# Patient Record
Sex: Female | Born: 1995 | Race: White | Hispanic: No | Marital: Married | Smoking: Never smoker
Health system: Southern US, Community
[De-identification: ages and names within clinical notes are randomized; demographics above are authoritative.]

## PROBLEM LIST (undated history)

## (undated) ENCOUNTER — Inpatient Hospital Stay: Payer: Self-pay

## (undated) DIAGNOSIS — R001 Bradycardia, unspecified: Secondary | ICD-10-CM

## (undated) DIAGNOSIS — F32A Depression, unspecified: Secondary | ICD-10-CM

## (undated) DIAGNOSIS — G43909 Migraine, unspecified, not intractable, without status migrainosus: Secondary | ICD-10-CM

## (undated) DIAGNOSIS — O2243 Hemorrhoids in pregnancy, third trimester: Secondary | ICD-10-CM

## (undated) DIAGNOSIS — F329 Major depressive disorder, single episode, unspecified: Secondary | ICD-10-CM

## (undated) DIAGNOSIS — L409 Psoriasis, unspecified: Secondary | ICD-10-CM

## (undated) DIAGNOSIS — F99 Mental disorder, not otherwise specified: Secondary | ICD-10-CM

## (undated) DIAGNOSIS — K219 Gastro-esophageal reflux disease without esophagitis: Secondary | ICD-10-CM

## (undated) DIAGNOSIS — O24419 Gestational diabetes mellitus in pregnancy, unspecified control: Secondary | ICD-10-CM

## (undated) HISTORY — DX: Psoriasis, unspecified: L40.9

## (undated) HISTORY — DX: Bradycardia, unspecified: R00.1

## (undated) HISTORY — PX: SHOULDER SURGERY: SHX246

## (undated) HISTORY — PX: INNER EAR SURGERY: SHX679

## (undated) HISTORY — DX: Migraine, unspecified, not intractable, without status migrainosus: G43.909

## (undated) HISTORY — DX: Gestational diabetes mellitus in pregnancy, unspecified control: O24.419

---
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
## 2013-03-21 ENCOUNTER — Emergency Department: Payer: Self-pay | Admitting: Emergency Medicine

## 2013-04-13 ENCOUNTER — Emergency Department: Payer: Self-pay | Admitting: Emergency Medicine

## 2013-04-13 LAB — URINALYSIS, COMPLETE
Specific Gravity: 1.024 (ref 1.003–1.030)
Squamous Epithelial: 2
WBC UR: 93 /HPF (ref 0–5)

## 2013-07-08 ENCOUNTER — Ambulatory Visit: Payer: Self-pay | Admitting: Nurse Practitioner

## 2013-08-09 ENCOUNTER — Emergency Department: Payer: Self-pay | Admitting: Emergency Medicine

## 2013-08-09 LAB — URINALYSIS, COMPLETE
BILIRUBIN, UR: NEGATIVE
BLOOD: NEGATIVE
Glucose,UR: NEGATIVE mg/dL (ref 0–75)
Ketone: NEGATIVE
Leukocyte Esterase: NEGATIVE
Nitrite: POSITIVE
PH: 5 (ref 4.5–8.0)
PROTEIN: NEGATIVE
RBC,UR: 1 /HPF (ref 0–5)
Specific Gravity: 1.019 (ref 1.003–1.030)
Squamous Epithelial: <1
WBC UR: 6 /HPF (ref 0–5)

## 2013-08-09 LAB — BASIC METABOLIC PANEL
Anion Gap: 6 — ABNORMAL LOW (ref 7–16)
BUN: 11 mg/dL (ref 7–18)
CO2: 29 mmol/L (ref 21–32)
Calcium, Total: 9.3 mg/dL (ref 9.0–10.7)
Chloride: 104 mmol/L (ref 98–107)
Creatinine: 0.83 mg/dL (ref 0.60–1.30)
EGFR (African American): >60
Glucose: 101 mg/dL — ABNORMAL HIGH (ref 65–99)
OSMOLALITY: 277 (ref 275–301)
POTASSIUM: 3.4 mmol/L — AB (ref 3.5–5.1)
SODIUM: 139 mmol/L (ref 136–145)

## 2013-08-10 LAB — CBC
HCT: 43.2 % (ref 35.0–47.0)
HGB: 14.5 g/dL (ref 12.0–16.0)
MCH: 30.2 pg (ref 26.0–34.0)
MCHC: 33.6 g/dL (ref 32.0–36.0)
MCV: 90 fL (ref 80–100)
PLATELETS: 205 10*3/uL (ref 150–440)
RBC: 4.81 10*6/uL (ref 3.80–5.20)
RDW: 13 % (ref 11.5–14.5)
WBC: 10 10*3/uL (ref 3.6–11.0)

## 2013-08-10 LAB — PREGNANCY, URINE: PREGNANCY TEST, URINE: NEGATIVE m[IU]/mL

## 2013-08-11 LAB — URINE CULTURE

## 2014-04-07 ENCOUNTER — Ambulatory Visit (INDEPENDENT_AMBULATORY_CARE_PROVIDER_SITE_OTHER): Payer: Self-pay | Admitting: Urgent Care

## 2014-04-07 VITALS — BP 112/80 | HR 80 | Temp 98.0°F | Resp 16 | Ht 66.5 in | Wt 179.5 lb

## 2014-04-07 DIAGNOSIS — Z23 Encounter for immunization: Secondary | ICD-10-CM

## 2014-04-07 NOTE — Progress Notes (Signed)
    MRN: [id number] DOB: 12/13/1995  Subjective:   Isabel Higgins is a 19 y.o. female presenting for immunizations for college. Patient states that her school is requiring we fax her immunization record to them, needs her TDap update, last recorded TDap in 2000. Patient has not had meningococcal or Gardasil. Denies any other aggravating or relieving factors, no other questions or concerns.  Isabel Higgins has a current medication list which includes the following prescription(s): ibuprofen and levonorgestrel-ethinyl estradiol.  She is allergic to amoxicillin.  Isabel Higgins  has a past medical history of Bradycardia and Psoriasis. Also  has past surgical history that includes Inner ear surgery and Shoulder surgery.  ROS As in subjective.  Objective:   Vitals: BP 112/80 mmHg  Pulse 80  Temp(Src) 98 F (36.7 C) (Oral)  Resp 16  Ht 5' 6.5" (1.689 m)  Wt 179 lb 8 oz (81.421 kg)  BMI 28.54 kg/m2  SpO2 100%  LMP 03/30/2014  Physical Exam  Constitutional: She is oriented to person, place, and time and well-developed, well-nourished, and in no distress.  Cardiovascular: Normal rate.   Pulmonary/Chest: Effort normal. No respiratory distress.  Neurological: She is alert and oriented to person, place, and time.  Psychiatric: Mood and affect normal.   Assessment and Plan :   1. Need for Tdap vaccination - TDap administered, advised to have meningococcal and Gardasil done through her school or the health department due to financial restraints. - follow up as needed  Wallis BambergMario Jerre Vandrunen, PA-C [facility] [phone] 04/07/2014 6:23 PM

## 2014-04-07 NOTE — Patient Instructions (Addendum)
- Please look into getting the Meningococcal vaccine to help prevent meningitis and also the Gardasil vaccine to help prevent HPV infection through your school or the Health Department.   Tdap Vaccine (Tetanus, Diphtheria, Pertussis): What You Need to Know 1. Why get vaccinated? Tetanus, diphtheria and pertussis can be very serious diseases, even for adolescents and adults. Tdap vaccine can protect us from these diseases. TETANUS (Lockjaw) causes painful muscle tightening and stiffness, usually all over the body.  It can lead to tightening of muscles in the head and neck so you can't open your mouth, swallow, or sometimes even breathe. Tetanus kills about 1 out of 5 people who are infected. DIPHTHERIA can cause a thick coating to form in the back of the throat.  It can lead to breathing problems, paralysis, heart failure, and death. PERTUSSIS (Whooping Cough) causes severe coughing spells, which can cause difficulty breathing, vomiting and disturbed sleep.  It can also lead to weight loss, incontinence, and rib fractures. Up to 2 in 100 adolescents and 5 in 100 adults with pertussis are hospitalized or have complications, which could include pneumonia or death. These diseases are caused by bacteria. Diphtheria and pertussis are spread from person to person through coughing or sneezing. Tetanus enters the body through cuts, scratches, or wounds. Before vaccines, the [place] saw as many as 200,000 cases a year of diphtheria and pertussis, and hundreds of cases of tetanus. Since vaccination began, tetanus and diphtheria have dropped by about 99% and pertussis by about 80%. 2. Tdap vaccine Tdap vaccine can protect adolescents and adults from tetanus, diphtheria, and pertussis. One dose of Tdap is routinely given at age 21 or 9612. People who did not get Tdap at that age should get it as soon as possible. Tdap is especially important for health care professionals and anyone having close contact  with a baby younger than 12 months. Pregnant women should get a dose of Tdap during every pregnancy, to protect the newborn from pertussis. Infants are most at risk for severe, life-threatening complications from pertussis. A similar vaccine, called Td, protects from tetanus and diphtheria, but not pertussis. A Td booster should be given every 10 years. Tdap may be given as one of these boosters if you have not already gotten a dose. Tdap may also be given after a severe cut or burn to prevent tetanus infection. Your doctor can give you more information. Tdap may safely be given at the same time as other vaccines. 3. Some people should not get this vaccine  If you ever had a life-threatening allergic reaction after a dose of any tetanus, diphtheria, or pertussis containing vaccine, OR if you have a severe allergy to any part of this vaccine, you should not get Tdap. Tell your doctor if you have any severe allergies.  If you had a coma, or long or multiple seizures within 7 days after a childhood dose of DTP or DTaP, you should not get Tdap, unless a cause other than the vaccine was found. You can still get Td.  Talk to your doctor if you:  have epilepsy or another nervous system problem,  had severe pain or swelling after any vaccine containing diphtheria, tetanus or pertussis,  ever had Guillain-Barr Syndrome (GBS),  aren't feeling well on the day the shot is scheduled. 4. Risks of a vaccine reaction With any medicine, including vaccines, there is a chance of side effects. These are usually mild and go away on their own, but serious reactions are also  possible. Brief fainting spells can follow a vaccination, leading to injuries from falling. Sitting or lying down for about 15 minutes can help prevent these. Tell your doctor if you feel dizzy or light-headed, or have vision changes or ringing in the ears. Mild problems following Tdap (Did not interfere with activities)  Pain where the shot  was given (about 3 in 4 adolescents or 2 in 3 adults)  Redness or swelling where the shot was given (about 1 person in 5)  Mild fever of at least 100.26F (up to about 1 in 25 adolescents or 1 in 100 adults)  Headache (about 3 or 4 people in 10)  Tiredness (about 1 person in 3 or 4)  Nausea, vomiting, diarrhea, stomach ache (up to 1 in 4 adolescents or 1 in 10 adults)  Chills, body aches, sore joints, rash, swollen glands (uncommon) Moderate problems following Tdap (Interfered with activities, but did not require medical attention)  Pain where the shot was given (about 1 in 5 adolescents or 1 in 100 adults)  Redness or swelling where the shot was given (up to about 1 in 16 adolescents or 1 in 25 adults)  Fever over 102F (about 1 in 100 adolescents or 1 in 250 adults)  Headache (about 3 in 20 adolescents or 1 in 10 adults)  Nausea, vomiting, diarrhea, stomach ache (up to 1 or 3 people in 100)  Swelling of the entire arm where the shot was given (up to about 3 in 100). Severe problems following Tdap (Unable to perform usual activities; required medical attention)  Swelling, severe pain, bleeding and redness in the arm where the shot was given (rare). A severe allergic reaction could occur after any vaccine (estimated less than 1 in a million doses). 5. What if there is a serious reaction? What should I look for?  Look for anything that concerns you, such as signs of a severe allergic reaction, very high fever, or behavior changes. Signs of a severe allergic reaction can include hives, swelling of the face and throat, difficulty breathing, a fast heartbeat, dizziness, and weakness. These would start a few minutes to a few hours after the vaccination. What should I do?  If you think it is a severe allergic reaction or other emergency that can't wait, call 9-1-1 or get the person to the nearest hospital. Otherwise, call your doctor.  Afterward, the reaction should be reported to  the "Vaccine Adverse Event Reporting System" (VAERS). Your doctor might file this report, or you can do it yourself through the VAERS web site at www.vaers.LAgents.no, or by calling 1-630-866-5060. VAERS is only for reporting reactions. They do not give medical advice.  6. The National Vaccine Injury Compensation Program The [employer] Vaccine Injury Compensation Program (VICP) is a federal program that was created to compensate people who may have been injured by certain vaccines. Persons who believe they may have been injured by a vaccine can learn about the program and about filing a claim by calling 1-825-108-4442 or visiting the VICP website at SpiritualWord.at. 7. How can I learn more?  Ask your doctor.  Call your local or state health department.  Contact the Centers for Disease Control and Prevention (CDC):  Call 430-029-7801 or visit CDC's website at PicCapture.uy. CDC Tdap Vaccine VIS (08/01/11) Document Released: 09/10/2011 Document Revised: 07/26/2013 Document Reviewed: 06/23/2013 ExitCare Patient Information 2015 [place], [place]. This information is not intended to replace advice given to you by your health care provider. Make sure you discuss any questions you  have with your health care provider.  

## 2014-04-08 ENCOUNTER — Telehealth: Payer: Self-pay

## 2014-04-08 ENCOUNTER — Telehealth: Payer: Self-pay | Admitting: Urgent Care

## 2014-04-08 NOTE — Telephone Encounter (Signed)
Letter printed to fax to Jacki ConesLaurie at [phone]. Pt advised this has been corrected.

## 2014-04-08 NOTE — Telephone Encounter (Signed)
Error

## 2014-04-08 NOTE — Telephone Encounter (Signed)
Isabel Higgins with the immunization clinic at [facility] states that there may be a typo on the patient's immunization records that were faxed to their office yesterday. She is questioning the date of the patient's MMR vaccine. She states that she is going to fax it back to [place] for our review. Please advise.  [phone]

## 2014-04-08 NOTE — Telephone Encounter (Signed)
Pulled up immunization database [facility] and found that MMR was administered in 1996 not 1995- this was a clerical error on our end.   Pt number: [phone]     

## 2014-11-21 ENCOUNTER — Emergency Department
Admission: EM | Admit: 2014-11-21 | Discharge: 2014-11-21 | Disposition: A | Discharge status: Home / Self Care | Payer: BLUE CROSS/BLUE SHIELD | Attending: Emergency Medicine | Admitting: Emergency Medicine

## 2014-11-21 ENCOUNTER — Encounter: Payer: Self-pay | Admitting: Emergency Medicine

## 2014-11-21 ENCOUNTER — Emergency Department: Payer: BLUE CROSS/BLUE SHIELD

## 2014-11-21 DIAGNOSIS — Z880 Allergy status to penicillin: Secondary | ICD-10-CM | POA: Insufficient documentation

## 2014-11-21 DIAGNOSIS — S4992XA Unspecified injury of left shoulder and upper arm, initial encounter: Secondary | ICD-10-CM | POA: Diagnosis present

## 2014-11-21 DIAGNOSIS — Y9389 Activity, other specified: Secondary | ICD-10-CM | POA: Insufficient documentation

## 2014-11-21 DIAGNOSIS — Y998 Other external cause status: Secondary | ICD-10-CM | POA: Insufficient documentation

## 2014-11-21 DIAGNOSIS — S29002A Unspecified injury of muscle and tendon of back wall of thorax, initial encounter: Secondary | ICD-10-CM | POA: Diagnosis not present

## 2014-11-21 DIAGNOSIS — Y9289 Other specified places as the place of occurrence of the external cause: Secondary | ICD-10-CM | POA: Insufficient documentation

## 2014-11-21 DIAGNOSIS — S96912A Strain of unspecified muscle and tendon at ankle and foot level, left foot, initial encounter: Secondary | ICD-10-CM

## 2014-11-21 DIAGNOSIS — Z79899 Other long term (current) drug therapy: Secondary | ICD-10-CM | POA: Insufficient documentation

## 2014-11-21 DIAGNOSIS — S46912A Strain of unspecified muscle, fascia and tendon at shoulder and upper arm level, left arm, initial encounter: Secondary | ICD-10-CM | POA: Diagnosis not present

## 2014-11-21 MED ORDER — IBUPROFEN 800 MG PO TABS: 800.0000 mg | ORAL_TABLET | Freq: Three times a day (TID) | ORAL | Status: DC | PRN

## 2014-11-21 MED ORDER — IBUPROFEN 800 MG PO TABS
800.0000 mg | ORAL_TABLET | Freq: Once | ORAL | Status: AC
  Administered 2014-11-21: 800 mg via ORAL
  Filled 2014-11-21: qty 1

## 2014-11-21 MED ORDER — CYCLOBENZAPRINE HCL 10 MG PO TABS
5.0000 mg | ORAL_TABLET | Freq: Once | ORAL | Status: AC
  Administered 2014-11-21: 5 mg via ORAL
  Filled 2014-11-21: qty 1

## 2014-11-21 MED ORDER — CYCLOBENZAPRINE HCL 5 MG PO TABS: 5.0000 mg | ORAL_TABLET | Freq: Three times a day (TID) | ORAL | Status: DC | PRN
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
## 2014-11-27 ENCOUNTER — Encounter: Payer: Self-pay | Admitting: Medical Oncology

## 2014-11-27 ENCOUNTER — Emergency Department
Admission: EM | Admit: 2014-11-27 | Discharge: 2014-11-27 | Disposition: A | Discharge status: Home / Self Care | Payer: BLUE CROSS/BLUE SHIELD | Attending: Emergency Medicine | Admitting: Emergency Medicine

## 2014-11-27 ENCOUNTER — Emergency Department: Payer: BLUE CROSS/BLUE SHIELD

## 2014-11-27 DIAGNOSIS — N390 Urinary tract infection, site not specified: Secondary | ICD-10-CM | POA: Insufficient documentation

## 2014-11-27 DIAGNOSIS — Z880 Allergy status to penicillin: Secondary | ICD-10-CM | POA: Diagnosis not present

## 2014-11-27 DIAGNOSIS — Z79899 Other long term (current) drug therapy: Secondary | ICD-10-CM | POA: Insufficient documentation

## 2014-11-27 DIAGNOSIS — I880 Nonspecific mesenteric lymphadenitis: Secondary | ICD-10-CM | POA: Diagnosis not present

## 2014-11-27 DIAGNOSIS — R109 Unspecified abdominal pain: Secondary | ICD-10-CM | POA: Diagnosis present

## 2014-11-27 LAB — CBC
HCT: 43 % (ref 35.0–47.0)
Hemoglobin: 14.9 g/dL (ref 12.0–16.0)
MCH: 30.3 pg (ref 26.0–34.0)
MCHC: 34.7 g/dL (ref 32.0–36.0)
MCV: 87.5 fL (ref 80.0–100.0)
PLATELETS: 180 10*3/uL (ref 150–440)
RBC: 4.91 MIL/uL (ref 3.80–5.20)
RDW: 12.8 % (ref 11.5–14.5)
WBC: 4.9 10*3/uL (ref 3.6–11.0)

## 2014-11-27 LAB — URINALYSIS COMPLETE WITH MICROSCOPIC (ARMC ONLY)
BILIRUBIN URINE: NEGATIVE
GLUCOSE, UA: NEGATIVE mg/dL
HGB URINE DIPSTICK: NEGATIVE
LEUKOCYTES UA: NEGATIVE
NITRITE: NEGATIVE
Protein, ur: 30 mg/dL — AB
SPECIFIC GRAVITY, URINE: 1.026 (ref 1.005–1.030)
pH: 5 (ref 5.0–8.0)

## 2014-11-27 LAB — COMPREHENSIVE METABOLIC PANEL
ALT: 31 U/L (ref 14–54)
AST: 29 U/L (ref 15–41)
Albumin: 4.2 g/dL (ref 3.5–5.0)
Alkaline Phosphatase: 70 U/L (ref 38–126)
Anion gap: 7 (ref 5–15)
BILIRUBIN TOTAL: 1.2 mg/dL (ref 0.3–1.2)
BUN: 11 mg/dL (ref 6–20)
CO2: 23 mmol/L (ref 22–32)
CREATININE: 0.84 mg/dL (ref 0.44–1.00)
Calcium: 9 mg/dL (ref 8.9–10.3)
Chloride: 107 mmol/L (ref 101–111)
GFR calc Af Amer: >60 mL/min (ref >60)
Glucose, Bld: 105 mg/dL — ABNORMAL HIGH (ref 65–99)
POTASSIUM: 3.6 mmol/L (ref 3.5–5.1)
Sodium: 137 mmol/L (ref 135–145)
TOTAL PROTEIN: 7.2 g/dL (ref 6.5–8.1)

## 2014-11-27 LAB — LIPASE, BLOOD: Lipase: 17 U/L — ABNORMAL LOW (ref 22–51)

## 2014-11-27 MED ORDER — ONDANSETRON HCL 4 MG/2ML IJ SOLN
4.0000 mg | Freq: Once | INTRAMUSCULAR | Status: AC
  Administered 2014-11-27: 4 mg via INTRAVENOUS
  Filled 2014-11-27: qty 2

## 2014-11-27 MED ORDER — SULFAMETHOXAZOLE-TRIMETHOPRIM 800-160 MG PO TABS
1.0000 | ORAL_TABLET | Freq: Once | ORAL | Status: AC
  Administered 2014-11-27: 1 via ORAL
  Filled 2014-11-27: qty 1

## 2014-11-27 MED ORDER — IOHEXOL 300 MG/ML  SOLN
100.0000 mL | Freq: Once | INTRAMUSCULAR | Status: AC | PRN
  Administered 2014-11-27: 100 mL via INTRAVENOUS

## 2014-11-27 MED ORDER — MORPHINE SULFATE (PF) 4 MG/ML IV SOLN
4.0000 mg | Freq: Once | INTRAVENOUS | Status: AC
  Administered 2014-11-27: 4 mg via INTRAVENOUS
  Filled 2014-11-27: qty 1

## 2014-11-27 MED ORDER — BACITRACIN ZINC 500 UNIT/GM EX OINT
TOPICAL_OINTMENT | CUTANEOUS | Status: AC
  Filled 2014-11-27: qty 0.9
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
## 2014-11-27 NOTE — Discharge Instructions (Signed)
Antibiotic Medication °Antibiotics are among the most frequently prescribed medicines. Antibiotics cure illness by assisting our body to injure or kill the bacteria that cause infection. While antibiotics are useful to treat a wide variety of infections they are useless against viruses. Antibiotics cannot cure colds, flu, or other viral infections.  °There are many types of antibiotics available. Your caregiver will decide which antibiotic will be useful for an illness. Never take or give someone else's antibiotics or left over medicine. °Your caregiver may also take into account: °· Allergies. °· The cost of the medicine. °· Dosing schedules. °· Taste. °· Common side effects when choosing an antibiotic for an infection. °Ask your caregiver if you have questions about why a certain medicine was chosen. °HOME CARE INSTRUCTIONS °Read all instructions and labels on medicine bottles carefully. Some antibiotics should be taken on an empty stomach while others should be taken with food. Taking antibiotics incorrectly may reduce how well they work. Some antibiotics need to be kept in the refrigerator. Others should be kept at room temperature. Ask your caregiver or pharmacist if you do not understand how to give the medicine. °Be sure to give the amount of medicine your caregiver has prescribed. Even if you feel better and your symptoms improve, bacteria may still remain alive in the body. Taking all of the medicine will prevent: °· The infection from returning and becoming harder to treat. °· Complications from partially treated infections. °If there is any medicine left over after you have taken the medicine as your caregiver has instructed, throw the medicine away. °Be sure to tell your caregiver if you: °· Are allergic to any medicines. °· Are pregnant or intend to become pregnant while using this medicine. °· Are breastfeeding. °· Are taking any other prescription, non-prescription medicine, or herbal  remedies. °· Have any other medical conditions or problems you have not already discussed. °If you are taking birth control pills, they may not work while you are on antibiotics. To avoid unwanted pregnancy: °· Continue taking your birth control pills as usual. °· Use a second form of birth control (such as condoms) while you are taking antibiotic medicine. °· When you finish taking the antibiotic medicine, continue using the second form of birth control until you are finished with your current 1 month cycle of birth control pills. °Try not to miss any doses of medicine. If you miss a dose, take it as soon as possible. However, if it is almost time for the next dose and the dosing schedule is: °· 2 doses a day, take the missed dose and the next dose 5 to 6 hours apart. °· 3 or more doses a day, take the missed dose and the next dose 2 to 4 hours apart, then go back to the normal schedule. °· If you are unable to make up a missed dose, take the next scheduled dose on time and complete the missed dose at the end of the prescribed time for your medicine. °SIDE EFFECTS TO TAKING ANTIBIOTICS °Common side effects to antibiotic use include: °· Soft stools or diarrhea. °· Mild stomach upset. °· Sun sensitivity. °SEEK MEDICAL CARE IF:  °· If you get worse or do not improve within a few days of starting the medicine. °· Vomiting develops. °· Diaper rash or rash on the genitals appears. °· Vaginal itching occurs. °· White patches appear on the tongue or in the mouth. °· Severe watery diarrhea and abdominal cramps occur. °· Signs of an allergy develop (hives, unknown   itchy rash appears). STOP TAKING THE ANTIBIOTIC. °SEEK IMMEDIATE MEDICAL CARE IF:  °· Urine turns dark or blood colored. °· Skin turns yellow. °· Easy bruising or bleeding occurs. °· Joint pain or muscle aches occur. °· Fever returns. °· Severe headache occurs. °· Signs of an allergy develop (trouble breathing, wheezing, swelling of the lips, face or tongue,  fainting, or blisters on the skin or in the mouth). STOP TAKING THE ANTIBIOTIC. °Document Released: 11/22/2003 Document Revised: 06/03/2011 Document Reviewed: 12/01/2008 °[employer]® Patient Information ©2015 [employer]. This information is not intended to replace advice given to you by your health care provider. Make sure you discuss any questions you have with your health care provider. ° °
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
## 2014-11-29 LAB — URINE CULTURE
Culture: >=100000
Special Requests: NORMAL

## 2014-12-01 NOTE — Progress Notes (Signed)
Urine culture from ED growing Enterococcus faecalis for which Dr. Lenard Lance authorized Macrobid 100 mg bid x 7 days on 11/30/14. Attempted to call patient multiple times and left voicemail to determine which pharmacy to call the prescription into. No call back from patient.

## 2015-09-18 LAB — OB RESULTS CONSOLE ABO/RH: RH Type: POSITIVE

## 2015-09-19 LAB — OB RESULTS CONSOLE HEPATITIS B SURFACE ANTIGEN: Hepatitis B Surface Ag: NEGATIVE

## 2015-09-19 LAB — OB RESULTS CONSOLE VARICELLA ZOSTER ANTIBODY, IGG: Varicella: IMMUNE

## 2015-09-19 LAB — OB RESULTS CONSOLE HIV ANTIBODY (ROUTINE TESTING): HIV: NONREACTIVE

## 2015-09-19 LAB — OB RESULTS CONSOLE RUBELLA ANTIBODY, IGM: Rubella: IMMUNE

## 2015-09-19 LAB — OB RESULTS CONSOLE RPR: RPR: NONREACTIVE

## 2015-11-05 ENCOUNTER — Encounter: Payer: Self-pay | Admitting: Emergency Medicine

## 2015-11-05 ENCOUNTER — Emergency Department
Admission: EM | Admit: 2015-11-05 | Discharge: 2015-11-05 | Disposition: A | Discharge status: Home / Self Care | Payer: Medicaid Other | Attending: Emergency Medicine | Admitting: Emergency Medicine

## 2015-11-05 DIAGNOSIS — O26891 Other specified pregnancy related conditions, first trimester: Secondary | ICD-10-CM | POA: Diagnosis not present

## 2015-11-05 DIAGNOSIS — O219 Vomiting of pregnancy, unspecified: Secondary | ICD-10-CM | POA: Diagnosis not present

## 2015-11-05 DIAGNOSIS — R42 Dizziness and giddiness: Secondary | ICD-10-CM | POA: Insufficient documentation

## 2015-11-05 DIAGNOSIS — Z3A12 12 weeks gestation of pregnancy: Secondary | ICD-10-CM | POA: Diagnosis not present

## 2015-11-05 LAB — CBC
HEMATOCRIT: 42.1 % (ref 35.0–47.0)
HEMOGLOBIN: 14.8 g/dL (ref 12.0–16.0)
MCH: 31.5 pg (ref 26.0–34.0)
MCHC: 35.2 g/dL (ref 32.0–36.0)
MCV: 89.7 fL (ref 80.0–100.0)
Platelets: 199 10*3/uL (ref 150–440)
RBC: 4.69 MIL/uL (ref 3.80–5.20)
RDW: 12.8 % (ref 11.5–14.5)
WBC: 10.5 10*3/uL (ref 3.6–11.0)

## 2015-11-05 LAB — COMPREHENSIVE METABOLIC PANEL
ALT: 35 U/L (ref 14–54)
AST: 25 U/L (ref 15–41)
Albumin: 3.7 g/dL (ref 3.5–5.0)
Alkaline Phosphatase: 70 U/L (ref 38–126)
Anion gap: 5 (ref 5–15)
BUN: 7 mg/dL (ref 6–20)
CHLORIDE: 105 mmol/L (ref 101–111)
CO2: 25 mmol/L (ref 22–32)
CREATININE: 0.66 mg/dL (ref 0.44–1.00)
Calcium: 9.3 mg/dL (ref 8.9–10.3)
GFR calc non Af Amer: >60 mL/min (ref >60)
Glucose, Bld: 93 mg/dL (ref 65–99)
Potassium: 3.6 mmol/L (ref 3.5–5.1)
SODIUM: 135 mmol/L (ref 135–145)
Total Bilirubin: 0.3 mg/dL (ref 0.3–1.2)
Total Protein: 6.8 g/dL (ref 6.5–8.1)

## 2015-11-05 LAB — URINALYSIS COMPLETE WITH MICROSCOPIC (ARMC ONLY)
BILIRUBIN URINE: NEGATIVE
Glucose, UA: NEGATIVE mg/dL
HGB URINE DIPSTICK: NEGATIVE
Ketones, ur: NEGATIVE mg/dL
LEUKOCYTES UA: NEGATIVE
NITRITE: NEGATIVE
PH: 7 (ref 5.0–8.0)
Protein, ur: NEGATIVE mg/dL
SPECIFIC GRAVITY, URINE: 1.014 (ref 1.005–1.030)

## 2015-11-05 LAB — LIPASE, BLOOD: LIPASE: 20 U/L (ref 11–51)

## 2015-11-05 LAB — HCG, QUANTITATIVE, PREGNANCY: hCG, Beta Chain, Quant, S: 100210 m[IU]/mL — ABNORMAL HIGH (ref <5)

## 2015-11-05 MED ORDER — METOCLOPRAMIDE HCL 10 MG PO TABS: 10.0000 mg | ORAL_TABLET | Freq: Four times a day (QID) | ORAL | 0 refills | Status: DC | PRN

## 2015-11-05 MED ORDER — ONDANSETRON 8 MG PO TBDP
ORAL_TABLET | ORAL | Status: AC
  Administered 2015-11-05: 8 mg via ORAL
  Filled 2015-11-05: qty 1

## 2015-11-05 MED ORDER — FAMOTIDINE 20 MG PO TABS
ORAL_TABLET | ORAL | Status: AC
  Administered 2015-11-05: 40 mg via ORAL
  Filled 2015-11-05: qty 2

## 2015-11-05 MED ORDER — ONDANSETRON 4 MG PO TBDP: 4.0000 mg | ORAL_TABLET | Freq: Three times a day (TID) | ORAL | 0 refills | Status: DC | PRN

## 2015-11-05 MED ORDER — METOCLOPRAMIDE HCL 10 MG PO TABS
ORAL_TABLET | ORAL | Status: AC
  Administered 2015-11-05: 10 mg via ORAL
  Filled 2015-11-05: qty 1

## 2015-11-05 MED ORDER — FAMOTIDINE 20 MG PO TABS
40.0000 mg | ORAL_TABLET | Freq: Once | ORAL | Status: AC
  Administered 2015-11-05: 40 mg via ORAL

## 2015-11-05 MED ORDER — METOCLOPRAMIDE HCL 10 MG PO TABS
10.0000 mg | ORAL_TABLET | Freq: Once | ORAL | Status: AC
  Administered 2015-11-05: 10 mg via ORAL
  Filled 2015-11-05: qty 1

## 2015-11-05 MED ORDER — ONDANSETRON 4 MG PO TBDP
8.0000 mg | ORAL_TABLET | Freq: Once | ORAL | Status: AC
  Administered 2015-11-05: 8 mg via ORAL

## 2015-11-05 NOTE — ED Provider Notes (Signed)
[facility] Emergency Department Provider Note  ____________________________________________  Time seen: Approximately 6:12 PM  I have reviewed the triage vital signs and the nursing notes.   HISTORY  Chief Complaint Nausea and Emesis    HPI Isabel Higgins is a 20 y.o. female who is about [redacted] weeks pregnant by first trimester dating ultrasound performed by [facility] complains of oral intolerance for the past 3 days. She states that she is nauseated continuously whenever she tries to eat or drink anything she vomits. She's not been able to tolerate even water. She gets dizzy when she stands up and walks around. No syncope or falls or head injuries. She also notes that after vomiting she feels somewhat vertiginous. No vision changes numbness tingling or weakness. Denies medical problems or significant prior surgeries, only surgery 6 years and shoulder.. Taking prenatal vitamins. Follow up with [facility]. Has an appointment with them in 5 days for first trimester genetic screening.     Past Medical History:  Diagnosis Date  . Bradycardia   . Psoriasis      Patient Active Problem List   Diagnosis Date Noted  . Chest pain 03/06/2012  . POTS (postural orthostatic tachycardia syndrome) 03/06/2012     Past Surgical History:  Procedure Laterality Date  . INNER EAR SURGERY     right  . SHOULDER SURGERY     right     Prior to Admission medications   Medication Sig Start Date End Date Taking? Authorizing Provider  cyclobenzaprine (FLEXERIL) 5 MG tablet Take 1 tablet (5 mg total) by mouth every 8 (eight) hours as needed for muscle spasms. 11/21/14   Charmayne Sheer Beers, PA-C  ibuprofen (ADVIL,MOTRIN) 200 MG tablet Take 800 mg by mouth 3 (three) times daily as needed for mild pain or moderate pain.    Historical Provider, MD  ibuprofen (ADVIL,MOTRIN) 800 MG tablet Take 1 tablet (800 mg total) by mouth every 8 (eight) hours as needed. 11/21/14   Evangeline Dakin, PA-C  levonorgestrel-ethinyl estradiol (ORSYTHIA) 0.1-20 MG-MCG tablet Take 1 tablet by mouth daily.    Historical Provider, MD  metoCLOPramide (REGLAN) 10 MG tablet Take 1 tablet (10 mg total) by mouth every 6 (six) hours as needed for nausea. 11/05/15   Sharman Cheek, MD  ondansetron (ZOFRAN ODT) 4 MG disintegrating tablet Take 1 tablet (4 mg total) by mouth every 8 (eight) hours as needed for nausea or vomiting. 11/05/15   Sharman Cheek, MD     Allergies 2,4-d dimethylamine (amisol) and Amoxicillin   Family History  Problem Relation Age of Onset  . Hypertension Father   . Heart attack Maternal Grandmother     x5  . Diabetes Maternal Grandfather   . Diabetes Paternal Grandfather     Social History Social History  Substance Use Topics  . Smoking status: Never Smoker  . Smokeless tobacco: Never Used  . Alcohol use No    Review of Systems  Constitutional:   No fever or chills. Home thermometer remaining afebrile with multiple checks ENT:   No sore throat. No rhinorrhea. Cardiovascular:   No chest pain. Respiratory:   No dyspnea or cough. Gastrointestinal:   Negative for abdominal pain, positive vomiting.  Genitourinary:   No unusual vaginal bleeding or discharge. 2 weeks ago had some dysuria which has resolved.. Musculoskeletal:   Negative for focal pain or swelling Neurological:   Occasional bilateral frontal headaches, none now 10-point ROS otherwise negative.  ____________________________________________   PHYSICAL EXAM:  VITAL SIGNS: ED Triage Vitals [11/05/15 1755]  Enc Vitals Group     BP 129/66     Pulse Rate 89     Resp 18     Temp 98.1 F (36.7 C)     Temp Source Oral     SpO2 99 %     Weight 188 lb (85.3 kg)     Height 5\' 7"  (1.702 m)     Head Circumference      Peak Flow      Pain Score 0     Pain Loc      Pain Edu?      Excl. in GC?     Vital signs reviewed, nursing assessments reviewed.   Constitutional:   Alert and oriented.  Well appearing and in no distress. Eyes:   No scleral icterus. No conjunctival pallor. PERRL. EOMI.  No nystagmus. ENT   Head:   Normocephalic and atraumatic.   Nose:   No congestion/rhinnorhea. No septal hematoma   Mouth/Throat:   MMM, no pharyngeal erythema. No peritonsillar mass.    Neck:   No stridor. No SubQ emphysema. No meningismus. Hematological/Lymphatic/Immunilogical:   No cervical lymphadenopathy. Cardiovascular:   RRR. Symmetric bilateral radial and DP pulses.  No murmurs.  Respiratory:   Normal respiratory effort without tachypnea nor retractions. Breath sounds are clear and equal bilaterally. No wheezes/rales/rhonchi. Gastrointestinal:   Soft and nontender. Non distended. There is no CVA tenderness.  No rebound, rigidity, or guarding.Gravid abdomen, size consistent with dates. Nontender Genitourinary:   deferred Musculoskeletal:   Nontender with normal range of motion in all extremities. No joint effusions.  No lower extremity tenderness.  No edema. Neurologic:   Normal speech and language.  CN 2-10 normal. Motor grossly intact. No gross focal neurologic deficits are appreciated.  Skin:    Skin is warm, dry and intact. No rash noted.  No petechiae, purpura, or bullae.  ____________________________________________    LABS (pertinent positives/negatives) (all labs ordered are listed, but only abnormal results are displayed) Labs Reviewed  URINALYSIS COMPLETEWITH MICROSCOPIC ([facility] ONLY) - Abnormal; Notable for the following:       Result Value   Color, Urine YELLOW (*)    APPearance HAZY (*)    Bacteria, UA RARE (*)    Squamous Epithelial / LPF 0-5 (*)    All other components within normal limits  HCG, QUANTITATIVE, PREGNANCY - Abnormal; Notable for the following:    hCG, Beta Chain, Quant, S 100,210 (*)    All other components within normal limits  LIPASE, BLOOD  COMPREHENSIVE METABOLIC PANEL  CBC    ____________________________________________   EKG    ____________________________________________    RADIOLOGY    ____________________________________________   PROCEDURES Procedures  ____________________________________________   INITIAL IMPRESSION / ASSESSMENT AND PLAN / ED COURSE  Pertinent labs & imaging results that were available during my care of the patient were reviewed by me and considered in my medical decision making (see chart for details).  Patient well appearing no acute distress. Calm and comfortable. Exam is completely benign. We'll try antiemetics and oral hydration while awaiting labs. If she is unable to tolerate by mouth, we can proceed with IV fluid rehydration and relief.    ----------------------------------------- 7:50 PM on 11/05/2015 -----------------------------------------  Since receiving antiemetics, patient has been calm and comfortable watching TV and eating and drinking in the treatment room. He feels much better. We'll prescribe Zofran and Reglan, has appointment in 5 days with [facility], patient will continue to follow  up outpatient.   Clinical Course   ____________________________________________   FINAL CLINICAL IMPRESSION(S) / ED DIAGNOSES  Final diagnoses:  Nausea and vomiting of pregnancy, antepartum       Portions of this note were generated with dragon dictation software. Dictation errors may occur despite best attempts at proofreading.    Sharman CheekPhillip Arnesia Vincelette, MD 11/05/15 [phone]

## 2015-11-05 NOTE — ED Triage Notes (Signed)
Patient arrives from home via POV with complaint of N/V for three days.

## 2015-11-05 NOTE — ED Notes (Signed)
Patient given po liquids and solids and able to keep down

## 2016-01-20 ENCOUNTER — Observation Stay
Admission: EM | Admit: 2016-01-20 | Discharge: 2016-01-20 | Disposition: A | Discharge status: Home / Self Care | Payer: Medicaid Other | Attending: Obstetrics and Gynecology | Admitting: Obstetrics and Gynecology

## 2016-01-20 ENCOUNTER — Encounter: Payer: Self-pay | Admitting: *Deleted

## 2016-01-20 DIAGNOSIS — Z881 Allergy status to other antibiotic agents status: Secondary | ICD-10-CM | POA: Diagnosis not present

## 2016-01-20 DIAGNOSIS — Z3A22 22 weeks gestation of pregnancy: Secondary | ICD-10-CM | POA: Insufficient documentation

## 2016-01-20 DIAGNOSIS — Z8249 Family history of ischemic heart disease and other diseases of the circulatory system: Secondary | ICD-10-CM | POA: Insufficient documentation

## 2016-01-20 DIAGNOSIS — R001 Bradycardia, unspecified: Secondary | ICD-10-CM | POA: Insufficient documentation

## 2016-01-20 DIAGNOSIS — L409 Psoriasis, unspecified: Secondary | ICD-10-CM | POA: Diagnosis not present

## 2016-01-20 DIAGNOSIS — Z888 Allergy status to other drugs, medicaments and biological substances status: Secondary | ICD-10-CM | POA: Diagnosis not present

## 2016-01-20 DIAGNOSIS — R079 Chest pain, unspecified: Secondary | ICD-10-CM | POA: Diagnosis not present

## 2016-01-20 DIAGNOSIS — O26892 Other specified pregnancy related conditions, second trimester: Principal | ICD-10-CM | POA: Insufficient documentation

## 2016-01-20 DIAGNOSIS — Z833 Family history of diabetes mellitus: Secondary | ICD-10-CM | POA: Diagnosis not present

## 2016-01-20 LAB — URINALYSIS COMPLETE WITH MICROSCOPIC (ARMC ONLY)
BILIRUBIN URINE: NEGATIVE
Glucose, UA: 150 mg/dL — AB
HGB URINE DIPSTICK: NEGATIVE
Ketones, ur: NEGATIVE mg/dL
LEUKOCYTES UA: NEGATIVE
Nitrite: NEGATIVE
PH: 7 (ref 5.0–8.0)
Protein, ur: NEGATIVE mg/dL
RBC / HPF: NONE SEEN RBC/hpf (ref 0–5)
Specific Gravity, Urine: 1.008 (ref 1.005–1.030)

## 2016-01-20 NOTE — Discharge Summary (Signed)
DC Summary Discharge Summary   Patient ID: Isabel Higgins [id number] 20 y.o. May 03, 1995  Admit date: 01/20/2016  Discharge date: 01/20/2016  Principal Diagnoses:  1) intrauterine pregnancy at 3640w4d  2) right chest pain  Secondary Diagnoses:  none  Procedures performed during the hospitalization:  NST  HPI: 20 y.o. G1P0 female at 3740w4d who presents with 6 days of right chest pain since having a bout of nausea and emesis over last weekend.  Her nausea has improved. However, her pain has continued. It is sharp, does not radiate. Is made worse by moving. Improved by resting.  No associated symptoms. Pain is described as moderate to severe. Denies fevers, chills, current nausea and vomiting, diarrhea, constipation.  Pain is not associated with eating or not improved with eating.  She notes +FM, no LOF, no VB.  Denies contractions.  Past Medical History:  Diagnosis Date  . Bradycardia   . Psoriasis     Past Surgical History:  Procedure Laterality Date  . INNER EAR SURGERY     right  . SHOULDER SURGERY     right    Allergies  Allergen Reactions  . 2,4-D Dimethylamine (Amisol)     Other reaction(s): Other (See Comments) Other Reaction: BREAK OUT, FACIAL SWELLING, WE  . Amoxicillin Nausea And Vomiting    Social History  Substance Use Topics  . Smoking status: Never Smoker  . Smokeless tobacco: Never Used  . Alcohol use No    Family History  Problem Relation Age of Onset  . Hypertension Father   . Diabetes Maternal Grandfather   . Heart attack Maternal Grandmother     x5  . Diabetes [facility]     Hospital Course:  Admitted for observation.  Urinalysis negative.  Exam shows pain along right lower anterior ribs.  Negative Murphy's sign. No tenderness over RUQ.  Pain along right chest wall.  No shortness of breath.  Patient monitored with no concerns for DVT, cholecystitis, labor, and gastritis.  Diagnosed with unexplained chest wall pain with no  concerning findings.  Discharged with precautions for shortness of breath, worsening chest pain, RUQ pain, nausea, fevers, chills, labor symptoms.    Discharge Exam: LMP 08/15/2015  General  no apparent distress   CV  RRR   Pulmonary  clear to ausculatation bllaterally, tenderness on chest along right anterior ribs in the lower chest. No overlying skin changes.  Abdomen  Soft, non-tender, non-distended, neg Murphy's sign.   Extremities  no edema, symmetric, SCDs in place    Condition at Discharge: Stable  Complications affecting treatment: None  Discharge Medications:    Medication List    STOP taking these medications   cyclobenzaprine 5 MG tablet Commonly known as:  FLEXERIL   ibuprofen 200 MG tablet Commonly known as:  ADVIL,MOTRIN   ibuprofen 800 MG tablet Commonly known as:  ADVIL,MOTRIN   metoCLOPramide 10 MG tablet Commonly known as:  REGLAN   ondansetron 4 MG disintegrating tablet Commonly known as:  ZOFRAN ODT   ORSYTHIA 0.1-20 MG-MCG tablet Generic drug:  levonorgestrel-ethinyl estradiol      Follow-up arrangements:  Follow-up Information    [facility] week(s).   Why:  f/u right rib pain Contact information: [address] [place] [address] [phone]           Discharge Disposition: Home to self care  Signed: Conard NovakJackson, Patrisia Faeth D, MD 01/20/2016 2:43 PM

## 2016-01-20 NOTE — Progress Notes (Signed)
Discharge instructions given and explained.  Questions answered.  Verbalized understanding.  Signed copy on chart.  Work note give.

## 2016-01-20 NOTE — OB Triage Note (Signed)
Recvd per wheelchair to OBS 3 with c/o abdominal pain that started Sunday 10/22.  States that she has had frequent n/v and feels as if she has pulled a muscle.  Changed to gown and to bed.  EFM applied.  Plan of care discussed.

## 2016-01-20 NOTE — Progress Notes (Signed)
Off monitor - up to dress for discharge.

## 2016-01-23 ENCOUNTER — Emergency Department
Admission: EM | Admit: 2016-01-23 | Discharge: 2016-01-23 | Disposition: A | Discharge status: Home / Self Care | Payer: Medicaid Other | Attending: Emergency Medicine | Admitting: Emergency Medicine

## 2016-01-23 ENCOUNTER — Encounter: Payer: Self-pay | Admitting: Emergency Medicine

## 2016-01-23 ENCOUNTER — Emergency Department: Payer: Medicaid Other

## 2016-01-23 DIAGNOSIS — Z3A23 23 weeks gestation of pregnancy: Secondary | ICD-10-CM | POA: Insufficient documentation

## 2016-01-23 DIAGNOSIS — R0789 Other chest pain: Secondary | ICD-10-CM | POA: Diagnosis not present

## 2016-01-23 DIAGNOSIS — O26892 Other specified pregnancy related conditions, second trimester: Secondary | ICD-10-CM | POA: Insufficient documentation

## 2016-01-23 LAB — URINALYSIS COMPLETE WITH MICROSCOPIC (ARMC ONLY)
Bilirubin Urine: NEGATIVE
Glucose, UA: 50 mg/dL — AB
HGB URINE DIPSTICK: NEGATIVE
KETONES UR: NEGATIVE mg/dL
NITRITE: NEGATIVE
PH: 6 (ref 5.0–8.0)
PROTEIN: NEGATIVE mg/dL
SPECIFIC GRAVITY, URINE: 1.015 (ref 1.005–1.030)

## 2016-01-23 LAB — BASIC METABOLIC PANEL
ANION GAP: 8 (ref 5–15)
BUN: 6 mg/dL (ref 6–20)
CHLORIDE: 105 mmol/L (ref 101–111)
CO2: 23 mmol/L (ref 22–32)
CREATININE: 0.49 mg/dL (ref 0.44–1.00)
Calcium: 9 mg/dL (ref 8.9–10.3)
GFR calc non Af Amer: >60 mL/min (ref >60)
Glucose, Bld: 104 mg/dL — ABNORMAL HIGH (ref 65–99)
POTASSIUM: 3.3 mmol/L — AB (ref 3.5–5.1)
Sodium: 136 mmol/L (ref 135–145)

## 2016-01-23 LAB — CBC
HEMATOCRIT: 38.5 % (ref 35.0–47.0)
HEMOGLOBIN: 13 g/dL (ref 12.0–16.0)
MCH: 31.2 pg (ref 26.0–34.0)
MCHC: 33.7 g/dL (ref 32.0–36.0)
MCV: 92.8 fL (ref 80.0–100.0)
Platelets: 193 10*3/uL (ref 150–440)
RBC: 4.15 MIL/uL (ref 3.80–5.20)
RDW: 14 % (ref 11.5–14.5)
WBC: 13.6 10*3/uL — ABNORMAL HIGH (ref 3.6–11.0)

## 2016-01-23 MED ORDER — GI COCKTAIL ~~LOC~~
ORAL | Status: AC
  Filled 2016-01-23: qty 30

## 2016-01-23 MED ORDER — GI COCKTAIL ~~LOC~~
30.0000 mL | Freq: Once | ORAL | Status: AC
  Administered 2016-01-23: 30 mL via ORAL

## 2016-01-23 NOTE — ED Triage Notes (Addendum)
Pt presents to ED with reports of right sided rib pain. Pt is [redacted] weeks pregnant. Pt states over a week ago she had a lot of vomiting and her ribs have been hurting since then. Pt denies vomiting. [facility] and sent here for evaluation. Pt in no apparent distress in triage.

## 2016-01-23 NOTE — ED Provider Notes (Signed)
[facility] Emergency Department Provider Note   ____________________________________________   First MD Initiated Contact with Patient 01/23/16 1648     (approximate)  I have reviewed the triage vital signs and the nursing notes.   HISTORY  Chief Complaint rib pain    HPI Isabel Higgins is a 20 y.o. female who reports she is [redacted] weeks pregnant. She says last week she had an episode of a lot of vomiting and dry heaves lasted 15-20 minutes her little bit longer in the morning. Since then she's been having pain in her ribs under the right breast. Pain is worse with deep breathing. The pain is worse with palpation or movement. Patient denies any coughing fever shortness of breath or any other problems. Patient has had no leg swelling. Patient reports she went to see the doctor and was put on rest in a recliner after 3 or 4 days of resting and recliner almost all day long developed numbness in the anterior right thigh. She has no pain or swelling in either leg however. She has no rash or bruising. Pain is moderate.   Past Medical History:  Diagnosis Date  . Bradycardia   . Psoriasis     Patient Active Problem List   Diagnosis Date Noted  . Labor and delivery, indication for care 01/20/2016  . Chest pain 03/06/2012  . POTS (postural orthostatic tachycardia syndrome) 03/06/2012    Past Surgical History:  Procedure Laterality Date  . INNER EAR SURGERY     right  . SHOULDER SURGERY     right    Prior to Admission medications   Not on File    Allergies 2,4-d dimethylamine (amisol) and Amoxicillin  Family History  Problem Relation Age of Onset  . Hypertension Father   . Diabetes Maternal Grandfather   . Heart attack Maternal Grandmother     x5  . Diabetes Paternal Grandfather     Social History Social History  Substance Use Topics  . Smoking status: Never Smoker  . Smokeless tobacco: Never Used  . Alcohol use No    Review of  Systems  Constitutional: No fever/chills Eyes: No visual changes. ENT: No sore throat. Cardiovascular: See history of present illness Respiratory: Denies shortness of breath. Gastrointestinal: No abdominal pain.  No nausea, no vomiting at present.  No diarrhea.  No constipation. Genitourinary: Negative for dysuria. Musculoskeletal: Negative for back pain. Skin: Negative for rash. Neurological: Negative for headaches, focal weakness or numbness.  10-point ROS otherwise negative.  ____________________________________________   PHYSICAL EXAM:  VITAL SIGNS: ED Triage Vitals  Enc Vitals Group     BP 01/23/16 1541 124/64     Pulse Rate 01/23/16 1541 86     Resp 01/23/16 1541 18     Temp 01/23/16 1541 97.9 F (36.6 C)     Temp Source 01/23/16 1541 Oral     SpO2 01/23/16 1541 98 %     Weight 01/23/16 1542 208 lb 8 oz (94.6 kg)     Height 01/23/16 1542 5\' 7"  (1.702 m)     Head Circumference --      Peak Flow --      Pain Score 01/23/16 1543 7     Pain Loc --      Pain Edu? --      Excl. in GC? --     Constitutional: Alert and oriented. Well appearing and in no acute distress. Eyes: Conjunctivae are normal. PERRL. EOMI. Head: Atraumatic. Nose: No congestion/rhinnorhea. Mouth/Throat:  Mucous membranes are moist.  Oropharynx non-erythematous. Neck: No stridor. Cardiovascular: Normal rate, regular rhythm. Grossly normal heart sounds.  Good peripheral circulation. Respiratory: Normal respiratory effort.  No retractions. Lungs CTAB.Chest wall is tender to palpation under the right breast. Palpation exactly reproduces her pain. Gastrointestinal: Soft and nontender. No distention. No abdominal bruits. No CVA tenderness. Musculoskeletal: No lower extremity tenderness nor edema.  No joint effusions. Neurologic:  Normal speech and language. No gross focal neurologic deficits are appreciated. No gait instability. Skin:  Skin is warm, dry and intact. No rash noted. Psychiatric: Mood and  affect are normal. Speech and behavior are normal.  ____________________________________________   LABS (all labs ordered are listed, but only abnormal results are displayed)  Labs Reviewed  CBC - Abnormal; Notable for the following:       Result Value   WBC 13.6 (*)    All other components within normal limits  BASIC METABOLIC PANEL - Abnormal; Notable for the following:    Potassium 3.3 (*)    Glucose, Bld 104 (*)    All other components within normal limits  URINALYSIS COMPLETEWITH MICROSCOPIC ([facility] ONLY) - Abnormal; Notable for the following:    Color, Urine YELLOW (*)    APPearance CLEAR (*)    Glucose, UA 50 (*)    Leukocytes, UA TRACE (*)    Bacteria, UA MANY (*)    Squamous Epithelial / LPF 0-5 (*)    All other components within normal limits   ____________________________________________  EKG  EKG read and interpreted by me shows normal sinus rhythm rate of 73 normal axis normal EKG ____________________________________________  RADIOLOGY  Study Result   CLINICAL DATA:  Pleuritic chest pain, patient is pregnant  EXAM: BILATERAL LOWER EXTREMITY VENOUS DOPPLER ULTRASOUND  TECHNIQUE: Gray-scale sonography with graded compression, as well as color Doppler and duplex ultrasound were performed to evaluate the lower extremity deep venous systems from the level of the common femoral vein and including the common femoral, femoral, profunda femoral, popliteal and calf veins including the posterior tibial, peroneal and gastrocnemius veins when visible. The superficial great saphenous vein was also interrogated. Spectral Doppler was utilized to evaluate flow at rest and with distal augmentation maneuvers in the common femoral, femoral and popliteal veins.  COMPARISON:  None.  FINDINGS: RIGHT LOWER EXTREMITY  Common Femoral Vein: No evidence of thrombus. Normal compressibility, respiratory phasicity and response to augmentation.  Saphenofemoral Junction:  No evidence of thrombus. Normal compressibility and flow on color Doppler imaging.  Profunda Femoral Vein: No evidence of thrombus. Normal compressibility and flow on color Doppler imaging.  Femoral Vein: No evidence of thrombus. Normal compressibility, respiratory phasicity and response to augmentation.  Popliteal Vein: No evidence of thrombus. Normal compressibility, respiratory phasicity and response to augmentation.  Calf Veins: No evidence of thrombus. Normal compressibility and flow on color Doppler imaging.  Superficial Great Saphenous Vein: No evidence of thrombus. Normal compressibility and flow on color Doppler imaging.  LEFT LOWER EXTREMITY  Common Femoral Vein: No evidence of thrombus. Normal compressibility, respiratory phasicity and response to augmentation.  Saphenofemoral Junction: No evidence of thrombus. Normal compressibility and flow on color Doppler imaging.  Profunda Femoral Vein: No evidence of thrombus. Normal compressibility and flow on color Doppler imaging.  Femoral Vein: No evidence of thrombus. Normal compressibility, respiratory phasicity and response to augmentation.  Popliteal Vein: No evidence of thrombus. Normal compressibility, respiratory phasicity and response to augmentation.  Calf Veins: No evidence of thrombus. Normal compressibility and flow on color Doppler  imaging.  Superficial Great Saphenous Vein: No evidence of thrombus. Normal compressibility and flow on color Doppler imaging.  IMPRESSION: No evidence of deep venous thrombosis.   Electronically Signed   By: Jasmine PangKim  Fujinaga M.D.   On: 01/23/2016 18:33     ____________________________________________   PROCEDURES  Procedure(s) performed:  Procedures  Critical Care performed:   ____________________________________________   INITIAL IMPRESSION / ASSESSMENT AND PLAN / ED COURSE  Pertinent labs & imaging results that were available during my  care of the patient were reviewed by me and considered in my medical decision making (see chart for details).    Clinical Course      ____________________________________________   FINAL CLINICAL IMPRESSION(S) / ED DIAGNOSES  Final diagnoses:  Chest wall pain      NEW MEDICATIONS STARTED DURING THIS VISIT:  There are no discharge medications for this patient.    Note:  This document was prepared using Dragon voice recognition software and may include unintentional dictation errors.    Arnaldo NatalPaul F Kiamesha Samet, MD 02/01/16 [phone]

## 2016-01-23 NOTE — Discharge Instructions (Signed)
Use Tylenol for the pain. For severe pain you can take Vicodin one pill 4 times a day. Be careful can make you sleepy and constipated. It's felt to be safe if needed for bad pain in pregnancy. Please understand but no medicine is completely safe in pregnancy.

## 2016-01-23 NOTE — ED Notes (Signed)
Assisted pt. To toilet in tx room. No complications.

## 2016-02-06 ENCOUNTER — Observation Stay
Admission: EM | Admit: 2016-02-06 | Discharge: 2016-02-06 | Disposition: A | Discharge status: Home / Self Care | Payer: Medicaid Other | Attending: Obstetrics and Gynecology | Admitting: Obstetrics and Gynecology

## 2016-02-06 DIAGNOSIS — Z79899 Other long term (current) drug therapy: Secondary | ICD-10-CM | POA: Diagnosis not present

## 2016-02-06 DIAGNOSIS — O26892 Other specified pregnancy related conditions, second trimester: Secondary | ICD-10-CM | POA: Insufficient documentation

## 2016-02-06 DIAGNOSIS — O99412 Diseases of the circulatory system complicating pregnancy, second trimester: Secondary | ICD-10-CM | POA: Diagnosis not present

## 2016-02-06 DIAGNOSIS — O4692 Antepartum hemorrhage, unspecified, second trimester: Principal | ICD-10-CM | POA: Insufficient documentation

## 2016-02-06 DIAGNOSIS — L409 Psoriasis, unspecified: Secondary | ICD-10-CM | POA: Insufficient documentation

## 2016-02-06 DIAGNOSIS — Z3A25 25 weeks gestation of pregnancy: Secondary | ICD-10-CM | POA: Insufficient documentation

## 2016-02-06 DIAGNOSIS — I498 Other specified cardiac arrhythmias: Secondary | ICD-10-CM | POA: Diagnosis not present

## 2016-02-06 DIAGNOSIS — O4690 Antepartum hemorrhage, unspecified, unspecified trimester: Secondary | ICD-10-CM | POA: Diagnosis present

## 2016-02-06 DIAGNOSIS — Z8249 Family history of ischemic heart disease and other diseases of the circulatory system: Secondary | ICD-10-CM | POA: Insufficient documentation

## 2016-02-06 DIAGNOSIS — R000 Tachycardia, unspecified: Secondary | ICD-10-CM | POA: Insufficient documentation

## 2016-02-06 DIAGNOSIS — Z833 Family history of diabetes mellitus: Secondary | ICD-10-CM | POA: Insufficient documentation

## 2016-02-06 DIAGNOSIS — Z888 Allergy status to other drugs, medicaments and biological substances status: Secondary | ICD-10-CM | POA: Insufficient documentation

## 2016-02-06 DIAGNOSIS — Z881 Allergy status to other antibiotic agents status: Secondary | ICD-10-CM | POA: Insufficient documentation

## 2016-02-06 NOTE — Discharge Summary (Signed)
Obstetric History and Physical  Isabel Higgins is a 20 y.o. G1P0 with Estimated Date of Delivery: 05/21/16 per LMP and 6 wk US who presents at 22148w0d for evaluation of bleeding episode. Pt had bowel movement recently and noted a large amount of blood on tissue and in toilet. Patient states she has been having no contractions, intact membranes, with active fetal movement.    Prenatal Course Source of Care: [facility]  with onset of care at 5 weeks Pregnancy complications or risks: Placenta anterior per 20 wk US BMI 30 with normal early 1 hr Normal cardiac workup for dizzyness  Patient Active Problem List   Diagnosis Date Noted  . Vaginal bleeding in pregnancy 02/06/2016  . Labor and delivery, indication for care 01/20/2016  . Chest pain 03/06/2012  . POTS (postural orthostatic tachycardia syndrome) 03/06/2012     Prenatal Transfer Tool   Past Medical History:  Diagnosis Date  . Bradycardia   . Psoriasis     Past Surgical History:  Procedure Laterality Date  . INNER EAR SURGERY     right  . SHOULDER SURGERY     right    OB History  Gravida Para Term Preterm AB Living  1            SAB TAB Ectopic Multiple Live Births               # Outcome Date GA Lbr Len/2nd Weight Sex Delivery Anes PTL Lv  1 Current               Social History   Social History  . Marital status: Single    Spouse name: N/A  . Number of children: N/A  . Years of education: N/A   Social History Main Topics  . Smoking status: Never Smoker  . Smokeless tobacco: Never Used  . Alcohol use No  . Drug use: No  . Sexual activity: Not Asked   Other Topics Concern  . None   Social History Narrative  . None    Family History  Problem Relation Age of Onset  . Hypertension Father   . Diabetes Maternal Grandfather   . Heart attack Maternal Grandmother     x5  . Diabetes Paternal Grandfather     Prescriptions Prior to Admission  Medication Sig Dispense Refill Last Dose  . calcium carbonate  (TUMS - DOSED IN MG ELEMENTAL CALCIUM) 500 MG chewable tablet Chew 1 tablet by mouth daily.   02/06/2016 at Unknown time  . famotidine (PEPCID) 20 MG tablet Take 20 mg by mouth 2 (two) times daily.   02/06/2016 at Unknown time  . Prenatal Vit-Fe Fumarate-FA (PRENATAL MULTIVITAMIN) TABS tablet Take 1 tablet by mouth daily at 12 noon.   02/06/2016 at Unknown time    Allergies  Allergen Reactions  . 2,4-D Dimethylamine (Amisol)     Other reaction(s): Other (See Comments) Other Reaction: BREAK OUT, FACIAL SWELLING, WE  . Amoxicillin Nausea And Vomiting    Review of Systems: Negative except for what is mentioned in HPI.  Physical Exam: BP 117/66   Pulse 91   Temp 98.4 F (36.9 C) (Oral)   Resp 16   LMP 08/15/2015  GENERAL: Well-developed, well-nourished female in no acute distress.  LUNGS: unlabored breathing ABDOMEN: Soft, nontender, nondistended, gravid. EXTREMITIES: Nontender, no edema Pelvic exam: normal leukorrhea, no pink or brown discharge noted. Anus: no external hemorroid or bleeding noted FHT: reactive for gestational age Contractions: none Urine without evidence of bleeding  Pertinent Labs/Studies:   No results found for this or any previous visit (from the past 24 hour(s)).  Assessment : IUP at 2216w0d, no vaginal bleeding  Plan: Discharge Home  Bleeding likely rectal - pt to continue to monitor and report if episode recurs. GI referral prn.

## 2016-03-25 NOTE — L&D Delivery Note (Signed)
Estimated Date of Delivery: 05/21/16 Patient's last menstrual period was 08/15/2015. EGA: 5273w5d  Delivery Note At 1:11 AM a viable female was delivered via Vaginal, Spontaneous Delivery (Presentation: OA; ROA).  APGAR: 8, 9; weight: 4460g.   Placenta status: spontaneous, intact.  Cord:  with the following complications: none.  Cord pH: NA  Called to see patient.  Mom pushed to deliver a viable female infant.  The head followed by shoulders, which delivered without difficulty, and the rest of the body.  No nuchal cord noted.  Baby to mom's chest.  Cord clamped and cut after > 3 min delay.  No cord blood obtained.  Placenta delivered spontaneously, intact, with a 3-vessel cord.  Perineum intact. Right labial scrape hemostatic without repair.  All counts correct.  Hemostasis obtained with IV pitocin and fundal massage.  Anesthesia:  epidural Episiotomy: None Lacerations:  Labial hemostatic Suture Repair: NA Est. Blood Loss (mL):  350  Mom to postpartum.  Baby to Couplet care / Skin to Skin.  Isabel Higgins, CNM 05/05/2016, 1:29 AM

## 2016-03-31 ENCOUNTER — Observation Stay
Admission: EM | Admit: 2016-03-31 | Discharge: 2016-03-31 | Disposition: A | Discharge status: Home / Self Care | Payer: Medicaid Other | Attending: Obstetrics and Gynecology | Admitting: Obstetrics and Gynecology

## 2016-03-31 ENCOUNTER — Encounter: Payer: Self-pay | Admitting: *Deleted

## 2016-03-31 DIAGNOSIS — Z3A32 32 weeks gestation of pregnancy: Secondary | ICD-10-CM | POA: Insufficient documentation

## 2016-03-31 DIAGNOSIS — Z881 Allergy status to other antibiotic agents status: Secondary | ICD-10-CM | POA: Insufficient documentation

## 2016-03-31 DIAGNOSIS — O479 False labor, unspecified: Secondary | ICD-10-CM | POA: Diagnosis present

## 2016-03-31 HISTORY — DX: Mental disorder, not otherwise specified: F99

## 2016-03-31 HISTORY — DX: Depression, unspecified: F32.A

## 2016-03-31 HISTORY — DX: Major depressive disorder, single episode, unspecified: F32.9

## 2016-03-31 NOTE — Discharge Summary (Signed)
See final progress note. 

## 2016-03-31 NOTE — Final Progress Note (Addendum)
Physician Final Progress Note  Patient ID: Isabel LullKatelyn L George MRN: [id number] DOB/AGE: 23/08/1995 23 y.o.  Admit date: 03/31/2016 Admitting provider: Vena AustriaAndreas Glade Strausser, MD Discharge date: 03/31/2016   Admission Diagnoses: Irregular contractions  Discharge Diagnoses:  Active Problems:   Irregular contractions  21 yo G1P0000 at 4055w5d with irregular contractions, no contractions on tocometer, and closed cervix  Consults: None  Significant Findings/ Diagnostic Studies: none  Procedures: NST 140, moderate, +accels, no decels.  No contractions on tocometer  Discharge Condition: good  Disposition: 01-Home or Self Care  Diet: Regular diet  Discharge Activity: Activity as tolerated  Discharge Instructions    Discharge activity:  No Restrictions    Complete by:  As directed    Fetal Kick Count:  Lie on our left side for one hour after a meal, and count the number of times your baby kicks.  If it is less than 5 times, get up, move around and drink some juice.  Repeat the test 30 minutes later.  If it is still less than 5 kicks in an hour, notify your doctor.    Complete by:  As directed    No sexual activity restrictions    Complete by:  As directed    Notify physician for a general feeling that "something is not right"    Complete by:  As directed    Notify physician for increase or change in vaginal discharge    Complete by:  As directed    Notify physician for intestinal cramps, with or without diarrhea, sometimes described as "gas pain"    Complete by:  As directed    Notify physician for leaking of fluid    Complete by:  As directed    Notify physician for low, dull backache, unrelieved by heat or Tylenol    Complete by:  As directed    Notify physician for menstrual like cramps    Complete by:  As directed    Notify physician for pelvic pressure    Complete by:  As directed    Notify physician for uterine contractions.  These may be painless and feel like the uterus is  tightening or the baby is  "balling up"    Complete by:  As directed    Notify physician for vaginal bleeding    Complete by:  As directed    PRETERM LABOR:  Includes any of the follwing symptoms that occur between 20 - [redacted] weeks gestation.  If these symptoms are not stopped, preterm labor can result in preterm delivery, placing your baby at risk    Complete by:  As directed      Allergies as of 03/31/2016      Reactions   Amoxicillin Nausea And Vomiting      Medication List    TAKE these medications   calcium carbonate 500 MG chewable tablet Commonly known as:  TUMS - dosed in mg elemental calcium Chew 1 tablet by mouth daily.   famotidine 20 MG tablet Commonly known as:  PEPCID Take 20 mg by mouth 2 (two) times daily.   prenatal multivitamin Tabs tablet Take 1 tablet by mouth daily at 12 noon.        Total time spent taking care of this patient: 30 minutes  Signed: Lorrene ReidSTAEBLER, Zaley Talley M 03/31/2016, 3:07 PM

## 2016-03-31 NOTE — OB Triage Note (Signed)
C/o lower abdominal pain around 1100 today. Seems worse when in sitting position vs laying down. Denies any LOF. Pain seems to have slowed down. Isabel Higgins, [name]

## 2016-04-05 ENCOUNTER — Observation Stay
Admission: EM | Admit: 2016-04-05 | Discharge: 2016-04-06 | Disposition: A | Discharge status: Home / Self Care | Payer: Medicaid Other | Attending: Obstetrics and Gynecology | Admitting: Obstetrics and Gynecology

## 2016-04-05 DIAGNOSIS — Z880 Allergy status to penicillin: Secondary | ICD-10-CM | POA: Insufficient documentation

## 2016-04-05 DIAGNOSIS — O26893 Other specified pregnancy related conditions, third trimester: Principal | ICD-10-CM | POA: Insufficient documentation

## 2016-04-05 DIAGNOSIS — Z3A33 33 weeks gestation of pregnancy: Secondary | ICD-10-CM | POA: Insufficient documentation

## 2016-04-05 DIAGNOSIS — R609 Edema, unspecified: Secondary | ICD-10-CM | POA: Diagnosis present

## 2016-04-05 NOTE — OB Triage Note (Addendum)
Patient complaining of bilateral hand and feet swelling x1 day. Patient denies visual changes, RUQ pain, states she had a headache earlier today but not anymore. Pulses normal, negative clonus and reflexes WNL. Monitors applied

## 2016-04-06 DIAGNOSIS — O26893 Other specified pregnancy related conditions, third trimester: Secondary | ICD-10-CM | POA: Diagnosis not present

## 2016-04-06 DIAGNOSIS — R609 Edema, unspecified: Secondary | ICD-10-CM | POA: Diagnosis present

## 2016-04-06 DIAGNOSIS — Z880 Allergy status to penicillin: Secondary | ICD-10-CM | POA: Diagnosis not present

## 2016-04-06 DIAGNOSIS — Z3A33 33 weeks gestation of pregnancy: Secondary | ICD-10-CM | POA: Diagnosis not present

## 2016-04-06 NOTE — Discharge Instructions (Signed)
Drink plenty of fluids, get plenty of rest

## 2016-04-06 NOTE — OB Triage Note (Signed)
Discharge instructions reviewed with patient and significant other. Patient denies questions at this time. Ambulatory off unit at this time without difficulty.

## 2016-04-06 NOTE — Discharge Summary (Signed)
See final progress note. 

## 2016-04-06 NOTE — Final Progress Note (Signed)
Physician Final Progress Note  Patient ID: Isabel Higgins MRN: [id number] DOB/AGE: Mar 23, 1996 21 y.o.  Admit date: 04/05/2016 Admitting provider: Vena Austria, MD Discharge date: 04/06/2016   Admission Diagnoses: Swelling  Discharge Diagnoses:  Active Problems:   Swelling  21 yo G1 at [redacted]w[redacted]d presenting to L&D with complaint of increasing edema.  Normotensive on presentationm and has been in clinic thus far this pregnancy.    Vitals:   04/05/16 0700 04/05/16 2347 04/05/16 2357 04/06/16 0012  BP:  135/78 134/84 124/79  Pulse:  (!) 120 (!) 114 (!) 111  Resp:  18 16   Temp: 98.1 F (36.7 C)     TempSrc: Oral        Consults: None  Significant Findings/ Diagnostic Studies: none  Procedures: NST 140, moderate, +accels, no decels  Reactive category I tracing.  Toco: q5-65minutes patient not feeling these on a regular absis  Discharge Condition: good  Disposition: 01-Home or Self Care  Diet: Regular diet  Discharge Activity: Activity as tolerated  Discharge Instructions    Discharge activity:  No Restrictions    Complete by:  As directed    Discharge diet:  No restrictions    Complete by:  As directed    Fetal Kick Count:  Lie on our left side for one hour after a meal, and count the number of times your baby kicks.  If it is less than 5 times, get up, move around and drink some juice.  Repeat the test 30 minutes later.  If it is still less than 5 kicks in an hour, notify your doctor.    Complete by:  As directed    LABOR:  When conractions begin, you should start to time them from the beginning of one contraction to the beginning  of the next.  When contractions are 5 - 10 minutes apart or less and have been regular for at least an hour, you should call your health care provider.    Complete by:  As directed    No sexual activity restrictions    Complete by:  As directed    Notify physician for bleeding from the vagina    Complete by:  As directed    Notify  physician for blurring of vision or spots before the eyes    Complete by:  As directed    Notify physician for chills or fever    Complete by:  As directed    Notify physician for fainting spells, "black outs" or loss of consciousness    Complete by:  As directed    Notify physician for increase in vaginal discharge    Complete by:  As directed    Notify physician for leaking of fluid    Complete by:  As directed    Notify physician for pain or burning when urinating    Complete by:  As directed    Notify physician for pelvic pressure (sudden increase)    Complete by:  As directed    Notify physician for severe or continued nausea or vomiting    Complete by:  As directed    Notify physician for sudden gushing of fluid from the vagina (with or without continued leaking)    Complete by:  As directed    Notify physician for sudden, constant, or occasional abdominal pain    Complete by:  As directed    Notify physician if baby moving less than usual    Complete by:  As directed  Allergies as of 04/06/2016      Reactions   Amoxicillin Nausea And Vomiting      Medication List    TAKE these medications   calcium carbonate 500 MG chewable tablet Commonly known as:  TUMS - dosed in mg elemental calcium Chew 1 tablet by mouth daily.   famotidine 20 MG tablet Commonly known as:  PEPCID Take 20 mg by mouth 2 (two) times daily.   prenatal multivitamin Tabs tablet Take 1 tablet by mouth daily at 12 noon.        Total time spent taking care of this patient: 20 minutes  Signed: Lorrene ReidSTAEBLER, Clyda Smyth M 04/06/2016, 12:13 AM

## 2016-04-09 ENCOUNTER — Observation Stay
Admission: EM | Admit: 2016-04-09 | Discharge: 2016-04-10 | Disposition: A | Discharge status: Home / Self Care | Payer: Medicaid Other | Attending: Advanced Practice Midwife | Admitting: Advanced Practice Midwife

## 2016-04-09 DIAGNOSIS — Z6830 Body mass index (BMI) 30.0-30.9, adult: Secondary | ICD-10-CM | POA: Diagnosis not present

## 2016-04-09 DIAGNOSIS — Z833 Family history of diabetes mellitus: Secondary | ICD-10-CM | POA: Diagnosis not present

## 2016-04-09 DIAGNOSIS — K219 Gastro-esophageal reflux disease without esophagitis: Secondary | ICD-10-CM | POA: Insufficient documentation

## 2016-04-09 DIAGNOSIS — O9989 Other specified diseases and conditions complicating pregnancy, childbirth and the puerperium: Secondary | ICD-10-CM | POA: Diagnosis not present

## 2016-04-09 DIAGNOSIS — L409 Psoriasis, unspecified: Secondary | ICD-10-CM | POA: Diagnosis not present

## 2016-04-09 DIAGNOSIS — F329 Major depressive disorder, single episode, unspecified: Secondary | ICD-10-CM | POA: Insufficient documentation

## 2016-04-09 DIAGNOSIS — O99613 Diseases of the digestive system complicating pregnancy, third trimester: Secondary | ICD-10-CM | POA: Insufficient documentation

## 2016-04-09 DIAGNOSIS — Z8249 Family history of ischemic heart disease and other diseases of the circulatory system: Secondary | ICD-10-CM | POA: Insufficient documentation

## 2016-04-09 DIAGNOSIS — Z880 Allergy status to penicillin: Secondary | ICD-10-CM | POA: Diagnosis not present

## 2016-04-09 DIAGNOSIS — O26893 Other specified pregnancy related conditions, third trimester: Secondary | ICD-10-CM | POA: Diagnosis not present

## 2016-04-09 DIAGNOSIS — Z3A34 34 weeks gestation of pregnancy: Secondary | ICD-10-CM | POA: Diagnosis not present

## 2016-04-09 DIAGNOSIS — O99213 Obesity complicating pregnancy, third trimester: Secondary | ICD-10-CM | POA: Diagnosis not present

## 2016-04-09 DIAGNOSIS — N898 Other specified noninflammatory disorders of vagina: Secondary | ICD-10-CM | POA: Insufficient documentation

## 2016-04-09 DIAGNOSIS — O99343 Other mental disorders complicating pregnancy, third trimester: Secondary | ICD-10-CM | POA: Insufficient documentation

## 2016-04-09 DIAGNOSIS — R197 Diarrhea, unspecified: Secondary | ICD-10-CM | POA: Insufficient documentation

## 2016-04-09 DIAGNOSIS — R001 Bradycardia, unspecified: Secondary | ICD-10-CM | POA: Diagnosis not present

## 2016-04-09 LAB — URINALYSIS, COMPLETE (UACMP) WITH MICROSCOPIC
Bilirubin Urine: NEGATIVE
Glucose, UA: NEGATIVE mg/dL
Hgb urine dipstick: NEGATIVE
Ketones, ur: NEGATIVE mg/dL
Nitrite: NEGATIVE
Protein, ur: NEGATIVE mg/dL
SPECIFIC GRAVITY, URINE: 1.008 (ref 1.005–1.030)
pH: 7 (ref 5.0–8.0)

## 2016-04-09 LAB — KOH PREP: KOH PREP: NONE SEEN

## 2016-04-09 MED ORDER — BETAMETHASONE SOD PHOS & ACET 6 (3-3) MG/ML IJ SUSP
12.0000 mg | Freq: Once | INTRAMUSCULAR | Status: AC
  Administered 2016-04-09: 12 mg via INTRAMUSCULAR
  Filled 2016-04-09: qty 2

## 2016-04-09 MED ORDER — PROMETHAZINE HCL 25 MG/ML IJ SOLN
25.0000 mg | Freq: Once | INTRAMUSCULAR | Status: AC
  Administered 2016-04-09: 25 mg via INTRAMUSCULAR
  Filled 2016-04-09: qty 1

## 2016-04-09 MED ORDER — MORPHINE SULFATE (PF) 10 MG/ML IV SOLN
10.0000 mg | Freq: Once | INTRAVENOUS | Status: AC
  Administered 2016-04-09: 10 mg via INTRAMUSCULAR
  Filled 2016-04-09: qty 1

## 2016-04-09 MED ORDER — ONDANSETRON HCL 4 MG PO TABS: 4.0000 mg | ORAL_TABLET | Freq: Once | ORAL | Status: DC

## 2016-04-09 MED ORDER — ONDANSETRON 4 MG PO TBDP
ORAL_TABLET | ORAL | Status: AC
Start: 2016-04-09 — End: 2016-04-09
  Administered 2016-04-09: 4 mg
  Filled 2016-04-09: qty 1

## 2016-04-09 MED ORDER — TERBUTALINE SULFATE 1 MG/ML IJ SOLN
0.2500 mg | Freq: Once | INTRAMUSCULAR | Status: AC
  Administered 2016-04-09: 0.25 mg via SUBCUTANEOUS
  Filled 2016-04-09: qty 1

## 2016-04-09 MED ORDER — FAMOTIDINE 20 MG PO TABS
20.0000 mg | ORAL_TABLET | Freq: Once | ORAL | Status: AC
  Administered 2016-04-09: 20 mg via ORAL
  Filled 2016-04-09: qty 1

## 2016-04-09 MED ORDER — ACETAMINOPHEN 325 MG PO TABS
650.0000 mg | ORAL_TABLET | ORAL | Status: DC | PRN
  Administered 2016-04-09 – 2016-04-10 (×2): 650 mg via ORAL
  Filled 2016-04-09 (×2): qty 2

## 2016-04-09 MED ORDER — BETAMETHASONE SOD PHOS & ACET 6 (3-3) MG/ML IJ SUSP: 12.0000 mg | Freq: Once | INTRAMUSCULAR | Status: DC

## 2016-04-09 NOTE — OB Triage Note (Signed)
Patient came in complaining of constant back pain since 2200 on 04/08/2016, complaining of sharp, shooting pain coming from vagina and radiating to legs. Patient also complaining of intermittent lower abdominal pain rating 8/10. Patient also noted clear discharge today with white and yellow discharge x2 days. Monitors applied

## 2016-04-09 NOTE — Progress Notes (Signed)
Pt responded to terbutaline earlier with decrease in frequency of contractions to every 5-7 min. She had one episode of vomiting earlier due to heartburn. Her contractions have increased in frequency again in the past hour to every 2-4 min. She continues to rate them a 6/10 on the pain scale. Her cervix is unchanged at 1/50/posterior from the earlier check. I have offered her a choice of going home and taking some tylenol PM to see if she can get some sleep vs IM morphine and overnight stay at the hospital. Given the frequency and pain of her contractions and the potential for snow overnight, she is choosing to stay and have the morphine.   Isabel Higgins,Lindzey Zent, CNM

## 2016-04-10 DIAGNOSIS — O26893 Other specified pregnancy related conditions, third trimester: Secondary | ICD-10-CM | POA: Diagnosis not present

## 2016-04-10 MED ORDER — PANTOPRAZOLE SODIUM 40 MG PO TBEC: 40.0000 mg | DELAYED_RELEASE_TABLET | Freq: Two times a day (BID) | ORAL | 1 refills | Status: DC

## 2016-04-10 MED ORDER — FAMOTIDINE 20 MG PO TABS
20.0000 mg | ORAL_TABLET | Freq: Once | ORAL | Status: AC
  Administered 2016-04-10: 20 mg via ORAL
  Filled 2016-04-10: qty 1

## 2016-04-10 MED ORDER — PANTOPRAZOLE SODIUM 40 MG PO TBEC
40.0000 mg | DELAYED_RELEASE_TABLET | Freq: Two times a day (BID) | ORAL | Status: DC
  Administered 2016-04-10: 40 mg via ORAL
  Filled 2016-04-10 (×2): qty 1

## 2016-04-10 MED ORDER — CALCIUM CARBONATE ANTACID 500 MG PO CHEW: 1.0000 | CHEWABLE_TABLET | ORAL | Status: DC | PRN

## 2016-04-10 MED ORDER — CALCIUM CARBONATE ANTACID 500 MG PO CHEW
CHEWABLE_TABLET | ORAL | Status: AC
  Administered 2016-04-10: 03:00:00
  Filled 2016-04-10: qty 2

## 2016-04-10 NOTE — H&P (Signed)
OB History & Physical   History of Present Illness:  Chief Complaint: abdominal pain, nausea  HPI:  Isabel Higgins is a 21 y.o. G1P0000 female at 568w1d dated by LMP consistent with a 6 week ultrasound.  Her pregnancy has been complicated by obesity (BMI 30), multiple visits to L&D, early pregnancy feeling of lightheaded with Holter monitor negative.    She reports contractions.   She denies leakage of fluid.   She denies vaginal bleeding.   She reports fetal movement.   She went to work yesterday and began having lower abdominal and low back pain.  The pain was sharp and intense.  It would come and go. Nothing was making it better or worse.  She also has had some nausea with emesis.    Maternal Medical History:   Past Medical History:  Diagnosis Date  . Bradycardia   . Depression   . Mental disorder   . Psoriasis     Past Surgical History:  Procedure Laterality Date  . INNER EAR SURGERY     right  . SHOULDER SURGERY     right    Allergies  Allergen Reactions  . Amoxicillin Nausea And Vomiting    Prior to Admission medications   Medication Sig Start Date End Date Taking? Authorizing Provider  calcium carbonate (TUMS - DOSED IN MG ELEMENTAL CALCIUM) 500 MG chewable tablet Chew 1 tablet by mouth daily.   Yes Historical Provider, MD  Prenatal Vit-Fe Fumarate-FA (PRENATAL MULTIVITAMIN) TABS tablet Take 1 tablet by mouth daily at 12 noon.   Yes Historical Provider, MD  famotidine (PEPCID) 20 MG tablet Take 20 mg by mouth 2 (two) times daily.    Historical Provider, MD    OB History  Gravida Para Term Preterm AB Living  1 0 0 0 0 0  SAB TAB Ectopic Multiple Live Births               # Outcome Date GA Lbr Len/2nd Weight Sex Delivery Anes PTL Lv  1 Current               Prenatal care site: [facility]  Social History: She  reports that she has never smoked. She has never used smokeless tobacco. She reports that she does not drink alcohol or use drugs.  Family  History: family history includes Diabetes in her maternal grandfather and paternal grandfather; Heart attack in her maternal grandmother; Hypertension in her father.   Review of Systems: Negative x 10 systems reviewed except as noted in the HPI.    Physical Exam:  Vital Signs: BP (!) 116/53   Pulse (!) 105   Temp 98.1 F (36.7 C) (Oral)   Resp 18   Ht 5\' 7"  (1.702 m)   Wt 220 lb (99.8 kg)   LMP 08/15/2015   SpO2 96%   BMI 34.46 kg/m  General: no acute distress.  HEENT: normocephalic, atraumatic Heart: regular rate & rhythm.  No murmurs/rubs/gallops Lungs: clear to auscultation bilaterally Abdomen: soft, gravid, non-tender Pelvic: (female chaperone present during pelvic exam)  External: Normal external female genitalia  Cervix: Dilation: 1 / Effacement (%): 50 /     Wet [place]: negative yeast Extremities: non-tender, symmetric, no edema bilaterally.  DTRs: 2+  Neurologic: Alert & oriented x 3.    Pertinent Results:  Prenatal Labs: Blood type/Rh A positive  Antibody screen negative  Rubella Immune  Varicella Immune    RPR NR  HBsAg negative  HIV negative  GC negative  Chlamydia negative  Genetic screening 1st trim scrn negative  1 hour GTT 122 at 28 wks  3 hour GTT n/a  GBS N/a given GA   Baseline FHR: 120 beats/min   Variability: moderate   Accelerations: present   Decelerations: absent Contractions: present at early admission coming q2-4 minutes Overall assessment: category 1   Assessment:  Isabel Higgins is a 21 y.o. G1P0000 female at [redacted]w[redacted]d with lower abdominal pain, early labor, nausea/vomiting.   Plan:  1. Admit to Labor & Delivery for observation 2. Patient has been given BMTZ, terbutaline (with improvement of abdominal and back pain). However, when terbutaline wore off her pain returned.  She has received pepcid x 2 doses with nausea improvement for a couple hours.  She has had zofran.  She was given morphine and phenergan overnight.  She had a couple  hours worth of pain relief, but her contractions returned. As of 7am her contractions had improved.  Of note, when she had her cervix check there was a fair amount of bloody show.   3. GBS unknown.   4. Fetwal well-being: reassuring overall 5. Continue to monitor.  Will give protonix for reflux, as zantac only helps briefly. 6. Discharge once stable.    Conard Novak, MD 04/10/2016 7:08 AM

## 2016-04-10 NOTE — Progress Notes (Signed)
Mild upper epigastric pain from reflux/acid; no lower abd pain or ctxs.  No VB or ROM.    Protonix helps.  Rested well last night.  No diarrhea.  Nausea only when eats certain foods. Exam- BP stable, no fever    Chest clear    Heart reg    Abd NT, ND, gravid    Extr no edema, normal pulses    SVE FT to 1, 30-50%, -3  A NST procedure was performed with FHR monitoring and a normal baseline established, appropriate time of 20-40 minutes of evaluation, and accels >2 seen w 15x15 characteristics.  Results show a REACTIVE NST.   A/P: GERD; no s/sx PTL Pt has received one dose of BMZ.  Counseled on pros and cons of second dose, of BMZ at 34-37 weeks, and of not getting second dose. I feel low liklihood for PTD at this point. Cont to treat GERD w Protonix and anti-emetics

## 2016-04-10 NOTE — H&P (Signed)
OB History & Physical   History of Present Illness:  Chief Complaint: back pain, abdominal pain since the evening of 1/15, reflux with vomiting  HPI:  Isabel Higgins is a 21 y.o. G1P0000 female at 5860w1d dated by LMP.  Her pregnancy has been complicated by obesity bmi 30.    She reports contractions.   She denies leakage of fluid.   She denies vaginal bleeding.   She reports fetal movement. She has had some clear/yellow vaginal discharge.    Maternal Medical History:   Past Medical History:  Diagnosis Date  . Bradycardia   . Depression   . Mental disorder   . Psoriasis     Past Surgical History:  Procedure Laterality Date  . INNER EAR SURGERY     right  . SHOULDER SURGERY     right    Allergies  Allergen Reactions  . Amoxicillin Nausea And Vomiting    Prior to Admission medications   Medication Sig Start Date End Date Taking? Authorizing Provider  calcium carbonate (TUMS - DOSED IN MG ELEMENTAL CALCIUM) 500 MG chewable tablet Chew 1 tablet by mouth daily.   Yes Historical Provider, MD  Prenatal Vit-Fe Fumarate-FA (PRENATAL MULTIVITAMIN) TABS tablet Take 1 tablet by mouth daily at 12 noon.   Yes Historical Provider, MD  famotidine (PEPCID) 20 MG tablet Take 20 mg by mouth 2 (two) times daily.    Historical Provider, MD    OB History  Gravida Para Term Preterm AB Living  1 0 0 0 0 0  SAB TAB Ectopic Multiple Live Births               # Outcome Date GA Lbr Len/2nd Weight Sex Delivery Anes PTL Lv  1 Current               Prenatal care site: [facility]  Social History: She  reports that she has never smoked. She has never used smokeless tobacco. She reports that she does not drink alcohol or use drugs.  Family History: family history includes Diabetes in her maternal grandfather and paternal grandfather; Heart attack in her maternal grandmother; Hypertension in her father.   Review of Systems: Negative x 10 systems reviewed except as noted in the HPI.     Physical Exam:  Vital Signs: BP (!) 116/53   Pulse (!) 105   Temp 98.1 F (36.7 C) (Oral)   Resp 18   Ht 5\' 7"  (1.702 m)   Wt 220 lb (99.8 kg)   LMP 08/15/2015   SpO2 96%   BMI 34.46 kg/m  General: no acute distress.  HEENT: normocephalic, atraumatic Heart: regular rate & rhythm.  No murmurs/rubs/gallops Lungs: clear to auscultation bilaterally Abdomen: soft, gravid, non-tender;  EFW: 6 pounds Pelvic:   External: Normal external female genitalia  Cervix: Dilation: 1 / Effacement (%): 50 / posterior  Bleeding on exam most likely from friable cervix    Extremities: non-tender, symmetric, trace edema bilaterally.  DTRs: 2+ Neurologic: Alert & oriented x 3.  Labs:    Results for Isabel LullGEORGE, Dorotea L (MRN [id number]) as of 04/10/2016 07:09  Ref. Range 04/09/2016 16:30 04/09/2016 16:36  KOH Prep Unknown  NO YEAST OR FUNGA...  KOH PREP Unknown  Rpt  Appearance Latest Ref Range: CLEAR  CLEAR (A)   Bacteria, UA Latest Ref Range: NONE SEEN  RARE (A)   Bilirubin Urine Latest Ref Range: NEGATIVE  NEGATIVE   Color, Urine Latest Ref Range: YELLOW  YELLOW (A)  Glucose Latest Ref Range: NEGATIVE mg/dL NEGATIVE   Hgb urine dipstick Latest Ref Range: NEGATIVE  NEGATIVE   Ketones, ur Latest Ref Range: NEGATIVE mg/dL NEGATIVE   Leukocytes, UA Latest Ref Range: NEGATIVE  TRACE (A)   Nitrite Latest Ref Range: NEGATIVE  NEGATIVE   pH Latest Ref Range: 5.0 - 8.0  7.0   Protein Latest Ref Range: NEGATIVE mg/dL NEGATIVE   RBC / HPF Latest Ref Range: 0 - 5 RBC/hpf 0-5   Specific Gravity, Urine Latest Ref Range: 1.005 - 1.030  1.008   Squamous Epithelial / LPF Latest Ref Range: NONE SEEN  0-5 (A)   WBC, UA Latest Ref Range: 0 - 5 WBC/hpf 0-5     Pertinent Results:  Prenatal Labs: Blood type/Rh A positive  Antibody screen negative  Rubella Immune  Varicella Immune    RPR negative  HBsAg negative  HIV negative  GC negative  Chlamydia negative  Genetic screening 1st trimester screen  negative  1 hour GTT 122  3 hour GTT NA  GBS Not done yet    Baseline FHR: 130 beats/min   Variability: moderate   Accelerations: present   Decelerations: absent Contractions: present frequency: q 2-4 Overall assessment: Category I   Assessment:  Isabel Higgins is a 21 y.o. G1P0000 female at [redacted]w[redacted]d with contractions not in labor.   Plan:  1. Admit to Labor & Delivery for observation 2. Terbutaline to stop contractions 3. Betamethasone now and again in 24 hours for fetal lung maturity   4. Fetal well-being: Category I 5. Wet prep, UA to rule out vaginitis/UTI   Tresea Mall, CNM 04/10/2016 6:55 AM

## 2016-04-10 NOTE — Discharge Summary (Signed)
Physician Discharge Summary  Patient ID: Isabel Higgins MRN: [id number] DOB/AGE: [birth year] 23 y.o.  Admit date: 04/09/2016 Discharge date: 04/10/2016  Admission Diagnoses:  Gastroesophageal Reflux Disease, Upper Abdominal Pain, Nausea  Discharge Diagnoses: same  Discharged Condition: good  Hospital Course: See H&P and notes; pateint admitted for nausea, vomiting (especially after eating), diarrhea, pressure feeling and irregular contractions.  She was evaluated for labor with no S/SX preterm labor.  She did receive one course of betamethasone.  Other treatments included one dose terbutaline, morphine, phenergan, and Protonix.  On day of discharge, her only remaining sx is acid reflux and heartburn; no nausea, no recent diarrhea, no fever, no ctxs, no VB or LOF; good FM.  No cervical change on exams.   Consults: None  Significant Diagnostic Studies: A NST procedure was performed with FHR monitoring and a normal baseline established, appropriate time of 20-40 minutes of evaluation, and accels >2 seen w 15x15 characteristics.  Results show a REACTIVE NST.   Treatments: steroids: BMZ x1 and terbutaline x1  Discharge Exam: Blood pressure (!) 116/53, pulse (!) 105, temperature 98.1 F (36.7 C), temperature source Oral, resp. rate 18, height 5\' 7"  (1.702 m), weight 99.8 kg (220 lb), last menstrual period 08/15/2015, SpO2 96 %. General appearance: alert, cooperative and no distress GI: soft, non-tender; bowel sounds normal; no masses,  no organomegaly Pelvic: external genitalia normal and Cx 1/50/-3 Extremities: extremities normal, atraumatic, no cyanosis or edema FHT 140s  Disposition: 01-Home or Self Care   Allergies as of 04/10/2016      Reactions   Amoxicillin Nausea And Vomiting      Medication List    TAKE these medications   calcium carbonate 500 MG chewable tablet Commonly known as:  TUMS - dosed in mg elemental calcium Chew 1 tablet by mouth daily.   famotidine 20 MG  tablet Commonly known as:  PEPCID Take 20 mg by mouth 2 (two) times daily.   pantoprazole 40 MG tablet Commonly known as:  PROTONIX Take 1 tablet (40 mg total) by mouth 2 (two) times daily before a meal.   prenatal multivitamin Tabs tablet Take 1 tablet by mouth daily at 12 noon.      Has f/u appt ion 2 days  Signed: Letitia LibraRobert Paul Hong Moring 04/10/2016, 9:47 AM

## 2016-04-22 ENCOUNTER — Observation Stay
Admission: EM | Admit: 2016-04-22 | Discharge: 2016-04-22 | Disposition: A | Discharge status: Home / Self Care | Payer: Medicaid Other | Attending: Obstetrics and Gynecology | Admitting: Obstetrics and Gynecology

## 2016-04-22 DIAGNOSIS — Z3403 Encounter for supervision of normal first pregnancy, third trimester: Secondary | ICD-10-CM | POA: Diagnosis not present

## 2016-04-22 DIAGNOSIS — O479 False labor, unspecified: Secondary | ICD-10-CM | POA: Diagnosis present

## 2016-04-22 DIAGNOSIS — Z881 Allergy status to other antibiotic agents status: Secondary | ICD-10-CM | POA: Diagnosis not present

## 2016-04-22 DIAGNOSIS — Z3493 Encounter for supervision of normal pregnancy, unspecified, third trimester: Secondary | ICD-10-CM | POA: Diagnosis present

## 2016-04-22 NOTE — Discharge Instructions (Signed)
Drink plenty of fluids, rest frequently.  °

## 2016-04-22 NOTE — OB Triage Note (Signed)
Discharge instructions reviewed with patient and significant other. Labor precautions and braxton hicks reviewed. Patient verbalizes understanding, denies questions at this time. Patient ambulatory off unit at this time in stable condition in company of husband.

## 2016-04-22 NOTE — OB Triage Note (Signed)
Patient complaining on painful contractions 7/10 every 3 minutes since 0000. Patient having mucous discharge, and denies vaginal bleeding. Patient states she has felt baby moving, and has thrown up x1 this evening.

## 2016-04-22 NOTE — Final Progress Note (Signed)
Physician Final Progress Note  Patient ID: Isabel Higgins MRN: [id number] DOB/AGE: 09/26/1995 21 y.o.  Admit date: 04/22/2016 Admitting provider: Vena Austria, MD Discharge date: 04/22/2016   Admission Diagnoses: Braxton-Hicks contractions  Discharge Diagnoses:  Active Problems:   Irregular contractions  21 yo G1P0000 at [redacted]w[redacted]d presenting with irregular uterine contractions starting at midnight.  +FM, no LOF, no VB   Consults: None  Significant Findings/ Diagnostic Studies: none  Procedures: NST 140, moderate, +accels, no decels, toco rare contractions q2min  Discharge Condition: good  Disposition: 01-Home or Self Care  Diet: Regular diet  Discharge Activity: Activity as tolerated  Discharge Instructions    Discharge activity:  No Restrictions    Complete by:  As directed    Fetal Kick Count:  Lie on our left side for one hour after a meal, and count the number of times your baby kicks.  If it is less than 5 times, get up, move around and drink some juice.  Repeat the test 30 minutes later.  If it is still less than 5 kicks in an hour, notify your doctor.    Complete by:  As directed    LABOR:  When conractions begin, you should start to time them from the beginning of one contraction to the beginning  of the next.  When contractions are 5 - 10 minutes apart or less and have been regular for at least an hour, you should call your health care provider.    Complete by:  As directed    No sexual activity restrictions    Complete by:  As directed    Notify physician for bleeding from the vagina    Complete by:  As directed    Notify physician for blurring of vision or spots before the eyes    Complete by:  As directed    Notify physician for chills or fever    Complete by:  As directed    Notify physician for fainting spells, "black outs" or loss of consciousness    Complete by:  As directed    Notify physician for increase in vaginal discharge    Complete by:  As  directed    Notify physician for leaking of fluid    Complete by:  As directed    Notify physician for pain or burning when urinating    Complete by:  As directed    Notify physician for pelvic pressure (sudden increase)    Complete by:  As directed    Notify physician for severe or continued nausea or vomiting    Complete by:  As directed    Notify physician for sudden gushing of fluid from the vagina (with or without continued leaking)    Complete by:  As directed    Notify physician for sudden, constant, or occasional abdominal pain    Complete by:  As directed    Notify physician if baby moving less than usual    Complete by:  As directed      Allergies as of 04/22/2016      Reactions   Amoxicillin Nausea And Vomiting      Medication List    TAKE these medications   calcium carbonate 500 MG chewable tablet Commonly known as:  TUMS - dosed in mg elemental calcium Chew 1 tablet by mouth daily.   famotidine 20 MG tablet Commonly known as:  PEPCID Take 20 mg by mouth 2 (two) times daily.   pantoprazole 40 MG tablet Commonly known as:  PROTONIX Take 1 tablet (40 mg total) by mouth 2 (two) times daily before a meal.   prenatal multivitamin Tabs tablet Take 1 tablet by mouth daily at 12 noon.        Total time spent taking care of this patient: NST reviewed, triaged remotely Signed: Lorrene ReidSTAEBLER, Valerio Pinard M 04/22/2016, 2:46 AM

## 2016-04-22 NOTE — Discharge Summary (Signed)
See final progress note. 

## 2016-04-26 ENCOUNTER — Observation Stay
Admission: EM | Admit: 2016-04-26 | Discharge: 2016-04-26 | Disposition: A | Discharge status: Home / Self Care | Payer: Medicaid Other | Attending: Obstetrics and Gynecology | Admitting: Obstetrics and Gynecology

## 2016-04-26 ENCOUNTER — Encounter: Payer: Self-pay | Admitting: *Deleted

## 2016-04-26 DIAGNOSIS — Z3A36 36 weeks gestation of pregnancy: Secondary | ICD-10-CM | POA: Insufficient documentation

## 2016-04-26 DIAGNOSIS — Z881 Allergy status to other antibiotic agents status: Secondary | ICD-10-CM | POA: Insufficient documentation

## 2016-04-26 DIAGNOSIS — O479 False labor, unspecified: Secondary | ICD-10-CM | POA: Diagnosis present

## 2016-04-26 NOTE — Discharge Summary (Signed)
See final progress note. 

## 2016-04-26 NOTE — OB Triage Note (Signed)
Pt. Here for right lower abd pain radiating to right lower back. Pain 7/10, "period cramp pains". Sexual encounter was a "couple of months". Pt. Denies sudden gush of fluid, no vaginal bleeding. Positive for fetal movement. Urine clean catch collected. Seen in office yesterday, ([facility]).

## 2016-04-26 NOTE — Final Progress Note (Signed)
Physician Final Progress Note  Patient ID: Isabel LullKatelyn L Higgins MRN: [id number] DOB/AGE: 1995-06-21 21 y.o.  Admit date: 04/26/2016 Admitting provider: Vena AustriaAndreas Evola Hollis, MD Discharge date: 04/26/2016   Admission Diagnoses: irregular contractions  Discharge Diagnoses:  Active Problems:   Irregular contractions  21 year old G1P0000 at 3056w3d presenting for rule out labor.  Cervix unchanged from clinic exam yesterday, and unchanged over 1-hr.  +FM, no LOF, no VB  Consults: None  Significant Findings/ Diagnostic Studies: negative  Procedures: NST 130, moderate variability, +accels, no decels  Discharge Condition: good  Disposition: 01-Home or Self Care  Diet: Regular diet  Discharge Activity: Activity as tolerated  Discharge Instructions    Discharge activity:  No Restrictions    Complete by:  As directed    Fetal Kick Count:  Lie on our left side for one hour after a meal, and count the number of times your baby kicks.  If it is less than 5 times, get up, move around and drink some juice.  Repeat the test 30 minutes later.  If it is still less than 5 kicks in an hour, notify your doctor.    Complete by:  As directed    LABOR:  When conractions begin, you should start to time them from the beginning of one contraction to the beginning  of the next.  When contractions are 5 - 10 minutes apart or less and have been regular for at least an hour, you should call your health care provider.    Complete by:  As directed    No sexual activity restrictions    Complete by:  As directed    Notify physician for bleeding from the vagina    Complete by:  As directed    Notify physician for blurring of vision or spots before the eyes    Complete by:  As directed    Notify physician for chills or fever    Complete by:  As directed    Notify physician for fainting spells, "black outs" or loss of consciousness    Complete by:  As directed    Notify physician for increase in vaginal discharge     Complete by:  As directed    Notify physician for leaking of fluid    Complete by:  As directed    Notify physician for pain or burning when urinating    Complete by:  As directed    Notify physician for pelvic pressure (sudden increase)    Complete by:  As directed    Notify physician for severe or continued nausea or vomiting    Complete by:  As directed    Notify physician for sudden gushing of fluid from the vagina (with or without continued leaking)    Complete by:  As directed    Notify physician for sudden, constant, or occasional abdominal pain    Complete by:  As directed    Notify physician if baby moving less than usual    Complete by:  As directed      Allergies as of 04/26/2016      Reactions   Amoxicillin Nausea And Vomiting      Medication List    TAKE these medications   calcium carbonate 500 MG chewable tablet Commonly known as:  TUMS - dosed in mg elemental calcium Chew 1 tablet by mouth daily.   famotidine 20 MG tablet Commonly known as:  PEPCID Take 20 mg by mouth 2 (two) times daily.   pantoprazole 40  MG tablet Commonly known as:  PROTONIX Take 1 tablet (40 mg total) by mouth 2 (two) times daily before a meal.   prenatal multivitamin Tabs tablet Take 1 tablet by mouth daily at 12 noon.        Total time spent taking care of this patient: 30 minutes  Signed: Lorrene Reid 04/26/2016, 3:56 PM

## 2016-04-26 NOTE — Progress Notes (Signed)
Pt. Ready for discharge to home with spouse and family members at the bedside.  Discharge instructions reviewed with patient along with labor precautions.  Pt. Verbalized understanding of all instructions, signed copies and a copy given.  Pt. Encouraged to follow up with scheduled OB appointments.

## 2016-05-01 ENCOUNTER — Encounter: Payer: Self-pay | Admitting: *Deleted

## 2016-05-01 ENCOUNTER — Observation Stay
Admission: EM | Admit: 2016-05-01 | Discharge: 2016-05-01 | Disposition: A | Discharge status: Home / Self Care | Payer: Medicaid Other | Attending: Obstetrics & Gynecology | Admitting: Obstetrics & Gynecology

## 2016-05-01 DIAGNOSIS — M545 Low back pain: Secondary | ICD-10-CM | POA: Diagnosis not present

## 2016-05-01 DIAGNOSIS — Z6830 Body mass index (BMI) 30.0-30.9, adult: Secondary | ICD-10-CM | POA: Diagnosis not present

## 2016-05-01 DIAGNOSIS — Z880 Allergy status to penicillin: Secondary | ICD-10-CM | POA: Insufficient documentation

## 2016-05-01 DIAGNOSIS — F329 Major depressive disorder, single episode, unspecified: Secondary | ICD-10-CM | POA: Diagnosis not present

## 2016-05-01 DIAGNOSIS — E669 Obesity, unspecified: Secondary | ICD-10-CM | POA: Diagnosis not present

## 2016-05-01 DIAGNOSIS — Z833 Family history of diabetes mellitus: Secondary | ICD-10-CM | POA: Diagnosis not present

## 2016-05-01 DIAGNOSIS — Z79899 Other long term (current) drug therapy: Secondary | ICD-10-CM | POA: Diagnosis not present

## 2016-05-01 DIAGNOSIS — Z8249 Family history of ischemic heart disease and other diseases of the circulatory system: Secondary | ICD-10-CM | POA: Insufficient documentation

## 2016-05-01 DIAGNOSIS — O26899 Other specified pregnancy related conditions, unspecified trimester: Secondary | ICD-10-CM | POA: Diagnosis present

## 2016-05-01 DIAGNOSIS — Z3403 Encounter for supervision of normal first pregnancy, third trimester: Secondary | ICD-10-CM | POA: Diagnosis not present

## 2016-05-01 DIAGNOSIS — R109 Unspecified abdominal pain: Secondary | ICD-10-CM | POA: Diagnosis not present

## 2016-05-01 MED ORDER — ACETAMINOPHEN 325 MG PO TABS: 650.0000 mg | ORAL_TABLET | ORAL | Status: DC | PRN

## 2016-05-01 NOTE — Discharge Summary (Signed)
Physician Discharge Summary  Patient ID: Isabel Higgins MRN: [id number] DOB/AGE: November 10, 1995 21 y.o.  Admit date: 05/01/2016 Discharge date: 05/01/2016  Admission Diagnoses:  Discharge Diagnoses:  Active Problems:   Abdominal pain affecting pregnancy   Discharged Condition: good  Hospital Course: Seen and monitored for labor due to CC of Abd Pain and ctxs-like pain in lower abd, some radiaiton to back.  Over course of her stay here she has had no cervical change and irreg ctxs or uterine irritability on toco.  Options of inpaitnet and outpatient mgt discussed.  Significant Diagnostic Studies: A NST procedure was performed with FHR monitoring and a normal baseline established, appropriate time of 20-40 minutes of evaluation, and accels >2 seen w 15x15 characteristics.  Results show a REACTIVE NST.   Discharge Exam: Blood pressure 122/85, pulse (!) 104, temperature 98.3 F (36.8 C), temperature source Oral, resp. rate 16, height 5\' 7"  (1.702 m), weight 228 lb (103.4 kg), last menstrual period 08/15/2015. Cervix unchanged, 3/60/high station  Disposition: 01-Home or Self Care   Allergies as of 05/01/2016      Reactions   Amoxicillin Nausea And Vomiting   Honeysuckle Flower [lonicera] Swelling, Rash      Medication List    TAKE these medications   calcium carbonate 500 MG chewable tablet Commonly known as:  TUMS - dosed in mg elemental calcium Chew 1 tablet by mouth daily.   famotidine 20 MG tablet Commonly known as:  PEPCID Take 20 mg by mouth 2 (two) times daily.   pantoprazole 40 MG tablet Commonly known as:  PROTONIX Take 1 tablet (40 mg total) by mouth 2 (two) times daily before a meal.   prenatal multivitamin Tabs tablet Take 1 tablet by mouth daily at 12 noon.      Follow-up Information    Letitia Libraobert Paul Leontae Bostock, MD. Go to.   Specialty:  Obstetrics and Gynecology Why:  Follow-up at your appointment tomorrow. Contact information: [address] [place]  [id number] [phone]           Signed: Letitia LibraRobert Paul Zyla Dascenzo 05/01/2016, 6:08 PM

## 2016-05-01 NOTE — OB Triage Note (Signed)
Ctx since 1200 today. Denies LOF. Isabel Higgins, Isabel Higgins

## 2016-05-01 NOTE — H&P (Signed)
OB History & Physical   History of Present Illness:  Chief Complaint: abdominal pain of pregnancy HPI:  Sharyn LullKatelyn L Higgins is a 21 y.o. G1P0000 female at 37w dated by LMP consistent with a 6 week ultrasound.  Her pregnancy has been complicated by obesity (BMI 30), multiple visits to L&D, early pregnancy feeling of lightheaded with Holter monitor negative.   She reports contractions.   She denies leakage of fluid.   She denies vaginal bleeding.   She reports fetal movement.   She noted today having lower abdominal and low back pain.  The pain was sharp and intense.  It would come and go. Nothing was making it better or worse.  She also has had some nausea with emesis. No other context.   Maternal Medical History:   Past Medical History:  Diagnosis Date  . Bradycardia   . Depression   . Mental disorder   . Psoriasis     Past Surgical History:  Procedure Laterality Date  . INNER EAR SURGERY     right  . SHOULDER SURGERY     right    Allergies  Allergen Reactions  . Amoxicillin Nausea And Vomiting  . Honeysuckle Flower [Lonicera] Swelling and Rash    Prior to Admission medications   Medication Sig Start Date End Date Taking? Authorizing Provider  calcium carbonate (TUMS - DOSED IN MG ELEMENTAL CALCIUM) 500 MG chewable tablet Chew 1 tablet by mouth daily.   Yes Historical Provider, MD  Prenatal Vit-Fe Fumarate-FA (PRENATAL MULTIVITAMIN) TABS tablet Take 1 tablet by mouth daily at 12 noon.   Yes Historical Provider, MD  famotidine (PEPCID) 20 MG tablet Take 20 mg by mouth 2 (two) times daily.    Historical Provider, MD    OB History  Gravida Para Term Preterm AB Living  1 0 0 0 0 0  SAB TAB Ectopic Multiple Live Births               # Outcome Date GA Lbr Len/2nd Weight Sex Delivery Anes PTL Lv  1 Current               Prenatal care site: [facility]  Social History: She  reports that she has never smoked. She has never used smokeless tobacco. She reports that she does  not drink alcohol or use drugs.  Family History: family history includes Diabetes in her maternal grandfather and paternal grandfather; Heart attack in her maternal grandmother; Hypertension in her father.   Review of Systems: Negative  except as noted in the HPI.    Physical Exam:  Vital Signs: BP 122/85 (BP Location: Left Arm)   Pulse (!) 104   Temp 98.3 F (36.8 C) (Oral)   Resp 16   LMP 08/15/2015  General: no acute distress.  HEENT: normocephalic, atraumatic Heart: regular rate & rhythm.  No murmurs/rubs/gallops Lungs: clear to auscultation bilaterally Abdomen: soft, gravid, non-tender Pelvic: (female chaperone present during pelvic exam)  External: Normal external female genitalia  Cervix: Dilation: 3 / Effacement (%): 60 / Station: [employer]   Wet [place]: negative yeast Extremities: non-tender, symmetric, no edema bilaterally.  DTRs: 2+  Neurologic: Alert & oriented x 3.    Pertinent Results:  Prenatal Labs: Blood type/Rh A positive  Antibody screen negative  Rubella Immune  Varicella Immune    RPR NR  HBsAg negative  HIV negative  GC negative  Chlamydia negative  Genetic screening 1st trim scrn negative  1 hour GTT 122 at 28 wks  3 hour GTT n/a  GBS N/a given GA   Baseline FHR: 140 beats/min   Variability: moderate   Accelerations: present   Decelerations: absent Contractions: irreg irritability on toco Overall assessment: category 1   Assessment:  Isabel Higgins is a 21 y.o. G60P0000 female at 37w with lower abdominal pain.   Plan:  1. Admit to Labor & Delivery for observation 2. Fetwal well-being: reassuring overall 3. Continue to monitor.   4. Discharge once stable.    Letitia Libra, MD 05/01/2016 4:36 PM

## 2016-05-03 ENCOUNTER — Inpatient Hospital Stay
Admission: EM | Admit: 2016-05-03 | Discharge: 2016-05-06 | DRG: 775 | Disposition: A | Discharge status: Home / Self Care | Payer: Medicaid Other | Attending: Obstetrics and Gynecology | Admitting: Obstetrics and Gynecology

## 2016-05-03 DIAGNOSIS — Z8249 Family history of ischemic heart disease and other diseases of the circulatory system: Secondary | ICD-10-CM

## 2016-05-03 DIAGNOSIS — Z833 Family history of diabetes mellitus: Secondary | ICD-10-CM

## 2016-05-03 DIAGNOSIS — O134 Gestational [pregnancy-induced] hypertension without significant proteinuria, complicating childbirth: Principal | ICD-10-CM | POA: Diagnosis present

## 2016-05-03 DIAGNOSIS — O99214 Obesity complicating childbirth: Secondary | ICD-10-CM | POA: Diagnosis present

## 2016-05-03 DIAGNOSIS — Z3A37 37 weeks gestation of pregnancy: Secondary | ICD-10-CM

## 2016-05-03 DIAGNOSIS — E669 Obesity, unspecified: Secondary | ICD-10-CM | POA: Diagnosis present

## 2016-05-03 DIAGNOSIS — Z6830 Body mass index (BMI) 30.0-30.9, adult: Secondary | ICD-10-CM

## 2016-05-03 DIAGNOSIS — O139 Gestational [pregnancy-induced] hypertension without significant proteinuria, unspecified trimester: Secondary | ICD-10-CM | POA: Diagnosis present

## 2016-05-03 DIAGNOSIS — O99824 Streptococcus B carrier state complicating childbirth: Secondary | ICD-10-CM | POA: Diagnosis present

## 2016-05-03 LAB — POCT NITRAZINE TEST: POCT Nitrazine (amniosure): NEGATIVE

## 2016-05-03 NOTE — OB Triage Note (Signed)
Patient presented to L&D complaining of leaking fluid since this morning.  Denies vaginal bleeding, or decreased fetal movement.

## 2016-05-04 ENCOUNTER — Encounter: Payer: Self-pay | Admitting: Obstetrics and Gynecology

## 2016-05-04 ENCOUNTER — Inpatient Hospital Stay: Payer: Medicaid Other | Admitting: Anesthesiology

## 2016-05-04 DIAGNOSIS — O99214 Obesity complicating childbirth: Secondary | ICD-10-CM | POA: Diagnosis present

## 2016-05-04 DIAGNOSIS — Z6830 Body mass index (BMI) 30.0-30.9, adult: Secondary | ICD-10-CM | POA: Diagnosis not present

## 2016-05-04 DIAGNOSIS — Z833 Family history of diabetes mellitus: Secondary | ICD-10-CM | POA: Diagnosis not present

## 2016-05-04 DIAGNOSIS — Z3493 Encounter for supervision of normal pregnancy, unspecified, third trimester: Secondary | ICD-10-CM | POA: Diagnosis present

## 2016-05-04 DIAGNOSIS — O134 Gestational [pregnancy-induced] hypertension without significant proteinuria, complicating childbirth: Secondary | ICD-10-CM | POA: Diagnosis present

## 2016-05-04 DIAGNOSIS — E669 Obesity, unspecified: Secondary | ICD-10-CM | POA: Diagnosis present

## 2016-05-04 DIAGNOSIS — O99824 Streptococcus B carrier state complicating childbirth: Secondary | ICD-10-CM | POA: Diagnosis present

## 2016-05-04 DIAGNOSIS — O139 Gestational [pregnancy-induced] hypertension without significant proteinuria, unspecified trimester: Secondary | ICD-10-CM | POA: Diagnosis present

## 2016-05-04 DIAGNOSIS — Z3A37 37 weeks gestation of pregnancy: Secondary | ICD-10-CM | POA: Diagnosis not present

## 2016-05-04 DIAGNOSIS — Z8249 Family history of ischemic heart disease and other diseases of the circulatory system: Secondary | ICD-10-CM | POA: Diagnosis not present

## 2016-05-04 LAB — CBC
HEMATOCRIT: 38.3 % (ref 35.0–47.0)
HEMOGLOBIN: 13.3 g/dL (ref 12.0–16.0)
MCH: 30.3 pg (ref 26.0–34.0)
MCHC: 34.8 g/dL (ref 32.0–36.0)
MCV: 87.3 fL (ref 80.0–100.0)
Platelets: 169 10*3/uL (ref 150–440)
RBC: 4.39 MIL/uL (ref 3.80–5.20)
RDW: 14.4 % (ref 11.5–14.5)
WBC: 14.9 10*3/uL — ABNORMAL HIGH (ref 3.6–11.0)

## 2016-05-04 LAB — COMPREHENSIVE METABOLIC PANEL
ALK PHOS: 209 U/L — AB (ref 38–126)
ALT: 25 U/L (ref 14–54)
ANION GAP: 12 (ref 5–15)
AST: 38 U/L (ref 15–41)
Albumin: 3 g/dL — ABNORMAL LOW (ref 3.5–5.0)
BILIRUBIN TOTAL: 0.9 mg/dL (ref 0.3–1.2)
BUN: 5 mg/dL — ABNORMAL LOW (ref 6–20)
CALCIUM: 9.5 mg/dL (ref 8.9–10.3)
CO2: 20 mmol/L — ABNORMAL LOW (ref 22–32)
Chloride: 102 mmol/L (ref 101–111)
Creatinine, Ser: 0.9 mg/dL (ref 0.44–1.00)
GFR calc non Af Amer: >60 mL/min (ref >60)
GLUCOSE: 96 mg/dL (ref 65–99)
Potassium: 3 mmol/L — ABNORMAL LOW (ref 3.5–5.1)
Sodium: 134 mmol/L — ABNORMAL LOW (ref 135–145)
TOTAL PROTEIN: 6.7 g/dL (ref 6.5–8.1)

## 2016-05-04 LAB — TYPE AND SCREEN
ABO/RH(D): A POS
Antibody Screen: NEGATIVE

## 2016-05-04 LAB — PROTEIN / CREATININE RATIO, URINE
Creatinine, Urine: 131 mg/dL
PROTEIN CREATININE RATIO: 0.17 mg/mg{creat} — AB (ref 0.00–0.15)
TOTAL PROTEIN, URINE: 22 mg/dL

## 2016-05-04 MED ORDER — CEFAZOLIN IN D5W 1 GM/50ML IV SOLN
1.0000 g | Freq: Three times a day (TID) | INTRAVENOUS | Status: DC
  Administered 2016-05-04 (×2): 1 g via INTRAVENOUS
  Filled 2016-05-04 (×2): qty 50

## 2016-05-04 MED ORDER — OXYTOCIN 10 UNIT/ML IJ SOLN: 10.0000 [IU] | Freq: Once | INTRAMUSCULAR | Status: DC

## 2016-05-04 MED ORDER — TERBUTALINE SULFATE 1 MG/ML IJ SOLN: 0.2500 mg | Freq: Once | INTRAMUSCULAR | Status: DC | PRN

## 2016-05-04 MED ORDER — DIPHENHYDRAMINE HCL 50 MG/ML IJ SOLN: 12.5000 mg | INTRAMUSCULAR | Status: DC | PRN

## 2016-05-04 MED ORDER — PHENYLEPHRINE 40 MCG/ML (10ML) SYRINGE FOR IV PUSH (FOR BLOOD PRESSURE SUPPORT): 80.0000 ug | PREFILLED_SYRINGE | INTRAVENOUS | Status: DC | PRN

## 2016-05-04 MED ORDER — EPHEDRINE 5 MG/ML INJ
10.0000 mg | INTRAVENOUS | Status: DC | PRN
Start: 2016-05-04 — End: 2016-05-05

## 2016-05-04 MED ORDER — LACTATED RINGERS IV SOLN
500.0000 mL | Freq: Once | INTRAVENOUS | Status: AC
  Administered 2016-05-04: 500 mL via INTRAVENOUS

## 2016-05-04 MED ORDER — FENTANYL 2.5 MCG/ML W/ROPIVACAINE 0.2% IN NS 100 ML EPIDURAL INFUSION (ARMC-ANES)
EPIDURAL | Status: AC
  Filled 2016-05-04: qty 100

## 2016-05-04 MED ORDER — LIDOCAINE HCL (PF) 1 % IJ SOLN
30.0000 mL | INTRAMUSCULAR | Status: AC | PRN
  Administered 2016-05-04: 3 mL via SUBCUTANEOUS

## 2016-05-04 MED ORDER — EPHEDRINE SULFATE 50 MG/ML IJ SOLN
INTRAMUSCULAR | Status: AC
  Administered 2016-05-04: 10 mg
  Filled 2016-05-04: qty 1

## 2016-05-04 MED ORDER — OXYTOCIN 40 UNITS IN LACTATED RINGERS INFUSION - SIMPLE MED: 2.5000 [IU]/h | INTRAVENOUS | Status: DC

## 2016-05-04 MED ORDER — OXYTOCIN 10 UNIT/ML IJ SOLN
INTRAMUSCULAR | Status: AC
  Filled 2016-05-04: qty 2

## 2016-05-04 MED ORDER — LIDOCAINE-EPINEPHRINE (PF) 1.5 %-1:200000 IJ SOLN
INTRAMUSCULAR | Status: DC | PRN
  Administered 2016-05-04: 4 mL via EPIDURAL

## 2016-05-04 MED ORDER — AMMONIA AROMATIC IN INHA
RESPIRATORY_TRACT | Status: AC
  Filled 2016-05-04: qty 10

## 2016-05-04 MED ORDER — FENTANYL 2.5 MCG/ML W/ROPIVACAINE 0.2% IN NS 100 ML EPIDURAL INFUSION (ARMC-ANES): 10.0000 mL/h | EPIDURAL | Status: DC

## 2016-05-04 MED ORDER — CEFAZOLIN SODIUM-DEXTROSE 2-4 GM/100ML-% IV SOLN
2.0000 g | Freq: Once | INTRAVENOUS | Status: AC
  Administered 2016-05-04: 2 g via INTRAVENOUS
  Filled 2016-05-04: qty 100

## 2016-05-04 MED ORDER — MISOPROSTOL 200 MCG PO TABS
ORAL_TABLET | ORAL | Status: AC
  Filled 2016-05-04: qty 4

## 2016-05-04 MED ORDER — OXYTOCIN 40 UNITS IN LACTATED RINGERS INFUSION - SIMPLE MED
1.0000 m[IU]/min | INTRAVENOUS | Status: DC
  Administered 2016-05-04: 1 m[IU]/min via INTRAVENOUS
  Filled 2016-05-04: qty 1000

## 2016-05-04 MED ORDER — LIDOCAINE HCL (PF) 1 % IJ SOLN
INTRAMUSCULAR | Status: AC
  Filled 2016-05-04: qty 30

## 2016-05-04 MED ORDER — BUPIVACAINE HCL (PF) 0.25 % IJ SOLN
INTRAMUSCULAR | Status: DC | PRN
  Administered 2016-05-04: 5 mL via EPIDURAL

## 2016-05-04 MED ORDER — OXYTOCIN BOLUS FROM INFUSION: 500.0000 mL | Freq: Once | INTRAVENOUS | Status: DC

## 2016-05-04 MED ORDER — SOD CITRATE-CITRIC ACID 500-334 MG/5ML PO SOLN
30.0000 mL | ORAL | Status: DC | PRN
  Administered 2016-05-04: 30 mL via ORAL
  Filled 2016-05-04: qty 30
  Filled 2016-05-04: qty 15

## 2016-05-04 MED ORDER — FAMOTIDINE 20 MG PO TABS
20.0000 mg | ORAL_TABLET | Freq: Two times a day (BID) | ORAL | Status: DC
  Administered 2016-05-04 (×2): 20 mg via ORAL
  Filled 2016-05-04 (×2): qty 1

## 2016-05-04 MED ORDER — FENTANYL 2.5 MCG/ML W/ROPIVACAINE 0.2% IN NS 100 ML EPIDURAL INFUSION (ARMC-ANES)
EPIDURAL | Status: DC | PRN
  Administered 2016-05-04: 10 mL/h via EPIDURAL

## 2016-05-04 MED ORDER — LACTATED RINGERS IV SOLN: 500.0000 mL | INTRAVENOUS | Status: DC | PRN

## 2016-05-04 MED ORDER — EPHEDRINE 5 MG/ML INJ: 10.0000 mg | INTRAVENOUS | Status: DC | PRN

## 2016-05-04 MED ORDER — ONDANSETRON HCL 4 MG/2ML IJ SOLN
4.0000 mg | Freq: Four times a day (QID) | INTRAMUSCULAR | Status: DC | PRN
  Administered 2016-05-04 (×3): 4 mg via INTRAVENOUS
  Filled 2016-05-04 (×3): qty 2

## 2016-05-04 MED ORDER — LACTATED RINGERS IV SOLN
INTRAVENOUS | Status: DC
  Administered 2016-05-04 (×2): via INTRAVENOUS
  Administered 2016-05-04: 500 mL via INTRAVENOUS
  Administered 2016-05-04: 10:00:00 via INTRAVENOUS
  Administered 2016-05-04: 500 mL via INTRAVENOUS

## 2016-05-04 NOTE — Progress Notes (Signed)
Dr Pernell DupreAdams notified of pt condition

## 2016-05-04 NOTE — H&P (Signed)
OB History & Physical   History of Present Illness:  Chief Complaint: leaking fluid  HPI:  Isabel Higgins is a 21 y.o. G1P0000 female at 3673w4d dated by LMP consistent with 6 week ultrasound.  Her pregnancy has been complicated by obesity (BMI 30).    She reports contractions.   She reports leakage of fluid.   She denies vaginal bleeding.   She reports fetal movement.   She presents with persistent leakage of mucus-like fluid, which has been ongoing for several days.  She denies HA, visual changes, and RUQ pain.  She does have nausea.   Maternal Medical History:   Past Medical History:  Diagnosis Date  . Bradycardia   . Depression   . Mental disorder   . Psoriasis     Past Surgical History:  Procedure Laterality Date  . INNER EAR SURGERY     right  . SHOULDER SURGERY     right    Allergies  Allergen Reactions  . Amoxicillin Nausea And Vomiting  . Honeysuckle Flower [Lonicera] Swelling and Rash    Prior to Admission medications   Medication Sig Start Date End Date Taking? Authorizing Provider  calcium carbonate (TUMS - DOSED IN MG ELEMENTAL CALCIUM) 500 MG chewable tablet Chew 1 tablet by mouth daily.   Yes Historical Provider, MD  famotidine (PEPCID) 20 MG tablet Take 20 mg by mouth 2 (two) times daily.   Yes Historical Provider, MD  pantoprazole (PROTONIX) 40 MG tablet Take 1 tablet (40 mg total) by mouth 2 (two) times daily before a meal. 04/10/16  Yes Nadara Mustardobert P Harris, MD  Prenatal Vit-Fe Fumarate-FA (PRENATAL MULTIVITAMIN) TABS tablet Take 1 tablet by mouth daily at 12 noon.   Yes Historical Provider, MD    OB History  Gravida Para Term Preterm AB Living  1 0 0 0 0 0  SAB TAB Ectopic Multiple Live Births               # Outcome Date GA Lbr Len/2nd Weight Sex Delivery Anes PTL Lv  1 Current               Prenatal care site: [facility]  Social History: She  reports that she has never smoked. She has never used smokeless tobacco. She reports that she does  not drink alcohol or use drugs.  Family History: family history includes Diabetes in her maternal grandfather and paternal grandfather; Heart attack in her maternal grandmother; Hypertension in her father.   Review of Systems: Negative x 10 systems reviewed except as noted in the HPI.    Physical Exam:  Vital Signs: BP 110/64   Pulse (!) 105   Temp 98 F (36.7 C) (Oral)   Resp 17   Ht 5\' 7"  (1.702 m)   Wt 228 lb (103.4 kg)   LMP 08/15/2015   BMI 35.71 kg/m   BPs 130/90s Constitutional: Well nourished, well developed female in no acute distress.  HEENT: normal Skin: Warm and dry.  Cardiovascular: Regular rate and rhythm.   Extremity: no edema Respiratory: Clear to auscultation bilateral. Normal respiratory effort Abdomen: gravid, NT, EFW 7.5 pounds Back: no CVAT Neuro: DTRs 2+, Cranial nerves grossly intact Psych: Alert and Oriented x3. No memory deficits. Normal mood and affect.  MS: normal gait, normal bilateral lower extremity ROM/strength/stability.  Pelvic exam:  Cervix 3cm per RN (unchanged from clinic) Negative pooling, negative nitrazine  Pertinent Results:  Prenatal Labs: Blood type/Rh A positive  Antibody screen negative  Rubella  Immune  Varicella Immune    RPR NR  HBsAg negative  HIV negative  GC negative  Chlamydia negative  Genetic screening 1st trim screen neg  1 hour GTT 122 at 28 wks  3 hour GTT n/a  GBS positive on 04/25/16   Baseline FHR: 130 beats/min   Variability: moderate   Accelerations: present   Decelerations: absent Contractions: present frequency: 3 q 10 min Overall assessment: category 1  Lab Results  Component Value Date   WBC 14.9 (H) 05/04/2016   HGB 13.3 05/04/2016   HCT 38.3 05/04/2016   PLT 169 05/04/2016   CREATININE 0.90 05/04/2016   PROTCRRATIO 0.17 (H) 05/04/2016    Assessment:  Isabel Higgins is a 21 y.o. G1P0000 female at [redacted]w[redacted]d with gestational hypertension (see flowsheet).  No severe indicators.  Her creatinine is  0.90 and her baseline is 0.48 at a prior visit.  IOL for GHTN.   Plan:  1. Admit to Labor & Delivery  2. CBC, T&S, Clrs, IVF 3. GBS positive: ancef 4. Fetwal well-being: reassuring 5. IOL: pitocin given excellent Bishop score   Conard Novak, MD 05/04/2016 6:05 AM

## 2016-05-04 NOTE — Anesthesia Procedure Notes (Signed)
Epidural Patient location during procedure: OB Start time: 05/04/2016 3:58 PM  Preanesthetic Checklist Completed: patient identified, site marked, surgical consent, pre-op evaluation, timeout performed, IV checked, risks and benefits discussed and monitors and equipment checked  Epidural Patient position: sitting Prep: Betadine Patient monitoring: heart rate, continuous pulse ox and blood pressure Approach: midline Location: L4-L5 Injection technique: LOR air  Needle:  Needle type: Tuohy  Needle gauge: 17 G Needle length: 9 cm and 9 Needle insertion depth: 5 cm Catheter type: closed end flexible Catheter size: 19 Gauge Catheter at skin depth: 12 cm Test dose: negative and 1.5% lidocaine with Epi 1:200 K  Assessment Sensory level: T10 Events: blood not aspirated, injection not painful, no injection resistance, negative IV test and no paresthesia  Additional Notes Pt's history reviewed and consent obtained as per OB consent Patient tolerated the insertion well without complications. Negative SATD, negative IVTD All VSS were obtained and monitored through OBIX and nursing protocols followed.Reason for block:procedure for pain

## 2016-05-04 NOTE — Anesthesia Preprocedure Evaluation (Signed)
Anesthesia Evaluation  Patient identified by MRN, date of birth, ID band Patient awake    Reviewed: Allergy & Precautions, H&P , NPO status , Patient's Chart, lab work & pertinent test results, reviewed documented beta blocker date and time   Airway Mallampati: II  TM Distance: >3 FB Neck ROM: full    Dental no notable dental hx. (+) Teeth Intact   Pulmonary neg pulmonary ROS, Current Smoker,    Pulmonary exam normal breath sounds clear to auscultation       Cardiovascular Exercise Tolerance: Good hypertension, negative cardio ROS   Rhythm:regular Rate:Normal     Neuro/Psych PSYCHIATRIC DISORDERS negative neurological ROS  negative psych ROS   GI/Hepatic negative GI ROS, Neg liver ROS,   Endo/Other  negative endocrine ROSdiabetes  Renal/GU      Musculoskeletal   Abdominal   Peds  Hematology negative hematology ROS (+)   Anesthesia Other Findings   Reproductive/Obstetrics (+) Pregnancy                             Anesthesia Physical Anesthesia Plan  ASA: II  Anesthesia Plan: Epidural   Post-op Pain Management:    Induction:   Airway Management Planned:   Additional Equipment:   Intra-op Plan:   Post-operative Plan:   Informed Consent: I have reviewed the patients History and Physical, chart, labs and discussed the procedure including the risks, benefits and alternatives for the proposed anesthesia with the patient or authorized representative who has indicated his/her understanding and acceptance.     Plan Discussed with:   Anesthesia Plan Comments:         Anesthesia Quick Evaluation

## 2016-05-04 NOTE — Progress Notes (Signed)
  Labor Progress Note   20 y.o. G1P0000 @ [redacted]w[redacted]d , admitted for  Pregnancy, Labor Management. Induction of labor for gestational hypertension  Subjective:  Semi fowlers in bed with heartburn and vomiting. Pt feels contractions every 5 minutes and beginning to feel stronger  Objective:  BP 111/60 (BP Location: Right Arm)   Pulse 87   Temp 98.2 F (36.8 C) (Oral)   Resp 18   Ht 5\' 7"  (1.702 m)   Wt 228 lb (103.4 kg)   LMP 08/15/2015   BMI 35.71 kg/m  Abd: mild Extr: no edema SVE: CERVIX: 4 cm dilated, 80 effaced, -2 station, intact bag of water  EFM: FHR: 125 bpm, variability: moderate,  accelerations:  Present,  decelerations:  Present variable noted with cervical exam Toco: Frequency: Every 3-5 minutes Labs: I have reviewed the patient's lab results.   Assessment & Plan:  G1P0000 @ 667w4d, admitted for  Pregnancy and Labor/Delivery Management  1. Pain management: none. 2. FWB: FHT category II with variables overall reassuring.  3. ID: GBS positive treat with ancef 4. Labor management: cervical sweep with exam, pitocin, expectant vaginal delivery All discussed with patient, see orders   Aloise Copus, CNM

## 2016-05-04 NOTE — Progress Notes (Signed)
Pt epidural placed at 1614 by Adams MDA. Pt initial BP were WNL. Pt BP was 75/48 at 1642 and pt called out with n/v with 1 occurrence of emesis and dizziness. BP: 86/53 at 1645. Bolus of 500 mL started. Notified Adams MDA at 1645 and instructed to give Ephedrine 10 mg. Ephedrine 10 mg given at 1655. BP: 101/60 at 1656, 101/64 at 1658, and 120/75 at 1705. Pt reports no dizziness or n/v.

## 2016-05-04 NOTE — Progress Notes (Signed)
  Labor Progress Note   21 y.o. G1P0000 @ 9822w4d , admitted for  Pregnancy, Labor Management. Induction of labor for gestational hypertension  Subjective:  Back in bed from bathroom. Bloody show noted. Feeling contractions as back pain.  Objective:  BP 125/79 (BP Location: Right Arm)   Pulse 95   Temp 98.4 F (36.9 C) (Oral)   Resp 16   Ht 5\' 7"  (1.702 m)   Wt 228 lb (103.4 kg)   LMP 08/15/2015   BMI 35.71 kg/m  Abd: mild Extr: no edema SVE: CERVIX: 5 cm dilated, 80 effaced, -1 station AROM: clear with bloody show  EFM: FHR: 125 bpm, variability: moderate,  accelerations:  Present,  decelerations:  Absent Toco: Frequency: Every 2-4 minutes Labs: I have reviewed the patient's lab results.   Assessment & Plan:  G1P0000 @ 4822w4d, admitted for  Pregnancy and Labor/Delivery Management  1. Pain management: birth ball, position changes, epidural as needed. 2. FWB: FHT category I .  3. ID: GBS positive treat with ancef 4. Labor management: AROM, pitocin, expectant vaginal delivery All discussed with patient, see orders   Eyan Hagood, CNM

## 2016-05-05 LAB — CBC
HEMATOCRIT: 28.8 % — AB (ref 35.0–47.0)
HEMOGLOBIN: 10.4 g/dL — AB (ref 12.0–16.0)
MCH: 31 pg (ref 26.0–34.0)
MCHC: 36.1 g/dL — AB (ref 32.0–36.0)
MCV: 86 fL (ref 80.0–100.0)
Platelets: 142 10*3/uL — ABNORMAL LOW (ref 150–440)
RBC: 3.35 MIL/uL — ABNORMAL LOW (ref 3.80–5.20)
RDW: 14.4 % (ref 11.5–14.5)
WBC: 15.6 10*3/uL — ABNORMAL HIGH (ref 3.6–11.0)

## 2016-05-05 MED ORDER — OXYCODONE HCL 5 MG PO TABS: 5.0000 mg | ORAL_TABLET | ORAL | Status: DC | PRN

## 2016-05-05 MED ORDER — WITCH HAZEL-GLYCERIN EX PADS: 1.0000 "application " | MEDICATED_PAD | CUTANEOUS | Status: DC | PRN

## 2016-05-05 MED ORDER — ONDANSETRON HCL 4 MG PO TABS: 4.0000 mg | ORAL_TABLET | ORAL | Status: DC | PRN

## 2016-05-05 MED ORDER — DIPHENHYDRAMINE HCL 25 MG PO CAPS: 25.0000 mg | ORAL_CAPSULE | Freq: Four times a day (QID) | ORAL | Status: DC | PRN

## 2016-05-05 MED ORDER — IBUPROFEN 600 MG PO TABS
600.0000 mg | ORAL_TABLET | Freq: Four times a day (QID) | ORAL | Status: DC
  Administered 2016-05-05: 600 mg via ORAL
  Filled 2016-05-05: qty 1

## 2016-05-05 MED ORDER — PRENATAL MULTIVITAMIN CH
1.0000 | ORAL_TABLET | Freq: Every day | ORAL | Status: DC
  Administered 2016-05-05 – 2016-05-06 (×2): 1 via ORAL
  Filled 2016-05-05 (×2): qty 1

## 2016-05-05 MED ORDER — SENNOSIDES-DOCUSATE SODIUM 8.6-50 MG PO TABS
2.0000 | ORAL_TABLET | ORAL | Status: DC
  Administered 2016-05-05: 2 via ORAL
  Filled 2016-05-05: qty 2

## 2016-05-05 MED ORDER — SIMETHICONE 80 MG PO CHEW: 80.0000 mg | CHEWABLE_TABLET | ORAL | Status: DC | PRN

## 2016-05-05 MED ORDER — OXYCODONE HCL 5 MG PO TABS: 10.0000 mg | ORAL_TABLET | ORAL | Status: DC | PRN

## 2016-05-05 MED ORDER — ONDANSETRON HCL 4 MG/2ML IJ SOLN: 4.0000 mg | INTRAMUSCULAR | Status: DC | PRN

## 2016-05-05 MED ORDER — COCONUT OIL OIL
1.0000 "application " | TOPICAL_OIL | Status: DC | PRN
  Administered 2016-05-05: 1 via TOPICAL
  Filled 2016-05-05: qty 120

## 2016-05-05 MED ORDER — ACETAMINOPHEN 325 MG PO TABS: 650.0000 mg | ORAL_TABLET | ORAL | Status: DC | PRN

## 2016-05-05 MED ORDER — IBUPROFEN 600 MG PO TABS
600.0000 mg | ORAL_TABLET | Freq: Four times a day (QID) | ORAL | Status: DC
  Administered 2016-05-05 (×2): 600 mg via ORAL
  Filled 2016-05-05 (×2): qty 1

## 2016-05-05 MED ORDER — IBUPROFEN 600 MG PO TABS
600.0000 mg | ORAL_TABLET | Freq: Four times a day (QID) | ORAL | Status: DC
  Administered 2016-05-05 – 2016-05-06 (×3): 600 mg via ORAL
  Filled 2016-05-05 (×3): qty 1

## 2016-05-05 MED ORDER — DIBUCAINE 1 % RE OINT: 1.0000 "application " | TOPICAL_OINTMENT | RECTAL | Status: DC | PRN

## 2016-05-05 MED ORDER — OXYTOCIN 40 UNITS IN LACTATED RINGERS INFUSION - SIMPLE MED
INTRAVENOUS | Status: AC
  Filled 2016-05-05: qty 1000

## 2016-05-05 MED ORDER — BENZOCAINE-MENTHOL 20-0.5 % EX AERO: 1.0000 "application " | INHALATION_SPRAY | CUTANEOUS | Status: DC | PRN

## 2016-05-05 NOTE — Discharge Summary (Signed)
OB Discharge Summary     Patient Name: Isabel Higgins DOB: 10-01-1995 MRN: [id number]  Date of admission: 05/03/2016 Delivering MD: Tresea Mall   Date of Delivery: 05/05/2016  Date of discharge: 05/06/16  Admitting diagnosis: contractions Intrauterine pregnancy: [redacted]w[redacted]d     Secondary diagnosis: obesity, gestational hypertension     Discharge diagnosis: Term Pregnancy Delivered                                                                                                Post partum procedures:none  Augmentation: AROM and Pitocin  Complications: None  Hospital course:  Induction of Labor With Vaginal Delivery   21 y.o. yo G1P0000 at [redacted]w[redacted]d was admitted to the hospital 05/03/2016 for induction of labor.  Indication for induction: Gestational hypertension.  Patient had an uncomplicated labor course as follows: Membrane Rupture Time/Date: 2:38 PM ,05/04/2016   Delivery of viable female at 1:11 AM 05/05/2016 Details of delivery can be found in separate delivery note.    Patient had a routine postpartum course.  Patient is discharged home with normal BP measurements and no s/sx postpartum preclampsia.Marland Kitchen  Physical exam  Vitals:   05/05/16 1610 05/05/16 2000 05/06/16 0000 05/06/16 0818  BP: 121/82 132/79 103/65 125/78  Pulse: 87 83 79 83  Resp: 18 18 18 18   Temp: 98.7 F (37.1 C) 98.6 F (37 C) 98.4 F (36.9 C) 98.8 F (37.1 C)  TempSrc: Axillary  Oral Oral  SpO2: 99%   99%  Weight:      Height:       General: alert, cooperative and no distress Lochia: appropriate Uterine Fundus: firm Incision: N/A DVT Evaluation: No evidence of DVT seen on physical exam.  Labs: Lab Results  Component Value Date   WBC 15.6 (H) 05/05/2016   HGB 10.4 (L) 05/05/2016   HCT 28.8 (L) 05/05/2016   MCV 86.0 05/05/2016   PLT 142 (L) 05/05/2016   CMP Latest Ref Rng & Units 05/04/2016  Glucose 65 - 99 mg/dL 96  BUN 6 - 20 mg/dL 5(L)  Creatinine 6.21 - 1.00 mg/dL 3.08  Sodium 657 - 846 mmol/L  134(L)  Potassium 3.5 - 5.1 mmol/L 3.0(L)  Chloride 101 - 111 mmol/L 102  CO2 22 - 32 mmol/L 20(L)  Calcium 8.9 - 10.3 mg/dL 9.5  Total Protein 6.5 - 8.1 g/dL 6.7  Total Bilirubin 0.3 - 1.2 mg/dL 0.9  Alkaline Phos 38 - 126 U/L 209(H)  AST 15 - 41 U/L 38  ALT 14 - 54 U/L 25    Discharge instruction: per After Visit Summary.  Medications:  Allergies as of 05/06/2016      Reactions   Amoxicillin Nausea And Vomiting   Honeysuckle Flower [lonicera] Swelling, Rash      Medication List    TAKE these medications   calcium carbonate 500 MG chewable tablet Commonly known as:  TUMS - dosed in mg elemental calcium Chew 1 tablet by mouth daily.   famotidine 20 MG tablet Commonly known as:  PEPCID Take 20 mg by mouth 2 (two) times daily.   pantoprazole 40 MG tablet Commonly  known as:  PROTONIX Take 1 tablet (40 mg total) by mouth 2 (two) times daily before a meal.   prenatal multivitamin Tabs tablet Take 1 tablet by mouth daily at 12 noon.       Diet: routine diet  Activity: Advance as tolerated. Pelvic rest for 6 weeks.   Outpatient follow up: Follow-up Information    GLEDHILL,JANE, CNM. Schedule an appointment as soon as possible for a visit in 6 week(s).   Specialty:  Obstetrics Why:  postpartum visit Contact information: [address] [place] [address] [phone]             Postpartum contraception: Progesterone only pills and IUD Mirena (to decide at pp visit) Rhogam Given postpartum: NA Rubella vaccine given postpartum: Rubella Immune Varicella vaccine given postpartum: Varicella Immune TDaP given antepartum or postpartum: Antepartum  Newborn Data: Live born Female Wyatt Birth Weight:  4460g APGAR: 8, 9  Baby Feeding: Breast  Disposition:home with mother  SIGNED:  Letitia Libraobert Paul Manford Sprong, MD 05/06/2016 8:43 AM

## 2016-05-06 NOTE — Progress Notes (Signed)
Patient discharge to home via wheelchair with spouse and baby in car seat.  

## 2016-05-06 NOTE — Progress Notes (Signed)
Admit Date: 05/03/2016 Today's Date: 05/06/2016  Post Partum Day 1  Subjective:  no complaints, up ad lib, voiding and tolerating PO  Objective: Temp:  [97.7 F (36.5 C)-98.8 F (37.1 C)] 98.8 F (37.1 C) (02/12 0818) Pulse Rate:  [79-87] 83 (02/12 0818) Resp:  [18] 18 (02/12 0818) BP: (103-132)/(65-82) 125/78 (02/12 0818) SpO2:  [99 %] 99 % (02/12 0818)  Physical Exam:  General: alert, cooperative and no distress Lochia: appropriate Uterine Fundus: firm Incision: none DVT Evaluation: No evidence of DVT seen on physical exam. Negative Homan's sign.   Recent Labs  05/04/16 0019 05/05/16 0551  HGB 13.3 10.4*  HCT 38.3 28.8*    Assessment/Plan: Discharge home, Breastfeeding, Contraception (plans pill or IUD) and Infant doing well   LOS: 2 days   Isabel Higgins [facility] 05/06/2016, 8:45 AM

## 2016-05-06 NOTE — Anesthesia Postprocedure Evaluation (Signed)
Anesthesia Post Note  Patient: Isabel Higgins  Procedure(s) Performed: * No procedures listed *  Patient location during evaluation: Mother Baby Anesthesia Type: Epidural Level of consciousness: awake, awake and alert and oriented Pain management: pain level controlled Vital Signs Assessment: post-procedure vital signs reviewed and stable Respiratory status: spontaneous breathing, nonlabored ventilation and respiratory function stable Cardiovascular status: blood pressure returned to baseline and stable Postop Assessment: no backache and no headache Anesthetic complications: no     Last Vitals:  Vitals:   05/05/16 2000 05/06/16 0000  BP: 132/79 103/65  Pulse: 83 79  Resp: 18 18  Temp: 37 C 36.9 C    Last Pain:  Vitals:   05/06/16 0000  TempSrc: Oral  PainSc:                  Ginger CarneStephanie Lakai Moree

## 2016-05-09 ENCOUNTER — Emergency Department
Admission: EM | Admit: 2016-05-09 | Discharge: 2016-05-10 | Disposition: A | Discharge status: Home / Self Care | Payer: Medicaid Other | Attending: Emergency Medicine | Admitting: Emergency Medicine

## 2016-05-09 ENCOUNTER — Encounter: Payer: Self-pay | Admitting: *Deleted

## 2016-05-09 DIAGNOSIS — O9989 Other specified diseases and conditions complicating pregnancy, childbirth and the puerperium: Secondary | ICD-10-CM | POA: Insufficient documentation

## 2016-05-09 DIAGNOSIS — R1030 Lower abdominal pain, unspecified: Secondary | ICD-10-CM | POA: Diagnosis not present

## 2016-05-09 DIAGNOSIS — R42 Dizziness and giddiness: Secondary | ICD-10-CM | POA: Diagnosis not present

## 2016-05-09 DIAGNOSIS — R51 Headache: Secondary | ICD-10-CM | POA: Diagnosis not present

## 2016-05-09 DIAGNOSIS — R509 Fever, unspecified: Secondary | ICD-10-CM | POA: Diagnosis not present

## 2016-05-09 MED ORDER — SODIUM CHLORIDE 0.9 % IV BOLUS (SEPSIS)
1000.0000 mL | Freq: Once | INTRAVENOUS | Status: AC
  Administered 2016-05-10: 1000 mL via INTRAVENOUS

## 2016-05-09 MED ORDER — ACETAMINOPHEN 500 MG PO TABS
1000.0000 mg | ORAL_TABLET | Freq: Once | ORAL | Status: AC
  Administered 2016-05-10: 1000 mg via ORAL
  Filled 2016-05-09: qty 2

## 2016-05-09 NOTE — ED Triage Notes (Signed)
Pt is 4 days postpartum.  Pt had vaginal delivery at [facility].  Pt was induced due to preeclampsia.  Today pt has abd pain, fever, headache, dizziness.  Pt pale.

## 2016-05-10 ENCOUNTER — Emergency Department: Payer: Medicaid Other

## 2016-05-10 LAB — CBC WITH DIFFERENTIAL/PLATELET
BASOS ABS: 0 10*3/uL (ref 0–0.1)
BASOS PCT: 0 %
EOS ABS: 0.1 10*3/uL (ref 0–0.7)
Eosinophils Relative: 1 %
HCT: 30.1 % — ABNORMAL LOW (ref 35.0–47.0)
HEMOGLOBIN: 10.7 g/dL — AB (ref 12.0–16.0)
LYMPHS ABS: 1.5 10*3/uL (ref 1.0–3.6)
Lymphocytes Relative: 14 %
MCH: 31.5 pg (ref 26.0–34.0)
MCHC: 35.6 g/dL (ref 32.0–36.0)
MCV: 88.3 fL (ref 80.0–100.0)
Monocytes Absolute: 0.5 10*3/uL (ref 0.2–0.9)
Monocytes Relative: 5 %
NEUTROS PCT: 80 %
Neutro Abs: 8.2 10*3/uL — ABNORMAL HIGH (ref 1.4–6.5)
Platelets: 200 10*3/uL (ref 150–440)
RBC: 3.41 MIL/uL — AB (ref 3.80–5.20)
RDW: 14.7 % — ABNORMAL HIGH (ref 11.5–14.5)
WBC: 10.3 10*3/uL (ref 3.6–11.0)

## 2016-05-10 LAB — COMPREHENSIVE METABOLIC PANEL
ALBUMIN: 2.9 g/dL — AB (ref 3.5–5.0)
ALK PHOS: 114 U/L (ref 38–126)
ALT: 44 U/L (ref 14–54)
AST: 55 U/L — AB (ref 15–41)
Anion gap: 9 (ref 5–15)
BUN: 10 mg/dL (ref 6–20)
CALCIUM: 8.1 mg/dL — AB (ref 8.9–10.3)
CO2: 23 mmol/L (ref 22–32)
CREATININE: 0.7 mg/dL (ref 0.44–1.00)
Chloride: 105 mmol/L (ref 101–111)
GFR calc Af Amer: >60 mL/min (ref >60)
GFR calc non Af Amer: >60 mL/min (ref >60)
Glucose, Bld: 91 mg/dL (ref 65–99)
Potassium: 2.9 mmol/L — ABNORMAL LOW (ref 3.5–5.1)
SODIUM: 137 mmol/L (ref 135–145)
Total Bilirubin: 0.6 mg/dL (ref 0.3–1.2)
Total Protein: 6.1 g/dL — ABNORMAL LOW (ref 6.5–8.1)

## 2016-05-10 LAB — URIC ACID: URIC ACID, SERUM: 4.4 mg/dL (ref 2.3–6.6)

## 2016-05-10 LAB — LACTATE DEHYDROGENASE: LDH: 214 U/L — AB (ref 98–192)

## 2016-05-10 LAB — LACTIC ACID, PLASMA: LACTIC ACID, VENOUS: 0.8 mmol/L (ref 0.5–1.9)

## 2016-05-10 LAB — URINALYSIS, COMPLETE (UACMP) WITH MICROSCOPIC
BACTERIA UA: NONE SEEN
BILIRUBIN URINE: NEGATIVE
Glucose, UA: NEGATIVE mg/dL
Ketones, ur: NEGATIVE mg/dL
Leukocytes, UA: NEGATIVE
Nitrite: NEGATIVE
PROTEIN: NEGATIVE mg/dL
SPECIFIC GRAVITY, URINE: 1.009 (ref 1.005–1.030)
pH: 8 (ref 5.0–8.0)

## 2016-05-10 LAB — INFLUENZA PANEL BY PCR (TYPE A & B)
Influenza A By PCR: NEGATIVE
Influenza B By PCR: NEGATIVE

## 2016-05-10 MED ORDER — POTASSIUM CHLORIDE CRYS ER 20 MEQ PO TBCR
40.0000 meq | EXTENDED_RELEASE_TABLET | Freq: Once | ORAL | Status: AC
  Administered 2016-05-10: 40 meq via ORAL
  Filled 2016-05-10: qty 2

## 2016-05-10 MED ORDER — CLINDAMYCIN HCL 300 MG PO CAPS: 300.0000 mg | ORAL_CAPSULE | Freq: Three times a day (TID) | ORAL | 0 refills | Status: DC

## 2016-05-10 MED ORDER — CLINDAMYCIN PHOSPHATE 600 MG/50ML IV SOLN
600.0000 mg | Freq: Once | INTRAVENOUS | Status: AC
  Administered 2016-05-10: 600 mg via INTRAVENOUS
  Filled 2016-05-10: qty 50

## 2016-05-10 NOTE — ED Notes (Signed)
Brought patient breast pump from OBGYN, and demonstrated use.

## 2016-05-10 NOTE — ED Provider Notes (Signed)
[facility] Emergency Department Provider Note   ____________________________________________   First MD Initiated Contact with Patient 05/09/16 2327     (approximate)  I have reviewed the triage vital signs and the nursing notes.   HISTORY  Chief Complaint Abdominal Pain; Postpartum Complications; and Fever    HPI Isabel Higgins is a 21 y.o. female who presents to the ED from home with a chief complaint of fever, abdominal pain, headache, dizziness. Patient is 4 days postpartum status post NSVD. Reports she was induced for elevated blood pressure. Denies being on blood pressure medications or magnesium surrounding the delivery. Patient is breast-feeding. Reports a one-day history of the above symptoms. Denies neck pain, vision changes, chest pain, cough, shortness of breath, vomiting, dysuria, diarrhea. States she did not tear nor require sutures. Denies recent travel or trauma. Nothing makes her symptoms better or worse.   Past Medical History:  Diagnosis Date  . Bradycardia   . Depression   . Mental disorder   . Psoriasis     Patient Active Problem List   Diagnosis Date Noted  . Postpartum care following vaginal delivery 05/05/2016  . Gestational hypertension 05/04/2016  . Abdominal pain affecting pregnancy 05/01/2016  . Indication for care in labor and delivery, antepartum 04/09/2016  . Swelling 04/06/2016  . Irregular contractions 03/31/2016  . Vaginal bleeding in pregnancy 02/06/2016  . Labor and delivery, indication for care 01/20/2016  . Chest pain 03/06/2012  . POTS (postural orthostatic tachycardia syndrome) 03/06/2012    Past Surgical History:  Procedure Laterality Date  . INNER EAR SURGERY     right  . SHOULDER SURGERY     right    Prior to Admission medications   Medication Sig Start Date End Date Taking? Authorizing Provider  calcium carbonate (TUMS - DOSED IN MG ELEMENTAL CALCIUM) 500 MG chewable tablet Chew 1 tablet by  mouth daily.   Yes Historical Provider, MD  famotidine (PEPCID) 20 MG tablet Take 20 mg by mouth 2 (two) times daily.   Yes Historical Provider, MD  Prenatal Vit-Fe Fumarate-FA (PRENATAL MULTIVITAMIN) TABS tablet Take 1 tablet by mouth daily at 12 noon.   Yes Historical Provider, MD  clindamycin (CLEOCIN) 300 MG capsule Take 1 capsule (300 mg total) by mouth 3 (three) times daily. 05/10/16   Irean Hong, MD  pantoprazole (PROTONIX) 40 MG tablet Take 1 tablet (40 mg total) by mouth 2 (two) times daily before a meal. Patient not taking: Reported on 05/04/2016 04/10/16   Nadara Mustard, MD    Allergies Amoxicillin and Honeysuckle flower [lonicera]  Family History  Problem Relation Age of Onset  . Hypertension Father   . Diabetes Maternal Grandfather   . Heart attack Maternal Grandmother     x5  . Diabetes Paternal Grandfather     Social History Social History  Substance Use Topics  . Smoking status: Never Smoker  . Smokeless tobacco: Never Used  . Alcohol use No    Review of Systems  Constitutional: Positive for fever. Eyes: No visual changes. ENT: No sore throat. Cardiovascular: Denies chest pain. Respiratory: Denies shortness of breath. Gastrointestinal: Positive for abdominal pain.  No nausea, no vomiting.  No diarrhea.  No constipation. Genitourinary: Negative for dysuria. Musculoskeletal: Negative for back pain. Skin: Negative for rash. Neurological: Positive for headache. Negative for focal weakness or numbness.  10-point ROS otherwise negative.  ____________________________________________   PHYSICAL EXAM:  VITAL SIGNS: ED Triage Vitals  Enc Vitals Group  BP 05/09/16 2305 (!) 142/78     Pulse Rate 05/09/16 2305 (!) 110     Resp 05/09/16 2305 20     Temp 05/09/16 2308 (!) 101 F (38.3 C)     Temp Source 05/09/16 2305 Oral     SpO2 05/09/16 2305 100 %     Weight 05/09/16 2305 220 lb (99.8 kg)     Height 05/09/16 2305 5\' 7"  (1.702 m)     Head  Circumference --      Peak Flow --      Pain Score 05/09/16 2306 8     Pain Loc --      Pain Edu? --      Excl. in GC? --     Constitutional: Alert and oriented. Well appearing and in mild acute distress. Eyes: Conjunctivae are normal. PERRL. EOMI. Head: Atraumatic. Nose: No congestion/rhinnorhea. Mouth/Throat: Mucous membranes are moist.  Oropharynx non-erythematous. Neck: No stridor.  Supple neck without meningismus. Cardiovascular: Normal rate, regular rhythm. Grossly normal heart sounds.  Good peripheral circulation. Respiratory: Normal respiratory effort.  No retractions. Lungs CTAB. Breasts: No warmth/erythema to indicate mastitis on either breast. Gastrointestinal: Soft and mildly tender to palpation suprapubic area without rebound or guarding. No distention. No abdominal bruits. No CVA tenderness. Musculoskeletal: No lower extremity tenderness nor edema.  No joint effusions. Neurologic:  Normal speech and language. No gross focal neurologic deficits are appreciated.  Skin:  Skin is warm, dry and intact. No rash noted. No petechiae. Psychiatric: Mood and affect are normal. Speech and behavior are normal.  ____________________________________________   LABS (all labs ordered are listed, but only abnormal results are displayed)  Labs Reviewed  CBC WITH DIFFERENTIAL/PLATELET - Abnormal; Notable for the following:       Result Value   RBC 3.41 (*)    Hemoglobin 10.7 (*)    HCT 30.1 (*)    RDW 14.7 (*)    Neutro Abs 8.2 (*)    All other components within normal limits  COMPREHENSIVE METABOLIC PANEL - Abnormal; Notable for the following:    Potassium 2.9 (*)    Calcium 8.1 (*)    Total Protein 6.1 (*)    Albumin 2.9 (*)    AST 55 (*)    All other components within normal limits  URINALYSIS, COMPLETE (UACMP) WITH MICROSCOPIC - Abnormal; Notable for the following:    Color, Urine STRAW (*)    APPearance CLEAR (*)    Hgb urine dipstick SMALL (*)    Squamous Epithelial /  LPF 0-5 (*)    All other components within normal limits  LACTATE DEHYDROGENASE - Abnormal; Notable for the following:    LDH 214 (*)    All other components within normal limits  CULTURE, BLOOD (ROUTINE X 2)  CULTURE, BLOOD (ROUTINE X 2)  LACTIC ACID, PLASMA  INFLUENZA PANEL BY PCR (TYPE A & B)  URIC ACID   ____________________________________________  EKG  None ____________________________________________  RADIOLOGY  Pelvic ultrasound interpreted per Dr. Cherly Hensenhang: 1. Enlarged postpartum uterus noted. Diffusely thickened and  heterogeneous endometrial echo complex, with fluid tracking along  the endometrial canal. No evidence for retained products of  conception.  2. Ovaries unremarkable in appearance.   ____________________________________________   PROCEDURES  Procedure(s) performed: None  Procedures  Critical Care performed: No  ____________________________________________   INITIAL IMPRESSION / ASSESSMENT AND PLAN / ED COURSE  Pertinent labs & imaging results that were available during my care of the patient were reviewed by me and  considered in my medical decision making (see chart for details).  21 year old female who is 4 days postpartum status post NSVD who presents with fever, abdominal pain, headache and dizziness. Induced secondary to gestational hypertension with an uncomplicated labor course and discharged with normal blood pressure with no signs or symptoms of postpartum preeclampsia. Blood pressure here is elevated 129/103 although currently patient complains of very mild headache with abdominal pain and declines analgesics. Will obtain pelvic ultrasound to evaluate for retained POC/endometritis, continue IV fluid resuscitation. Initial laboratory results remarkable for mild hypokalemia, normal lactate, normal WBC. Blood cultures obtained, awaiting influenza result. Will discuss with on-call OB/GYN for [facility] after ultrasound results. Will continue to  closely monitor patient's blood pressure and consider magnesium infusion as needed.  Clinical Course as of May 10 721  Fri May 10, 2016  0250 Blood pressure normalized. Patient resting in no acute distress. Updated her of laboratory and imaging results. Discussed findings of ultrasound with Dr. Chauncey Cruel who recommends initiating clindamycin 300 mg 3 times a day for possible developing endometritis. Patient will received an IV dose in the emergency department and prescription for oral clindamycin and she will have close follow-up with OB/GYN. Strict return precautions given. Patient verbalizes understanding and agrees with plan of care.  [JS]    Clinical Course User Index [JS] Irean Hong, MD     ____________________________________________   FINAL CLINICAL IMPRESSION(S) / ED DIAGNOSES  Final diagnoses:  Fever, unspecified fever cause  Lower abdominal pain      NEW MEDICATIONS STARTED DURING THIS VISIT:  Discharge Medication List as of 05/10/2016  4:03 AM    START taking these medications   Details  clindamycin (CLEOCIN) 300 MG capsule Take 1 capsule (300 mg total) by mouth 3 (three) times daily., Starting Fri 05/10/2016, Print         Note:  This document was prepared using Dragon voice recognition software and may include unintentional dictation errors.    Irean Hong, MD 05/10/16 [phone]

## 2016-05-10 NOTE — Discharge Instructions (Signed)
1. You may alternate Tylenol and Motrin every 4 hours as needed for fever greater than 100.89F. 2. Start antibiotic as prescribed for suspected infection in your uterus (clindamycin 300 mg 3 times daily 10 days). Area did return to the ER for worsening symptoms, persistent vomiting, difficulty breathing or other concerns.

## 2016-05-10 NOTE — ED Notes (Signed)
MD Sung at bedside. 

## 2016-05-15 LAB — CULTURE, BLOOD (ROUTINE X 2)
Culture: NO GROWTH
Culture: NO GROWTH

## 2016-06-19 ENCOUNTER — Encounter: Payer: Self-pay | Admitting: Obstetrics & Gynecology

## 2016-07-31 ENCOUNTER — Telehealth: Payer: Self-pay

## 2016-07-31 NOTE — Telephone Encounter (Signed)
Pt states she started the progesterone only OCP at her 6 wk pp in march and has been exclusively breastfeeding. Pt c/o bleeding since day 4 of taking OCP and uses about 3 tampons per day. She has had only a few days where bleeding did not occur. Pt reports taking pill at the same time each day and has also experienced headaches and some dizziness. Pt feeling okay today but concerned about ongoing bleeding. Please advise. Pt cb# [phone] thank you

## 2016-08-01 NOTE — Telephone Encounter (Signed)
Pt aware and transferred to front desk for scheduling.  

## 2016-08-01 NOTE — Telephone Encounter (Signed)
[facility] G[facility] US and then a follow up visit thereafter to assess uterus.  Cont pill for now.

## 2016-08-07 ENCOUNTER — Ambulatory Visit (INDEPENDENT_AMBULATORY_CARE_PROVIDER_SITE_OTHER): Payer: Self-pay

## 2016-08-07 ENCOUNTER — Ambulatory Visit (INDEPENDENT_AMBULATORY_CARE_PROVIDER_SITE_OTHER): Payer: Medicaid Other | Admitting: Obstetrics & Gynecology

## 2016-08-07 ENCOUNTER — Other Ambulatory Visit: Payer: Self-pay | Admitting: Obstetrics & Gynecology

## 2016-08-07 ENCOUNTER — Encounter: Payer: Self-pay | Admitting: Obstetrics & Gynecology

## 2016-08-07 VITALS — BP 130/80 | HR 72 | Ht 67.0 in | Wt 210.0 lb

## 2016-08-07 DIAGNOSIS — R000 Tachycardia, unspecified: Principal | ICD-10-CM

## 2016-08-07 DIAGNOSIS — I951 Orthostatic hypotension: Principal | ICD-10-CM

## 2016-08-07 DIAGNOSIS — G90A Postural orthostatic tachycardia syndrome (POTS): Secondary | ICD-10-CM

## 2016-08-07 DIAGNOSIS — N921 Excessive and frequent menstruation with irregular cycle: Secondary | ICD-10-CM

## 2016-08-07 DIAGNOSIS — Z3049 Encounter for surveillance of other contraceptives: Secondary | ICD-10-CM | POA: Diagnosis not present

## 2016-08-07 HISTORY — DX: Excessive and frequent menstruation with irregular cycle: N92.1

## 2016-08-07 MED ORDER — MEDROXYPROGESTERONE ACETATE 10 MG PO TABS: 10.0000 mg | ORAL_TABLET | Freq: Every day | ORAL | 0 refills | Status: DC

## 2016-08-07 NOTE — Progress Notes (Signed)
  HPI: Bleeding PP despite hormonal BC therapy.  2 mos on pills now.  Ultrasound demonstrates no masses seen.  No abn thickening or retained POC. These findings are Pelvis normal  PMHx: She  has a past medical history of Bradycardia; Depression; Mental disorder; and Psoriasis. Also,  has a past surgical history that includes Inner ear surgery and Shoulder surgery., family history includes Diabetes in her maternal grandfather and paternal grandfather; Heart attack in her maternal grandmother; Hypertension in her father.,  reports that she has never smoked. She has never used smokeless tobacco. She reports that she does not drink alcohol or use drugs.  She has a current medication list which includes the following prescription(s): norethindrone-ethinyl estradiol 1/35, calcium carbonate, clindamycin, famotidine, medroxyprogesterone, pantoprazole, and prenatal multivitamin. Also, is allergic to amoxicillin and honeysuckle flower [lonicera].  Review of Systems  Constitutional: Negative for chills, fever and malaise/fatigue.  HENT: Negative for congestion, sinus pain and sore throat.   Eyes: Negative for blurred vision and pain.  Respiratory: Negative for cough and wheezing.   Cardiovascular: Negative for chest pain and leg swelling.  Gastrointestinal: Negative for abdominal pain, constipation, diarrhea, heartburn, nausea and vomiting.  Genitourinary: Negative for dysuria, frequency, hematuria and urgency.  Musculoskeletal: Negative for back pain, joint pain, myalgias and neck pain.  Skin: Negative for itching and rash.  Neurological: Negative for dizziness, tremors and weakness.  Endo/Heme/Allergies: Does not bruise/bleed easily.  Psychiatric/Behavioral: Negative for depression. The patient is not nervous/anxious and does not have insomnia.     Objective: BP 130/80   Pulse 72   Ht 5\' 7"  (1.702 m)   Wt 210 lb (95.3 kg)   BMI 32.89 kg/m   Physical examination Constitutional NAD,  Conversant  Skin No rashes, lesions or ulceration.   Extremities: Moves all appropriately.  Normal ROM for age. No lymphadenopathy.  Neuro: Grossly intact  Psych: Oriented to PPT.  Normal mood. Normal affect.   Assessment:  POTS (postural orthostatic tachycardia syndrome)  Menometrorrhagia  Keep appt for PAP and consider Mirena at that time. All options d/w pt including Depo and Nexplanon.  Annamarie MajorPaul Jeramey Lanuza, MD, Merlinda FrederickFACOG [facility], [facility] 08/07/2016  3:12 PM

## 2016-08-20 ENCOUNTER — Other Ambulatory Visit: Payer: Self-pay

## 2016-08-20 ENCOUNTER — Ambulatory Visit: Payer: Self-pay | Admitting: Obstetrics and Gynecology

## 2016-08-21 ENCOUNTER — Ambulatory Visit (INDEPENDENT_AMBULATORY_CARE_PROVIDER_SITE_OTHER): Payer: Medicaid Other | Admitting: Obstetrics & Gynecology

## 2016-08-21 ENCOUNTER — Encounter: Payer: Self-pay | Admitting: Obstetrics & Gynecology

## 2016-08-21 ENCOUNTER — Telehealth: Payer: Self-pay

## 2016-08-21 VITALS — BP 110/70 | HR 99 | Ht 67.0 in | Wt 210.0 lb

## 2016-08-21 DIAGNOSIS — Z124 Encounter for screening for malignant neoplasm of cervix: Secondary | ICD-10-CM

## 2016-08-21 DIAGNOSIS — Z30430 Encounter for insertion of intrauterine contraceptive device: Secondary | ICD-10-CM | POA: Diagnosis not present

## 2016-08-21 DIAGNOSIS — Z0000 Encounter for general adult medical examination without abnormal findings: Secondary | ICD-10-CM | POA: Diagnosis not present

## 2016-08-21 DIAGNOSIS — Z308 Encounter for other contraceptive management: Secondary | ICD-10-CM

## 2016-08-21 NOTE — Progress Notes (Signed)
HPI:      Isabel Higgins is a 21 y.o. G1P1001 who LMP was last week, she presents today for her annual examination and IUD appt. The patient has no complaints today. The patient is sexually active. Her last pap: was normal. The patient does perform self breast exams.  There is no notable family history of breast or ovarian cancer in her family.  The patient has regular exercise: yes.  The patient denies current symptoms of depression.    GYN History: Contraception: oral progesterone-only contraceptive  PMHx: Past Medical History:  Diagnosis Date  . Bradycardia   . Depression   . Mental disorder   . Psoriasis    Past Surgical History:  Procedure Laterality Date  . INNER EAR SURGERY     right  . SHOULDER SURGERY     right   Family History  Problem Relation Age of Onset  . Hypertension Father   . Diabetes Maternal Grandfather   . Heart attack Maternal Grandmother        x5  . Diabetes Paternal Grandfather    Social History  Substance Use Topics  . Smoking status: Never Smoker  . Smokeless tobacco: Never Used  . Alcohol use No    Current Outpatient Prescriptions:  .  calcium carbonate (TUMS - DOSED IN MG ELEMENTAL CALCIUM) 500 MG chewable tablet, Chew 1 tablet by mouth daily., Disp: , Rfl:  .  clindamycin (CLEOCIN) 300 MG capsule, Take 1 capsule (300 mg total) by mouth 3 (three) times daily. (Patient not taking: Reported on 08/07/2016), Disp: 30 capsule, Rfl: 0 .  famotidine (PEPCID) 20 MG tablet, Take 20 mg by mouth 2 (two) times daily., Disp: , Rfl:  .  medroxyPROGESTERone (PROVERA) 10 MG tablet, Take 1 tablet (10 mg total) by mouth daily. As needed for worsening bleeding., Disp: 10 tablet, Rfl: 0 .  norethindrone-ethinyl estradiol 1/35 (ORTHO-NOVUM, NORTREL,CYCLAFEM) tablet, Take 1 tablet by mouth daily., Disp: , Rfl:  .  pantoprazole (PROTONIX) 40 MG tablet, Take 1 tablet (40 mg total) by mouth 2 (two) times daily before a meal. (Patient not taking: Reported on  05/04/2016), Disp: 60 tablet, Rfl: 1 .  Prenatal Vit-Fe Fumarate-FA (PRENATAL MULTIVITAMIN) TABS tablet, Take 1 tablet by mouth daily at 12 noon., Disp: , Rfl:  Allergies: Honeysuckle flower [lonicera] and Amoxicillin  Review of Systems  Constitutional: Negative for chills, fever and malaise/fatigue.  HENT: Negative for congestion, sinus pain and sore throat.   Eyes: Negative for blurred vision and pain.  Respiratory: Negative for cough and wheezing.   Cardiovascular: Negative for chest pain and leg swelling.  Gastrointestinal: Negative for abdominal pain, constipation, diarrhea, heartburn, nausea and vomiting.  Genitourinary: Negative for dysuria, frequency, hematuria and urgency.  Musculoskeletal: Negative for back pain, joint pain, myalgias and neck pain.  Skin: Negative for itching and rash.  Neurological: Negative for dizziness, tremors and weakness.  Endo/Heme/Allergies: Does not bruise/bleed easily.  Psychiatric/Behavioral: Negative for depression. The patient is not nervous/anxious and does not have insomnia.     Objective: BP 110/70   Pulse 99   Ht 5\' 7"  (1.702 m)   Wt 210 lb (95.3 kg)   BMI 32.89 kg/m   Filed Weights   08/21/16 0934  Weight: 210 lb (95.3 kg)   Body mass index is 32.89 kg/m. Physical Exam  Constitutional: She is oriented to person, place, and time. She appears well-developed and well-nourished. No distress.  Genitourinary: Rectum normal, vagina normal and uterus normal. Pelvic exam was  performed with patient supine. There is no rash or lesion on the right labia. There is no rash or lesion on the left labia. Vagina exhibits no lesion. No bleeding in the vagina. Right adnexum does not display mass and does not display tenderness. Left adnexum does not display mass and does not display tenderness. Cervix does not exhibit motion tenderness, lesion, friability or polyp.   Uterus is mobile and midaxial. Uterus is not enlarged or exhibiting a mass.  HENT:  Head:  Normocephalic and atraumatic. Head is without laceration.  Right Ear: Hearing normal.  Left Ear: Hearing normal.  Nose: No epistaxis.  No foreign bodies.  Mouth/Throat: Uvula is midline, oropharynx is clear and moist and mucous membranes are normal.  Eyes: Pupils are equal, round, and reactive to light.  Neck: Normal range of motion. Neck supple. No thyromegaly present.  Cardiovascular: Normal rate and regular rhythm.  Exam reveals no gallop and no friction rub.   No murmur heard. Pulmonary/Chest: Effort normal and breath sounds normal. No respiratory distress. She has no wheezes. Right breast exhibits no mass, no skin change and no tenderness. Left breast exhibits no mass, no skin change and no tenderness.  Abdominal: Soft. Bowel sounds are normal. She exhibits no distension. There is no tenderness. There is no rebound.  Musculoskeletal: Normal range of motion.  Neurological: She is alert and oriented to person, place, and time. No cranial nerve deficit.  Skin: Skin is warm and dry.  Psychiatric: She has a normal mood and affect. Judgment normal.  Vitals reviewed.   Assessment:  ANNUAL EXAM 1. Annual physical exam   2. Encounter for insertion of mirena IUD   3. Screening for cervical cancer      Screening Plan:            1.  Cervical Screening-  Pap smear done today  2.Labs managed by PCP  3. Counseling for contraception: IUD  Other:  1. Annual physical exam  2. Encounter for insertion of mirena IUD  3. Screening for cervical cancer - IGP,CtNgTv,rfx Aptima HPV ASCU   IUD PROCEDURE NOTE:  Isabel Higgins is a 21 y.o. G1P1001 here for IUD insertion. No GYN concerns.  Last pap smear was today.  IUD Insertion Procedure Note Patient identified, informed consent performed, consent signed.   Discussed risks of irregular bleeding, cramping, infection, malpositioning or misplacement of the IUD outside the uterus which may require further procedure such as laparoscopy, risk  of failure <1%. Time out was performed.  Urine pregnancy test negative.  A bimanual exam showed the uterus to be midposition.  Speculum placed in the vagina.  Cervix visualized.  Cleaned with Betadine x 2.  Grasped anteriorly with a single tooth tenaculum.  Uterus sounded to 6 cm.   IUD placed per manufacturer's recommendations.  Strings trimmed to 3 cm. Tenaculum was removed, good hemostasis noted.  Patient tolerated procedure well.   Patient was given post-procedure instructions.  She was advised to have backup contraception for one week.  Patient was also asked to check IUD strings periodically and follow up in 4 weeks for IUD check.    F/U  Return for Follow up IUD.  Annamarie Major, MD, [facility], [facility] 08/21/2016  9:50 AM

## 2016-08-21 NOTE — Patient Instructions (Signed)

## 2016-08-21 NOTE — Telephone Encounter (Signed)
Pt calling.  Had pap and IUD inserted earlier today.  Now has right side cramping really bad.  Is taking tylenol and using a heating pad.  Is there anything else she can do and is the cramping normal?  [phone]

## 2016-08-22 NOTE — Telephone Encounter (Signed)
Pt states she is ok cramping has gotten better and so has the bleeding, pt aware this is normal

## 2016-08-23 LAB — IGP,CTNGTV,RFX APTIMA HPV ASCU
Chlamydia, Nuc. Acid Amp: NEGATIVE
Gonococcus, Nuc. Acid Amp: NEGATIVE
PAP SMEAR COMMENT: 0
Trich vag by NAA: NEGATIVE

## 2016-09-18 ENCOUNTER — Ambulatory Visit (INDEPENDENT_AMBULATORY_CARE_PROVIDER_SITE_OTHER): Payer: Medicaid Other | Admitting: Obstetrics & Gynecology

## 2016-09-18 ENCOUNTER — Encounter: Payer: Self-pay | Admitting: Obstetrics & Gynecology

## 2016-09-18 VITALS — BP 120/80 | HR 71 | Ht 67.0 in | Wt 214.0 lb

## 2016-09-18 DIAGNOSIS — Z3049 Encounter for surveillance of other contraceptives: Secondary | ICD-10-CM

## 2016-09-18 DIAGNOSIS — N921 Excessive and frequent menstruation with irregular cycle: Secondary | ICD-10-CM

## 2016-09-18 NOTE — Progress Notes (Signed)
  History of Present Illness:  Isabel Higgins is a 21 y.o. that had a Mirena IUD placed approximately 4 weeks ago. Since that time, she states that she has had some irreg bleeding off and on.  PMHx: She  has a past medical history of Bradycardia; Depression; Mental disorder; and Psoriasis. Also,  has a past surgical history that includes Inner ear surgery and Shoulder surgery., family history includes Diabetes in her maternal grandfather and paternal grandfather; Heart attack in her maternal grandmother; Hypertension in her father.,  reports that she has never smoked. She has never used smokeless tobacco. She reports that she does not drink alcohol or use drugs. Current Meds  Medication Sig  . levonorgestrel (MIRENA) 20 MCG/24HR IUD 1 each by Intrauterine route once.  .  Also, is allergic to honeysuckle flower [lonicera] and amoxicillin..  Review of Systems  All other systems reviewed and are negative.   Physical Exam:  BP 120/80   Pulse 71   Ht 5\' 7"  (1.702 m)   Wt 214 lb (97.1 kg)   BMI 33.52 kg/m  Body mass index is 33.52 kg/m. Constitutional: Well nourished, well developed female in no acute distress.  Abdomen: diffusely non tender to palpation, non distended, and no masses, hernias Neuro: Grossly intact Psych:  Normal mood and affect.    Pelvic exam:  Two IUD strings present seen coming from the cervical os. EGBUS, vaginal vault and cervix: within normal limits  Assessment: IUD strings present in proper location; pt doing well  Plan: She was told to continue to use barrier contraception, in order to prevent any STIs, and to take a home pregnancy test or call us if she ever thinks she may be pregnant, and that her IUD expires in 5 years.  She was amenable to this plan and we will see her back in 1 year/PRN.  Annamarie MajorPaul Edwina Grossberg, MD, [facility], [facility] 09/18/2016  11:22 AM

## 2016-10-29 ENCOUNTER — Telehealth: Payer: Self-pay

## 2016-10-29 NOTE — Telephone Encounter (Signed)
IUD placed in June to try to help.  Can give it full 3 mos to see if works to slow down bleeding, or move on to another option for period control (pill, shot).  Discuss with her to see what she wants to do.

## 2016-10-29 NOTE — Telephone Encounter (Signed)
Pt c/o abnormal bleeding since delivery in February. Per pt she was instructed to call if bleeding continued and she is having to use 4-5 tampons per day and use a pad. Please advise. Thank you.

## 2016-10-30 NOTE — Telephone Encounter (Signed)
Pt aware and plans to discuss with her mother and husband to decide what she wants to do and will call back.

## 2016-12-17 ENCOUNTER — Telehealth: Payer: Self-pay

## 2016-12-17 NOTE — Telephone Encounter (Signed)
Pt is schedule 12/18/16 with AMS

## 2016-12-17 NOTE — Telephone Encounter (Signed)
PT called triage line stating she is breastfeeding and her breast is red/painful and has a sore on it that looks like a boil. CB# [phone]  Please call her and get her in with a provider for Mastitis., Can be ABC if she has openings. KJ CMA (AAMA)

## 2016-12-18 ENCOUNTER — Encounter: Payer: Self-pay | Admitting: Obstetrics and Gynecology

## 2016-12-18 ENCOUNTER — Ambulatory Visit (INDEPENDENT_AMBULATORY_CARE_PROVIDER_SITE_OTHER): Payer: Medicaid Other | Admitting: Obstetrics and Gynecology

## 2016-12-18 VITALS — BP 110/68 | HR 68 | Temp 97.7°F | Ht 67.0 in | Wt 222.0 lb

## 2016-12-18 DIAGNOSIS — N611 Abscess of the breast and nipple: Secondary | ICD-10-CM

## 2016-12-18 MED ORDER — DICLOXACILLIN SODIUM 500 MG PO CAPS: 500.0000 mg | ORAL_CAPSULE | Freq: Four times a day (QID) | ORAL | 0 refills | Status: AC

## 2016-12-18 NOTE — Progress Notes (Signed)
Obstetrics & Gynecology Office Visit   Chief Complaint:  Chief Complaint  Patient presents with  . mastitis    Right breast pain/blister on outside    History of Present Illness: 21 yo G1P1001 presenting for possible right lactational mastitis.  She reports a similar episode several months ago that involved a small spot on the right breast, which self resolved with warm compresses.  Over the past few days she has noted a small "blister" develop on the right upper outer quadrant of her breast.  This has become red and painful.  She denies fevers, chills.  No inciting trauma that the patient can recall. Pain is 5/10 on severity.     Review of Systems: 10 point review of systems negative unless otherwise noted in HPI  Past Medical History:  Past Medical History:  Diagnosis Date  . Bradycardia   . Depression   . Mental disorder   . Psoriasis     Past Surgical History:  Past Surgical History:  Procedure Laterality Date  . INNER EAR SURGERY     right  . SHOULDER SURGERY     right    Gynecologic History: No LMP recorded. Patient is not currently having periods (Reason: IUD).  Obstetric History: G1P1001  Family History:  Family History  Problem Relation Age of Onset  . Hypertension Father   . Diabetes Maternal Grandfather   . Heart attack Maternal Grandmother        x5  . Diabetes Paternal Grandfather     Social History:  Social History   Social History  . Marital status: Married    Spouse name: N/A  . Number of children: N/A  . Years of education: N/A   Occupational History  . Not on file.   Social History Main Topics  . Smoking status: Never Smoker  . Smokeless tobacco: Never Used  . Alcohol use No  . Drug use: No  . Sexual activity: No   Other Topics Concern  . Not on file   Social History Narrative  . No narrative on file    Allergies:  Allergies  Allergen Reactions  . Honeysuckle Flower [Lonicera] Swelling, Rash and Other (See Comments)      Other Reaction: BREAK OUT, FACIAL SWELLING, WE  . Amoxicillin Nausea And Vomiting    Medications: Prior to Admission medications   Medication Sig Start Date End Date Taking? Authorizing Provider  levonorgestrel (MIRENA) 20 MCG/24HR IUD 1 each by Intrauterine route once.   Yes [provider]  dicloxacillin (DYNAPEN) 500 MG capsule Take 1 capsule (500 mg total) by mouth 4 (four) times daily. 12/18/16 12/28/16  Vena Austria, MD  medroxyPROGESTERone (PROVERA) 10 MG tablet Take 1 tablet (10 mg total) by mouth daily. As needed for worsening bleeding. 08/07/16 08/17/16  Nadara Mustard, MD    Physical Exam Vitals:  Vitals:   12/18/16 0912  BP: 110/68  Pulse: 68  Temp: 97.7 F (36.5 C)   No LMP recorded. Patient is not currently having periods (Reason: IUD).  General: NAD HEENT: normocephalic, anicteric Breast: symmetrical, mild  Tenderness over area in question, no palpable nodules or masses, no skin or nipple retraction present, galactorrhea discharge.  There is a small slightly raised pustule with a a small area of surrounding erythema that and minimal induration in the upper outer quadrant of the right breast approximately 6 cm from the areola.  No axillary or supraclavicular lymphadenopathy. Pulmonary: No increased work of breathing Extremities: no edema, erythema,  or tenderness Neurologic: Grossly intact Psychiatric: mood appropriate, affect full  Physical Exam  Cardiovascular:       Female chaperone present for  breast  portions of the physical exam  Assessment: 21 y.o. G1P1001 with small furuncle of the breast  Plan: Problem List Items Addressed This Visit    None    Visit Diagnoses    Furuncle of female breast    -  Primary     - Does not appear classic for lactational mastitis but more consistent with a furuncle  - PCN allergy is only nausea will try diclxacillin, should she experience nausea we discussed bactrim DS or clindamycin as alternative  regimens - Continue warm compresses to the area, prn ibuprofen or other NSAID, and loose fitting but supportive bra - A total of 15 minutes were spent in face-to-face contact with the patient during this encounter with over half of that time devoted to counseling and coordination of care.

## 2017-01-01 ENCOUNTER — Encounter: Payer: Self-pay | Admitting: Maternal Newborn

## 2017-01-01 ENCOUNTER — Ambulatory Visit (INDEPENDENT_AMBULATORY_CARE_PROVIDER_SITE_OTHER): Payer: Self-pay | Admitting: Maternal Newborn

## 2017-01-01 ENCOUNTER — Ambulatory Visit: Payer: Medicaid Other | Admitting: Obstetrics and Gynecology

## 2017-01-01 VITALS — BP 112/68 | HR 80 | Ht 67.0 in | Wt 221.0 lb

## 2017-01-01 DIAGNOSIS — O99345 Other mental disorders complicating the puerperium: Principal | ICD-10-CM

## 2017-01-01 DIAGNOSIS — F53 Postpartum depression: Secondary | ICD-10-CM

## 2017-01-01 MED ORDER — SERTRALINE HCL 50 MG PO TABS: 50.0000 mg | ORAL_TABLET | Freq: Every day | ORAL | 2 refills | Status: DC

## 2017-01-01 NOTE — Progress Notes (Signed)
Obstetrics & Gynecology Office Visit   Chief Complaint  Patient presents with  . Follow-up    mastitis and post partum depression    History of Present Illness: The patient is a 21 y.o. female presenting for follow up for mastitis and symptoms of depression.  The patient is currently taking no medications for the management of her symptoms.  She has had recent situational stressors of childbirth and postpartum.  She reports symptoms of anhedonia, day time somnolence, insomnia, irritability, feelings of guilt and feelings of worthlessness.  She denies risk taking behavior, agorophobia, suicidal ideation and homicidal ideation. Symptoms of depression have worsened since last visit.     The patient does have a pre-existing history of depression and anxiety.  She  does not a prior history of suicide attempts.  Previous treatment (before pregnancy) includes Zoloft.   For mastitis, the patient was treated with antibiotics and states that the affected area on her right breast has continued to improve and is no longer painful.  Review of Systems: 10 point review of systems negative unless otherwise noted in HPI.  Past Medical History:  Past Medical History:  Diagnosis Date  . Bradycardia   . Depression   . Mental disorder   . Psoriasis     Past Surgical History:  Past Surgical History:  Procedure Laterality Date  . INNER EAR SURGERY     right  . SHOULDER SURGERY     right    Gynecologic History: Patient's last menstrual period was 12/29/2016 (exact date).  Obstetric History: G1P1001  Family History:  Family History  Problem Relation Age of Onset  . Hypertension Father   . Diabetes Maternal Grandfather   . Heart attack Maternal Grandmother        x5  . Diabetes Paternal Grandfather     Social History:  Social History   Social History  . Marital status: Married    Spouse name: N/A  . Number of children: N/A  . Years of education: N/A   Occupational History  . Not  on file.   Social History Main Topics  . Smoking status: Never Smoker  . Smokeless tobacco: Never Used  . Alcohol use No  . Drug use: No  . Sexual activity: No   Other Topics Concern  . Not on file   Social History Narrative  . No narrative on file    Allergies:  Allergies  Allergen Reactions  . Honeysuckle Flower [Lonicera] Swelling, Rash and Other (See Comments)    Other Reaction: BREAK OUT, FACIAL SWELLING, WE  . Amoxicillin Nausea And Vomiting    Medications: Prior to Admission medications   Medication Sig Start Date End Date Taking? Authorizing Provider  levonorgestrel (MIRENA) 20 MCG/24HR IUD 1 each by Intrauterine route once.   Yes [provider]  Prenatal Vit-Fe Fumarate-FA (MULTIVITAMIN-PRENATAL) 27-0.8 MG TABS tablet Take 1 tablet by mouth daily at 12 noon.   Yes [provider]  sertraline (ZOLOFT) 50 MG tablet Take 1 tablet (50 mg total) by mouth daily. For the first 7 days, take 0.5 tablet (25 mg) once a day, then take 1 tablet (50 mg) once daily. 01/01/17   Oswaldo Conroy, CNM    Physical Exam Vitals:  Vitals:   01/01/17 1529  BP: 112/68  Pulse: 80   Patient's last menstrual period was 12/29/2016 (exact date).  General: tearful, expresses emotional distress HEENT: normocephalic, anicteric Breasts:  Lactating, symmetrical, no tenderness, small palpable raised pink lesion, healing, on  right breast,  no nodules or masses on left breast, no skin or nipple retraction present. No axillary or supraclavicular lymphadenopathy. Pulmonary: No increased work of breathing Extremities: no edema, erythema, or tenderness Neurologic: Grossly intact Psychiatric: mood appropriate, affect full  EPDS score today: 22  Assessment: 21 y.o. G1P1001 with well-healing breast lesion and worsening symptoms of postpartum depression.  Plan: Problem List Items Addressed This Visit   Visit Diagnoses    Postpartum depression    -  Primary   Relevant  Medications   sertraline (ZOLOFT) 50 MG tablet     Patient recalls taking sertaline for previous episodes of depression. Discussed that sertaline is considered safe for use in lactation. Rx for Zoloft 25 mg for 7 days, increasing to 50 mg afterwards. Follow up in four weeks to assess if medication is helping with postpartum depression. Call if worsening symptoms.  A total of 15 minutes were spent in face-to-face contact with the patient during this encounter with over half of that time devoted to counseling and coordination of care.  Marcelyn Bruins, CNM 01/01/2017  7:42 PM

## 2017-01-29 ENCOUNTER — Ambulatory Visit: Payer: Medicaid Other | Admitting: Maternal Newborn

## 2017-02-19 ENCOUNTER — Other Ambulatory Visit: Payer: Self-pay | Admitting: Maternal Newborn

## 2017-02-19 DIAGNOSIS — F53 Postpartum depression: Secondary | ICD-10-CM

## 2017-02-19 DIAGNOSIS — O99345 Other mental disorders complicating the puerperium: Principal | ICD-10-CM

## 2017-03-15 ENCOUNTER — Emergency Department
Admission: EM | Admit: 2017-03-15 | Discharge: 2017-03-16 | Disposition: A | Discharge status: Home / Self Care | Payer: 59 | Attending: Emergency Medicine | Admitting: Emergency Medicine

## 2017-03-15 DIAGNOSIS — N83202 Unspecified ovarian cyst, left side: Secondary | ICD-10-CM | POA: Diagnosis not present

## 2017-03-15 DIAGNOSIS — R102 Pelvic and perineal pain: Secondary | ICD-10-CM | POA: Diagnosis present

## 2017-03-15 DIAGNOSIS — Z79899 Other long term (current) drug therapy: Secondary | ICD-10-CM | POA: Diagnosis not present

## 2017-03-15 LAB — URINALYSIS, COMPLETE (UACMP) WITH MICROSCOPIC
BILIRUBIN URINE: NEGATIVE
Bacteria, UA: NONE SEEN
Glucose, UA: NEGATIVE mg/dL
Hgb urine dipstick: NEGATIVE
KETONES UR: NEGATIVE mg/dL
LEUKOCYTES UA: NEGATIVE
Nitrite: NEGATIVE
PH: 5 (ref 5.0–8.0)
Protein, ur: NEGATIVE mg/dL
Specific Gravity, Urine: 1.016 (ref 1.005–1.030)

## 2017-03-15 LAB — LIPASE, BLOOD: LIPASE: 25 U/L (ref 11–51)

## 2017-03-15 LAB — COMPREHENSIVE METABOLIC PANEL
ALT: 20 U/L (ref 14–54)
ANION GAP: 8 (ref 5–15)
AST: 21 U/L (ref 15–41)
Albumin: 4.2 g/dL (ref 3.5–5.0)
Alkaline Phosphatase: 121 U/L (ref 38–126)
BUN: 16 mg/dL (ref 6–20)
CHLORIDE: 103 mmol/L (ref 101–111)
CO2: 26 mmol/L (ref 22–32)
CREATININE: 0.79 mg/dL (ref 0.44–1.00)
Calcium: 9.6 mg/dL (ref 8.9–10.3)
Glucose, Bld: 101 mg/dL — ABNORMAL HIGH (ref 65–99)
POTASSIUM: 3.7 mmol/L (ref 3.5–5.1)
Sodium: 137 mmol/L (ref 135–145)
Total Bilirubin: 0.7 mg/dL (ref 0.3–1.2)
Total Protein: 7.3 g/dL (ref 6.5–8.1)

## 2017-03-15 LAB — CBC
HEMATOCRIT: 43.4 % (ref 35.0–47.0)
Hemoglobin: 14.9 g/dL (ref 12.0–16.0)
MCH: 29.7 pg (ref 26.0–34.0)
MCHC: 34.3 g/dL (ref 32.0–36.0)
MCV: 86.6 fL (ref 80.0–100.0)
PLATELETS: 223 10*3/uL (ref 150–440)
RBC: 5.01 MIL/uL (ref 3.80–5.20)
RDW: 14.1 % (ref 11.5–14.5)
WBC: 9.3 10*3/uL (ref 3.6–11.0)

## 2017-03-15 LAB — POCT PREGNANCY, URINE: Preg Test, Ur: NEGATIVE

## 2017-03-15 NOTE — ED Notes (Signed)
Pt c/o sharp abd pain that started while she was at work [employer];

## 2017-03-15 NOTE — ED Triage Notes (Signed)
Patient c/o lower abdominal pain, oliguria, headache, and decreased urinary output. Patient reports hx of multiple kidney infections.

## 2017-03-16 ENCOUNTER — Emergency Department: Payer: 59

## 2017-03-16 LAB — WET PREP, GENITAL
CLUE CELLS WET PREP: NONE SEEN
Sperm: NONE SEEN
TRICH WET PREP: NONE SEEN
Yeast Wet Prep HPF POC: NONE SEEN

## 2017-03-16 LAB — CHLAMYDIA/NGC RT PCR (ARMC ONLY)
CHLAMYDIA TR: NOT DETECTED
N gonorrhoeae: NOT DETECTED

## 2017-03-16 MED ORDER — HYDROCODONE-ACETAMINOPHEN 5-325 MG PO TABS: 1.0000 | ORAL_TABLET | ORAL | 0 refills | Status: DC | PRN

## 2017-03-16 MED ORDER — HYDROCODONE-ACETAMINOPHEN 5-325 MG PO TABS
1.0000 | ORAL_TABLET | Freq: Once | ORAL | Status: AC
  Administered 2017-03-16: 1 via ORAL
  Filled 2017-03-16: qty 1

## 2017-03-16 NOTE — ED Provider Notes (Signed)
[facility] Emergency Department Provider Note  Time seen: 12:27 AM  I have reviewed the triage vital signs and the nursing notes.   HISTORY  Chief Complaint Abdominal Pain    HPI Isabel Higgins is a 21 y.o. female with a past medical history of depression, presents to the emergency department for pelvic pain.  According to the patient earlier today she states she went to sit down and felt a sharp pain from her vagina into her lower pelvis.  Patient states throughout the day today the pain has worsened, it becomes much worse anytime she sits down on the toilet for instance.  States this happened one time previously, but it resolved on its own, today's episode was not resolving so the patient came to the emergency department for evaluation.  Patient states pain when she sits down but denies any dysuria or burning.  Does state for the past several days she has been expensing vaginal discharge, denies any bleeding, has an IUD in place.  Denies any nausea, vomiting, diarrhea.  Denies any known fever.   Past Medical History:  Diagnosis Date  . Bradycardia   . Depression   . Mental disorder   . Psoriasis     Patient Active Problem List   Diagnosis Date Noted  . Menometrorrhagia 08/07/2016  . Swelling 04/06/2016  . Chest pain 03/06/2012  . POTS (postural orthostatic tachycardia syndrome) 03/06/2012    Past Surgical History:  Procedure Laterality Date  . INNER EAR SURGERY     right  . SHOULDER SURGERY     right    Prior to Admission medications   Medication Sig Start Date End Date Taking? Authorizing Provider  levonorgestrel (MIRENA) 20 MCG/24HR IUD 1 each by Intrauterine route once.    [provider]  Prenatal Vit-Fe Fumarate-FA (MULTIVITAMIN-PRENATAL) 27-0.8 MG TABS tablet Take 1 tablet by mouth daily at 12 noon.    [provider]  sertraline (ZOLOFT) 50 MG tablet Take 1 tablet (50 mg total) by mouth daily. 02/19/17   Oswaldo ConroySchmid, Jacelyn  Y, CNM    Allergies  Allergen Reactions  . Honeysuckle Flower [Lonicera] Swelling, Rash and Other (See Comments)    Other Reaction: BREAK OUT, FACIAL SWELLING, WE  . Amoxicillin Nausea And Vomiting    Family History  Problem Relation Age of Onset  . Hypertension Father   . Diabetes Maternal Grandfather   . Heart attack Maternal Grandmother        x5  . Diabetes Paternal Grandfather     Social History Social History   Tobacco Use  . Smoking status: Never Smoker  . Smokeless tobacco: Never Used  Substance Use Topics  . Alcohol use: No    Alcohol/week: 0.0 oz  . Drug use: No    Review of Systems Constitutional: Negative for fever. Cardiovascular: Negative for chest pain. Respiratory: Negative for shortness of breath. Gastrointestinal: Positive for pelvic pain.  Negative for nausea vomiting or diarrhea Genitourinary: Negative for dysuria.  Negative hematuria.  Positive for discharge. Musculoskeletal: Negative for back pain. Neurological: Negative for headache All other ROS negative  ____________________________________________   PHYSICAL EXAM:  VITAL SIGNS: ED Triage Vitals  Enc Vitals Group     BP 03/15/17 1941 114/83     Pulse Rate 03/15/17 1941 84     Resp 03/15/17 1941 16     Temp 03/15/17 1941 97.9 F (36.6 C)     Temp Source 03/15/17 1941 Oral     SpO2 03/15/17 1941 98 %  Weight 03/15/17 1942 225 lb (102.1 kg)     Height 03/15/17 1942 5\' 7"  (1.702 m)     Head Circumference --      Peak Flow --      Pain Score 03/15/17 1945 7     Pain Loc --      Pain Edu? --      Excl. in GC? --    Constitutional: Alert and oriented. Well appearing and in no distress. Eyes: Normal exam ENT   Head: Normocephalic and atraumatic.   Mouth/Throat: Mucous membranes are moist. Cardiovascular: Normal rate, regular rhythm. No murmur Respiratory: Normal respiratory effort without tachypnea nor retractions. Breath sounds are clear Gastrointestinal: Soft, mild  suprapubic and right pelvic tenderness to palpation.  No rebound or guarding.  No distention.  No upper abdominal pain. Musculoskeletal: Nontender with normal range of motion in all extremities.  Neurologic:  Normal speech and language. No gross focal neurologic deficits Skin:  Skin is warm, dry and intact.  Psychiatric: Mood and affect are normal.   ____________________________________________   INITIAL IMPRESSION / ASSESSMENT AND PLAN / ED COURSE  Pertinent labs & imaging results that were available during my care of the patient were reviewed by me and considered in my medical decision making (see chart for details).  Patient presents to the emergency department for lower abdominal/pelvic pain since earlier today which has progressively worsened.  Mild tenderness on exam.  Patient does state discharge.  Differential would include urinary tract infection, pelvic infection, perforated IUD, other intra-abdominal pathology.  Overall the patient appears well, normal labs, mild tenderness on exam.  Urinalysis is normal.  We will perform a pelvic examination as well as an ultrasound to further evaluate.  Patient agreeable to this plan of care.  Ultrasound shows left hemorrhagic cyst, otherwise largely negative.  Hemorrhagic cyst could explain the patient's symptoms.  We will discharge with short course of pain medication.  I discussed breast-feeding precautions.  Patient agreeable to plan of care.  ____________________________________________   FINAL CLINICAL IMPRESSION(S) / ED DIAGNOSES  Pelvic pain Hemorrhagic ovarian cyst   Minna AntisPaduchowski, Jilda Kress, MD 03/16/17 0221

## 2017-03-16 NOTE — ED Notes (Signed)
Pelvic cart at bedside. 

## 2017-03-20 ENCOUNTER — Ambulatory Visit (INDEPENDENT_AMBULATORY_CARE_PROVIDER_SITE_OTHER): Payer: 59 | Admitting: Obstetrics and Gynecology

## 2017-03-20 ENCOUNTER — Encounter: Payer: Self-pay | Admitting: Obstetrics and Gynecology

## 2017-03-20 VITALS — BP 124/80 | Wt 222.0 lb

## 2017-03-20 DIAGNOSIS — N83202 Unspecified ovarian cyst, left side: Secondary | ICD-10-CM

## 2017-03-20 DIAGNOSIS — R102 Pelvic and perineal pain: Secondary | ICD-10-CM

## 2017-03-20 NOTE — Progress Notes (Signed)
Patient ID: Isabel Higgins, female   DOB: 02/01/1996, 21 y.o.   MRN: [id number]  Reason for Consult: Follow-up (ER Follow Up) and Ovarian Cyst   [facility],*  Subjective:      Chief complaint: Pelvic pain follow up  HPI:  Isabel Higgins is a 21 y.o. Female.  Patient presented today for an ER follow up. She was seen 4 days ago for acute abdominal pain. She was diagnosed with a ruptured ovarian cyst. Patient says her pain has resolved. She is feeling much better. She reports that she had similar symptoms in November as well. She has had an IUD for 7 months. She reports a family history of PCOS and endometriosis. She herself has had no prior symptoms of these illnesses.     Past Medical History:  Diagnosis Date  . Bradycardia   . Depression   . Mental disorder   . Psoriasis    Family History  Problem Relation Age of Onset  . Hypertension Father   . Diabetes Maternal Grandfather   . Heart attack Maternal Grandmother        x5  . Diabetes Paternal Grandfather    Past Surgical History:  Procedure Laterality Date  . INNER EAR SURGERY     right  . SHOULDER SURGERY     right    Short Social History:  Social History   Tobacco Use  . Smoking status: Never Smoker  . Smokeless tobacco: Never Used  Substance Use Topics  . Alcohol use: No    Alcohol/week: 0.0 oz    Allergies  Allergen Reactions  . Honeysuckle Flower [Lonicera] Swelling, Rash and Other (See Comments)    Other Reaction: BREAK OUT, FACIAL SWELLING, WE  . Amoxicillin Nausea And Vomiting    Current Outpatient Medications  Medication Sig Dispense Refill  . HYDROcodone-acetaminophen (NORCO/VICODIN) 5-325 MG tablet Take 1 tablet by mouth every 4 (four) hours as needed. 8 tablet 0  . levonorgestrel (MIRENA) 20 MCG/24HR IUD 1 each by Intrauterine route once.    . sertraline (ZOLOFT) 50 MG tablet Take 1 tablet (50 mg total) by mouth daily. 30 tablet 3  . Prenatal Vit-Fe Fumarate-FA  (MULTIVITAMIN-PRENATAL) 27-0.8 MG TABS tablet Take 1 tablet by mouth daily at 12 noon.     No current facility-administered medications for this visit.     Review of Systems  Constitutional: Negative for chills, fatigue, fever and unexpected weight change.  HENT: Negative for trouble swallowing.  Eyes: Negative for loss of vision.  Respiratory: Negative for cough, shortness of breath and wheezing.  Cardiovascular: Negative for chest pain, leg swelling, palpitations and syncope.  GI: Negative for abdominal pain, blood in stool, diarrhea, nausea and vomiting.  GU: Negative for difficulty urinating, dysuria, frequency and hematuria.  Musculoskeletal: Negative for back pain, leg pain and joint pain.  Skin: Negative for rash.  Neurological: Negative for dizziness, headaches, light-headedness, numbness and seizures.  Psychiatric: Negative for behavioral problem, confusion, depressed mood and sleep disturbance.        Objective:  Objective   Vitals:   03/20/17 0914  BP: 124/80  Weight: 222 lb (100.7 kg)   Body mass index is 34.77 kg/m.  Physical Exam  Constitutional: She is oriented to person, place, and time and well-developed, well-nourished, and in no distress.  Cardiovascular: Normal rate and regular rhythm.  Pulmonary/Chest: Breath sounds normal.  Abdominal: Soft. Bowel sounds are normal.  Neurological: She is alert and oriented to person, place, and time.  Skin: Skin is warm and dry.  Psychiatric: Mood, memory, affect and judgment normal.  Vitals reviewed.  Images, labs, and notes  From  ER visit reviewed.  Left ovarian cyst.     Assessment/Plan:     Discussed with patient that ovarian cyst likely ruptured and will resolve on its own without treatment. Patient reports that last month she had the same symptoms. Patient may be having recurrent ovarian cysts. They can be more common with an IUD. If the problem reoccurs patient will consider removing IUD and switching  birthcontrol to a Nexplanon.  Follow up as needed.      Natale Milchhristanna R Laurencia Roma MD [facility] 03/20/17 10:00 AM

## 2017-03-26 ENCOUNTER — Telehealth: Payer: Self-pay

## 2017-03-26 ENCOUNTER — Other Ambulatory Visit: Payer: Self-pay | Admitting: Obstetrics and Gynecology

## 2017-03-26 DIAGNOSIS — N83209 Unspecified ovarian cyst, unspecified side: Secondary | ICD-10-CM

## 2017-03-26 NOTE — Telephone Encounter (Signed)
Thank you :)

## 2017-03-26 NOTE — Telephone Encounter (Signed)
Pt recently diagnosed with hemorrhagic ovarian cysts. She has started having pain again and unsure if she has another cyst. The pain is not as sharp as it was before but has occurred over the past 2 days and she has experienced some bleeding. Pt unsure if she needs to be seen or wait it out. Please advise. Cb# [phone] thank you.

## 2017-03-26 NOTE — Telephone Encounter (Signed)
Called and discussed with patient. Will schedule for transvaginal US this week. No availability in office will have to be at hospital.  Patient denies severe stabbing pain, asked patient if pain worsened to go to the ER to rule out torsion. Motrin is helping pain currently which feels like a pulling pian similar to round ligament pain. Pain has been present since December 31st.

## 2017-03-26 NOTE — Progress Notes (Unsigned)
Trans

## 2017-03-27 ENCOUNTER — Ambulatory Visit
Admission: RE | Admit: 2017-03-27 | Discharge: 2017-03-27 | Disposition: A | Discharge status: Home / Self Care | Payer: 59 | Source: Ambulatory Visit | Attending: Obstetrics and Gynecology | Admitting: Obstetrics and Gynecology

## 2017-03-27 DIAGNOSIS — N83209 Unspecified ovarian cyst, unspecified side: Secondary | ICD-10-CM

## 2017-03-27 DIAGNOSIS — Z975 Presence of (intrauterine) contraceptive device: Secondary | ICD-10-CM | POA: Diagnosis not present

## 2017-03-27 DIAGNOSIS — R102 Pelvic and perineal pain: Secondary | ICD-10-CM | POA: Diagnosis not present

## 2017-03-28 NOTE — Progress Notes (Signed)
Normal US, discussed with patient. Could consider laparoscopy if patient having continued pelvic pain.

## 2017-05-29 DIAGNOSIS — L03032 Cellulitis of left toe: Secondary | ICD-10-CM | POA: Diagnosis not present

## 2017-05-29 DIAGNOSIS — L03031 Cellulitis of right toe: Secondary | ICD-10-CM | POA: Diagnosis not present

## 2017-05-29 DIAGNOSIS — M79674 Pain in right toe(s): Secondary | ICD-10-CM | POA: Diagnosis not present

## 2017-05-29 DIAGNOSIS — L02612 Cutaneous abscess of left foot: Secondary | ICD-10-CM | POA: Diagnosis not present

## 2017-06-05 ENCOUNTER — Encounter (INDEPENDENT_AMBULATORY_CARE_PROVIDER_SITE_OTHER): Payer: 59 | Admitting: Podiatry

## 2017-06-05 NOTE — Progress Notes (Signed)
This encounter was created in error - please disregard.

## 2017-06-12 DIAGNOSIS — L03032 Cellulitis of left toe: Secondary | ICD-10-CM | POA: Diagnosis not present

## 2017-06-20 ENCOUNTER — Telehealth: Payer: Self-pay

## 2017-06-20 NOTE — Telephone Encounter (Signed)
Returned call. Asked patient to go to the ER if pain occurs again over the weekend. If pain continues she can be seen next week for return GYN and pelvic US if needed. Patient voiced understanding.  Thank you,  Dr. Jerene PitchSchuman

## 2017-06-20 NOTE — Telephone Encounter (Signed)
Spoke w/pt to inquire if she went to ER. Pt states her mom is a nurse & her pain felt the same as when she had a ruptured cyst before. She still had hydrocodone on hand and she took it & was able to get relief & slept well. She seems to be feeling ok this a.m. Inquired if pt wanted to schedule an apt for f/u of this problem. Pt declined but would like a cb from Dr. Jerene PitchSchuman regarding this. [phone]#[phone]

## 2017-06-20 NOTE — Telephone Encounter (Signed)
Pt contacted after hours on call service reporting right side sharp pain. States she had "blood cysts" in the past. On call service advised pt to report to ER. Reviewed chart. No ER visit noted.

## 2017-06-20 NOTE — Telephone Encounter (Signed)
Patient is returning missed call. Please advise 

## 2017-09-10 ENCOUNTER — Ambulatory Visit (INDEPENDENT_AMBULATORY_CARE_PROVIDER_SITE_OTHER): Payer: Medicaid Other | Admitting: Obstetrics and Gynecology

## 2017-09-10 ENCOUNTER — Encounter: Payer: Self-pay | Admitting: Obstetrics and Gynecology

## 2017-09-10 VITALS — BP 122/82 | Ht 67.0 in | Wt 215.0 lb

## 2017-09-10 DIAGNOSIS — B9689 Other specified bacterial agents as the cause of diseases classified elsewhere: Secondary | ICD-10-CM | POA: Diagnosis not present

## 2017-09-10 DIAGNOSIS — N760 Acute vaginitis: Secondary | ICD-10-CM | POA: Diagnosis not present

## 2017-09-10 DIAGNOSIS — R102 Pelvic and perineal pain: Secondary | ICD-10-CM

## 2017-09-10 DIAGNOSIS — Z113 Encounter for screening for infections with a predominantly sexual mode of transmission: Secondary | ICD-10-CM | POA: Diagnosis not present

## 2017-09-10 DIAGNOSIS — Z30431 Encounter for routine checking of intrauterine contraceptive device: Secondary | ICD-10-CM

## 2017-09-10 DIAGNOSIS — R3989 Other symptoms and signs involving the genitourinary system: Secondary | ICD-10-CM

## 2017-09-10 MED ORDER — METRONIDAZOLE 500 MG PO TABS: 500.0000 mg | ORAL_TABLET | Freq: Two times a day (BID) | ORAL | 0 refills | Status: AC

## 2017-09-10 NOTE — Progress Notes (Signed)
Patient ID: Isabel Higgins, female   DOB: 1995-12-24, 24 y.o.   MRN: [id number]  Reason for Consult: Menstrual Problem and Dysmenorrhea   [facility],*  Subjective:     HPI:  Isabel Higgins is a 22 y.o. female she presents today after 3 days of intense pain and cramping which occurred on Friday Saturday and Sunday. She describes it as period cramps x10. She reports that she passed a red mucus clot on Sunday. She was worried she may be pregnant (because she had been having cravings) and that the clot was a miscarriage. So, she took a pregnancy Monday after passing the clot/mucus tissue and the pregnancy test was negative.    Past Medical History:  Diagnosis Date  . Bradycardia   . Depression   . Mental disorder   . Psoriasis    Family History  Problem Relation Age of Onset  . Hypertension Father   . Diabetes Maternal Grandfather   . Heart attack Maternal Grandmother        x5  . Diabetes Paternal Grandfather    Past Surgical History:  Procedure Laterality Date  . INNER EAR SURGERY     right  . SHOULDER SURGERY     right    Short Social History:  Social History   Tobacco Use  . Smoking status: Never Smoker  . Smokeless tobacco: Never Used  Substance Use Topics  . Alcohol use: No    Alcohol/week: 0.0 oz    Allergies  Allergen Reactions  . Honeysuckle Flower [Lonicera] Swelling, Rash and Other (See Comments)    Other Reaction: BREAK OUT, FACIAL SWELLING, WE  . Amoxicillin Nausea And Vomiting    Current Outpatient Medications  Medication Sig Dispense Refill  . HYDROcodone-acetaminophen (NORCO/VICODIN) 5-325 MG tablet Take 1 tablet by mouth every 4 (four) hours as needed. 8 tablet 0  . levonorgestrel (MIRENA) 20 MCG/24HR IUD 1 each by Intrauterine route once.    . sertraline (ZOLOFT) 50 MG tablet Take 1 tablet (50 mg total) by mouth daily. 30 tablet 3  . Prenatal Vit-Fe Fumarate-FA (MULTIVITAMIN-PRENATAL) 27-0.8 MG TABS tablet Take 1  tablet by mouth daily at 12 noon.     No current facility-administered medications for this visit.     Review of Systems  Constitutional: Negative for chills, fatigue, fever and unexpected weight change.  HENT: Negative for trouble swallowing.  Eyes: Negative for loss of vision.  Respiratory: Negative for cough, shortness of breath and wheezing.  Cardiovascular: Negative for chest pain, leg swelling, palpitations and syncope.  GI: Negative for abdominal pain, blood in stool, diarrhea, nausea and vomiting.  GU: Negative for difficulty urinating, dysuria, frequency and hematuria.  Musculoskeletal: Negative for back pain, leg pain and joint pain.  Skin: Negative for rash.  Neurological: Negative for dizziness, headaches, light-headedness, numbness and seizures.  Psychiatric: Negative for behavioral problem, confusion, depressed mood and sleep disturbance.        Objective:  Objective   Vitals:   09/10/17 1541  BP: 122/82  Weight: 215 lb (97.5 kg)  Height: 5\' 7"  (1.702 m)   Body mass index is 33.67 kg/m.  Physical Exam  Constitutional: She is oriented to person, place, and time. She appears well-developed and well-nourished.  HENT:  Head: Normocephalic and atraumatic.  Eyes: EOM are normal.  Cardiovascular: Normal rate, regular rhythm and normal heart sounds.  Pulmonary/Chest: Effort normal and breath sounds normal.  Abdominal: Soft. She exhibits no distension and no mass. There is  no tenderness. There is no guarding.  Genitourinary: Uterus normal. There is no rash, tenderness or lesion on the right labia. There is no rash, tenderness or lesion on the left labia. Cervix exhibits discharge and friability. Cervix exhibits no motion tenderness. Right adnexum displays tenderness. Right adnexum displays no mass and no fullness. Left adnexum displays no mass, no tenderness and no fullness. Vaginal discharge found.  Genitourinary Comments: Thin watery discharge present on exam. Friable  cervix appearance, IUD strings present on exam.   Neurological: She is alert and oriented to person, place, and time.  Skin: Skin is warm and dry.  Psychiatric: She has a normal mood and affect. Her behavior is normal. Judgment and thought content normal.  Nursing note and vitals reviewed.        Assessment/Plan:    22 yo with vaginal bleeding/discharge 1. Nuswab for vaginitis and STD screening 2. Will start patient with treatment for infection with flagyl twice a day for 7 days.   Adelene Idler MD [facility], [facility] 09/10/17 4:23 PM

## 2017-09-11 DIAGNOSIS — N760 Acute vaginitis: Secondary | ICD-10-CM | POA: Diagnosis not present

## 2017-09-15 LAB — NUSWAB VAGINITIS PLUS (VG+)
CANDIDA ALBICANS, NAA: POSITIVE — AB
CANDIDA GLABRATA, NAA: POSITIVE — AB
Chlamydia trachomatis, NAA: NEGATIVE
Neisseria gonorrhoeae, NAA: NEGATIVE
Trich vag by NAA: NEGATIVE

## 2017-09-16 ENCOUNTER — Other Ambulatory Visit: Payer: Self-pay | Admitting: Obstetrics and Gynecology

## 2017-09-16 DIAGNOSIS — B373 Candidiasis of vulva and vagina: Secondary | ICD-10-CM

## 2017-09-16 DIAGNOSIS — B3731 Acute candidiasis of vulva and vagina: Secondary | ICD-10-CM

## 2017-09-16 MED ORDER — FLUCONAZOLE 150 MG PO TABS: 150.0000 mg | ORAL_TABLET | ORAL | 0 refills | Status: AC

## 2017-09-16 NOTE — Progress Notes (Signed)
Discussed with patient on phone, diflucan prescription sent to pharmacy.

## 2017-10-29 DIAGNOSIS — J019 Acute sinusitis, unspecified: Secondary | ICD-10-CM | POA: Diagnosis not present

## 2017-10-29 DIAGNOSIS — H6983 Other specified disorders of Eustachian tube, bilateral: Secondary | ICD-10-CM | POA: Diagnosis not present

## 2017-12-04 DIAGNOSIS — S93601A Unspecified sprain of right foot, initial encounter: Secondary | ICD-10-CM | POA: Diagnosis not present

## 2017-12-04 DIAGNOSIS — S99921A Unspecified injury of right foot, initial encounter: Secondary | ICD-10-CM | POA: Diagnosis not present

## 2018-07-14 ENCOUNTER — Other Ambulatory Visit: Payer: Self-pay

## 2018-07-14 ENCOUNTER — Encounter: Payer: Self-pay | Admitting: Obstetrics & Gynecology

## 2018-07-14 ENCOUNTER — Ambulatory Visit (INDEPENDENT_AMBULATORY_CARE_PROVIDER_SITE_OTHER): Payer: 59 | Admitting: Obstetrics & Gynecology

## 2018-07-14 VITALS — BP 120/80 | Ht 67.0 in | Wt 226.0 lb

## 2018-07-14 DIAGNOSIS — R102 Pelvic and perineal pain unspecified side: Secondary | ICD-10-CM | POA: Insufficient documentation

## 2018-07-14 DIAGNOSIS — N939 Abnormal uterine and vaginal bleeding, unspecified: Secondary | ICD-10-CM

## 2018-07-14 DIAGNOSIS — Z3202 Encounter for pregnancy test, result negative: Secondary | ICD-10-CM | POA: Diagnosis not present

## 2018-07-14 HISTORY — DX: Pelvic and perineal pain: R10.2

## 2018-07-14 LAB — POCT URINE PREGNANCY: Preg Test, Ur: NEGATIVE

## 2018-07-14 NOTE — Progress Notes (Signed)
Dysfunctional Uterine Bleeding Patient complains of irregular menses. She had been bleeding rarely w Mirena IUD since it was placed 07/2016. She is now bleeding every recently on Thurs w clot and again Monday; quite rare for her.These bleeding or menses are lasting 1 days. She changes her pad or tampon rarely. Clots are med in size. Dysmenorrhea:mild, occurring just this week.  Pain is lower quadrants R> L without radiation and no other assoc sx's or modifiers.  Cyclic symptoms include: none. Current contraception: IUD. History of infertility: no. History of abnormal Pap smear: no.  PMHx: She  has a past medical history of Bradycardia, Depression, Mental disorder, and Psoriasis. Also,  has a past surgical history that includes Inner ear surgery and Shoulder surgery., family history includes Diabetes in her maternal grandfather and paternal grandfather; Heart attack in her maternal grandmother; Hypertension in her father.,  reports that she has never smoked. She has never used smokeless tobacco. She reports that she does not drink alcohol or use drugs.  She has a current medication list which includes the following prescription(s): levonorgestrel, hydrocodone-acetaminophen, multivitamin-prenatal, and sertraline. Also, is allergic to honeysuckle flower [lonicera] and amoxicillin.  Review of Systems  Constitutional: Negative for chills, fever and malaise/fatigue.  HENT: Negative for congestion, sinus pain and sore throat.   Eyes: Negative for blurred vision and pain.  Respiratory: Negative for cough and wheezing.   Cardiovascular: Negative for chest pain and leg swelling.  Gastrointestinal: Negative for abdominal pain, constipation, diarrhea, heartburn, nausea and vomiting.  Genitourinary: Negative for dysuria, frequency, hematuria and urgency.  Musculoskeletal: Negative for back pain, joint pain, myalgias and neck pain.  Skin: Negative for itching and rash.  Neurological: Negative for dizziness,  tremors and weakness.  Endo/Heme/Allergies: Does not bruise/bleed easily.  Psychiatric/Behavioral: Negative for depression. The patient is not nervous/anxious and does not have insomnia.     Objective: BP 120/80   Ht 5\' 7"  (1.702 m)   Wt 226 lb (102.5 kg)   BMI 35.40 kg/m  Physical Exam Constitutional:      General: She is not in acute distress.    Appearance: She is well-developed.  Genitourinary:     Pelvic exam was performed with patient supine.     Vagina, uterus and rectum normal.     No lesions in the vagina.     No vaginal bleeding.     No cervical motion tenderness, friability, lesion or polyp.     Uterus is mobile.     Uterus is not enlarged.     No uterine mass detected.    Uterus is midaxial.     No right or left adnexal mass present.     Right adnexa not tender.     Left adnexa not tender.  HENT:     Head: Normocephalic and atraumatic. No laceration.     Right Ear: Hearing normal.     Left Ear: Hearing normal.     Mouth/Throat:     Pharynx: Uvula midline.  Eyes:     Pupils: Pupils are equal, round, and reactive to light.  Neck:     Musculoskeletal: Normal range of motion and neck supple.     Thyroid: No thyromegaly.  Cardiovascular:     Rate and Rhythm: Normal rate and regular rhythm.     Heart sounds: No murmur. No friction rub. No gallop.   Pulmonary:     Effort: Pulmonary effort is normal. No respiratory distress.     Breath sounds: Normal breath sounds. No wheezing.  Abdominal:     General: Bowel sounds are normal. There is no distension.     Palpations: Abdomen is soft.     Tenderness: There is no abdominal tenderness. There is no rebound.  Musculoskeletal: Normal range of motion.  Neurological:     Mental Status: She is alert and oriented to person, place, and time.     Cranial Nerves: No cranial nerve deficit.  Skin:    General: Skin is warm and dry.  Psychiatric:        Judgment: Judgment normal.  Vitals signs reviewed.     ASSESSMENT/PLAN:   Problem List Items Addressed This Visit      Genitourinary   Abnormal uterine bleeding     Other   Pelvic pain - Primary    Will monitor for now, IUD seems proper location and no other signs of cyst or mass Consider US as next evaluation option Plan Annual w PAP in 2 mos Take NSAIDs (OTC) for crampy  abdominal pains  Annamarie MajorPaul , MD, Merlinda FrederickFACOG [facility], [facility] 07/14/2018  3:25 PM

## 2018-09-15 ENCOUNTER — Encounter: Payer: Self-pay | Admitting: Obstetrics & Gynecology

## 2018-09-16 ENCOUNTER — Other Ambulatory Visit: Payer: Self-pay | Admitting: Obstetrics & Gynecology

## 2018-09-16 DIAGNOSIS — R102 Pelvic and perineal pain: Secondary | ICD-10-CM

## 2018-09-16 NOTE — Telephone Encounter (Signed)
Patient is schedule 09/21/18

## 2018-09-16 NOTE — Telephone Encounter (Signed)
Have patient have GYN [place] prior to Mondays appt (this week or Monday itself)

## 2018-09-21 ENCOUNTER — Other Ambulatory Visit (HOSPITAL_COMMUNITY)
Admission: RE | Admit: 2018-09-21 | Discharge: 2018-09-21 | Disposition: A | Discharge status: Home / Self Care | Payer: 59 | Source: Ambulatory Visit | Attending: Obstetrics & Gynecology | Admitting: Obstetrics & Gynecology

## 2018-09-21 ENCOUNTER — Other Ambulatory Visit: Payer: Self-pay

## 2018-09-21 ENCOUNTER — Ambulatory Visit (INDEPENDENT_AMBULATORY_CARE_PROVIDER_SITE_OTHER): Payer: 59

## 2018-09-21 ENCOUNTER — Encounter: Payer: Self-pay | Admitting: Obstetrics & Gynecology

## 2018-09-21 ENCOUNTER — Ambulatory Visit (INDEPENDENT_AMBULATORY_CARE_PROVIDER_SITE_OTHER): Payer: 59 | Admitting: Obstetrics & Gynecology

## 2018-09-21 VITALS — BP 118/66 | HR 110 | Ht 67.0 in | Wt 228.0 lb

## 2018-09-21 DIAGNOSIS — Z124 Encounter for screening for malignant neoplasm of cervix: Secondary | ICD-10-CM

## 2018-09-21 DIAGNOSIS — R350 Frequency of micturition: Secondary | ICD-10-CM

## 2018-09-21 DIAGNOSIS — Z01419 Encounter for gynecological examination (general) (routine) without abnormal findings: Secondary | ICD-10-CM | POA: Diagnosis not present

## 2018-09-21 DIAGNOSIS — R102 Pelvic and perineal pain: Secondary | ICD-10-CM | POA: Diagnosis not present

## 2018-09-21 LAB — POCT URINALYSIS DIPSTICK
Bilirubin, UA: NEGATIVE
Blood, UA: NEGATIVE
Glucose, UA: NEGATIVE
Ketones, UA: NEGATIVE
Leukocytes, UA: NEGATIVE
Nitrite, UA: NEGATIVE
Protein, UA: NEGATIVE
Spec Grav, UA: 1.01 (ref 1.010–1.025)
Urobilinogen, UA: NEGATIVE E.U./dL — AB
pH, UA: 6 (ref 5.0–8.0)

## 2018-09-21 NOTE — Patient Instructions (Signed)
Endometriosis  Endometriosis is a condition in which the tissue that lines the uterus (endometrium) grows outside of its normal location. The tissue may grow in many locations close to the uterus, but it commonly grows on the ovaries, fallopian tubes, vagina, or bowel. When the uterus sheds the endometrium every menstrual cycle, there is bleeding wherever the endometrial tissue is located. This can cause pain because blood is irritating to tissues that are not normally exposed to it. What are the causes? The cause of endometriosis is not known. What increases the risk? You may be more likely to develop endometriosis if you:  Have a family history of endometriosis.  Have never given birth.  Started your period at age 10 or younger.  Have high levels of estrogen in your body.  Were exposed to a certain medicine (diethylstilbestrol) before you were born (in utero).  Had low birth weight.  Were born as a twin, triplet, or other multiple.  Have a BMI of less than 25. BMI is an estimate of body fat and is calculated from height and weight. What are the signs or symptoms? Often, there are no symptoms of this condition. If you do have symptoms, they may:  Vary depending on where your endometrial tissue is growing.  Occur during your menstrual period (most common) or midcycle.  Come and go, or you may go months with no symptoms at all.  Stop with menopause. Symptoms may include:  Pain in the back or abdomen.  Heavier bleeding during periods.  Pain during sex.  Painful bowel movements.  Infertility.  Pelvic pain.  Bleeding more than once a month. How is this diagnosed? This condition is diagnosed based on your symptoms and a physical exam. You may have tests, such as:  Blood tests and urine tests. These may be done to help rule out other possible causes of your symptoms.  Ultrasound, to look for abnormal tissues.  An X-ray of the lower bowel (barium enema).  An  ultrasound that is done through the vagina (transvaginally).  CT scan.  MRI.  Laparoscopy. In this procedure, a lighted, pencil-sized instrument called a laparoscope is inserted into your abdomen through an incision. The laparoscope allows your health care provider to look at the organs inside your body and check for abnormal tissue to confirm the diagnosis. If abnormal tissue is found, your health care provider may remove a small piece of tissue (biopsy) to be examined under a microscope. How is this treated? Treatment for this condition may include:  Medicines to relieve pain, such as NSAIDs.  Hormone therapy. This involves using artificial (synthetic) hormones to reduce endometrial tissue growth. Your health care provider may recommend using a hormonal form of birth control, or other medicines.  Surgery. This may be done to remove abnormal endometrial tissue. ? In some cases, tissue may be removed using a laparoscope and a laser (laparoscopic laser treatment). ? In severe cases, surgery may be done to remove the fallopian tubes, uterus, and ovaries (hysterectomy). Follow these instructions at home:  Take over-the-counter and prescription medicines only as told by your health care provider.  Do not drive or use heavy machinery while taking prescription pain medicine.  Try to avoid activities that cause pain, including sexual activity.  Keep all follow-up visits as told by your health care provider. This is important. Contact a health care provider if:  You have pain in the area between your hip bones (pelvic area) that occurs: ? Before, during, or after your period. ?   In between your period and gets worse during your period. ? During or after sex. ? With bowel movements or urination, especially during your period.  You have problems getting pregnant.  You have a fever. Get help right away if:  You have severe pain that does not get better with medicine.  You have severe  nausea and vomiting, or you cannot eat without vomiting.  You have pain that affects only the lower, right side of your abdomen.  You have abdominal pain that gets worse.  You have abdominal swelling.  You have blood in your stool. This information is not intended to replace advice given to you by your health care provider. Make sure you discuss any questions you have with your health care provider. Document Released: 03/08/2000 Document Revised: 02/21/2017 Document Reviewed: 08/12/2015 Elsevier Patient Education  2020 Elsevier Inc.  

## 2018-09-21 NOTE — Progress Notes (Signed)
HPI:      [name] is a 23 y.o. G1P1001 who LMP was No LMP recorded. (Menstrual status: IUD)., she presents today for her annual examination. The patient has no complaints today other than dysmenorrhea and irreg light periods at times.  Also recent 2 weeks of urinary freq and urgency and incomplete emptying.  The patient is sexually active. Her last pap: approximate date 2019 and was normal. The patient does perform self breast exams.  There is no notable family history of breast or ovarian cancer in her family.  The patient has regular exercise: yes.  The patient denies current symptoms of depression.    GYN History: Contraception: IUD  PMHx: Past Medical History:  Diagnosis Date   Bradycardia    Depression    Mental disorder    Psoriasis    Past Surgical History:  Procedure Laterality Date   INNER EAR SURGERY     right   SHOULDER SURGERY     right   Family History  Problem Relation Age of Onset   Hypertension Father    Diabetes Maternal Grandfather    Heart attack Maternal Grandmother        x5   Diabetes Paternal Grandfather    Social History   Tobacco Use   Smoking status: Never Smoker   Smokeless tobacco: Never Used  Substance Use Topics   Alcohol use: No    Alcohol/week: 0.0 standard drinks   Drug use: No    Current Outpatient Medications:    HYDROcodone-acetaminophen (NORCO/VICODIN) 5-325 MG tablet, Take 1 tablet by mouth every 4 (four) hours as needed. (Patient not taking: Reported on 07/14/2018), Disp: 8 tablet, Rfl: 0   levonorgestrel (MIRENA) 20 MCG/24HR IUD, 1 each by Intrauterine route once., Disp: , Rfl:    Prenatal Vit-Fe Fumarate-FA (MULTIVITAMIN-PRENATAL) 27-0.8 MG TABS tablet, Take 1 tablet by mouth daily at 12 noon., Disp: , Rfl:    sertraline (ZOLOFT) 50 MG tablet, Take 1 tablet (50 mg total) by mouth daily. (Patient not taking: Reported on 07/14/2018), Disp: 30 tablet, Rfl: 3 Allergies: Honeysuckle flower [lonicera] and  Amoxicillin  Review of Systems  Constitutional: Negative for chills, fever and malaise/fatigue.  HENT: Negative for congestion, sinus pain and sore throat.   Eyes: Negative for blurred vision and pain.  Respiratory: Negative for cough and wheezing.   Cardiovascular: Negative for chest pain and leg swelling.  Gastrointestinal: Negative for abdominal pain, constipation, diarrhea, heartburn, nausea and vomiting.  Genitourinary: Positive for frequency and urgency. Negative for dysuria and hematuria.  Musculoskeletal: Negative for back pain, joint pain, myalgias and neck pain.  Skin: Negative for itching and rash.  Neurological: Negative for dizziness, tremors and weakness.  Endo/Heme/Allergies: Does not bruise/bleed easily.  Psychiatric/Behavioral: Negative for depression. The patient is not nervous/anxious and does not have insomnia.     Objective: BP 118/66 (BP Location: Left Arm)    Pulse (!) 110    Ht 5\' 7"  (1.702 m)    Wt 228 lb (103.4 kg)    BMI 35.71 kg/m   Filed Weights   09/21/18 1401  Weight: 228 lb (103.4 kg)   Body mass index is 35.71 kg/m. Physical Exam Constitutional:      General: She is not in acute distress.    Appearance: She is well-developed.  Genitourinary:     Pelvic exam was performed with patient supine.     Vagina, uterus and rectum normal.     No lesions in the vagina.  No vaginal bleeding.     No cervical motion tenderness, friability, lesion or polyp.     IUD strings visualized.     Uterus is mobile.     Uterus is not enlarged.     No uterine mass detected.    Uterus is midaxial.     No right or left adnexal mass present.     Right adnexa not tender.     Left adnexa not tender.  HENT:     Head: Normocephalic and atraumatic. No laceration.     Right Ear: Hearing normal.     Left Ear: Hearing normal.     Mouth/Throat:     Pharynx: Uvula midline.  Eyes:     Pupils: Pupils are equal, round, and reactive to light.  Neck:     Musculoskeletal:  Normal range of motion and neck supple.     Thyroid: No thyromegaly.  Cardiovascular:     Rate and Rhythm: Normal rate and regular rhythm.     Heart sounds: No murmur. No friction rub. No gallop.   Pulmonary:     Effort: Pulmonary effort is normal. No respiratory distress.     Breath sounds: Normal breath sounds. No wheezing.  Chest:     Breasts:        Right: No mass, skin change or tenderness.        Left: No mass, skin change or tenderness.  Abdominal:     General: Bowel sounds are normal. There is no distension.     Palpations: Abdomen is soft.     Tenderness: There is no abdominal tenderness. There is no rebound.  Musculoskeletal: Normal range of motion.  Neurological:     Mental Status: She is alert and oriented to person, place, and time.     Cranial Nerves: No cranial nerve deficit.  Skin:    General: Skin is warm and dry.  Psychiatric:        Judgment: Judgment normal.  Vitals signs reviewed.   UA NEG  Assessment:  ANNUAL EXAM 1. Women's annual routine gynecological examination   2. Screening for cervical cancer   IU D check Urinary sx's Dysmenorrhea with irreg periods    Possibility of endometriosis discussed   Screening Plan:            1.  Cervical Screening-  Pap smear done today  2. Breast screening- Exam annually and mammogram>40 planned   3. Labs managed by PCP  4. Counseling for contraception: IUD   5. Review of ULTRASOUND.    I have personally reviewed images and report of recent ultrasound done at [facility].    Plan of management to be discussed with patient.  6. Consider Dx Lap and then Lupron for endometriosis if sx's persist.  Can consider overlap w pill for 1-2 mos also to see if helps w sx's.  Nothing for now. Does not desire Nexplanon or Depo.    F/U  Return in about 1 year (around 09/21/2019) for Annual.  Barnett Applebaum, MD, [facility], [facility] 09/21/2018  3:03 PM

## 2018-09-23 LAB — CYTOLOGY - PAP
Chlamydia: NEGATIVE
Diagnosis: NEGATIVE
Neisseria Gonorrhea: NEGATIVE

## 2018-09-30 ENCOUNTER — Encounter: Payer: Self-pay | Admitting: Obstetrics & Gynecology

## 2018-10-01 ENCOUNTER — Telehealth: Payer: Self-pay | Admitting: Obstetrics & Gynecology

## 2018-10-01 NOTE — Telephone Encounter (Signed)
Patient is aware of H&P at [facility] on 11/13/18 @ 8:00am w/ Dr. Kenton Kingfisher, Pre-admit Testing and COVID testing to be scheduled, and OR on 11/19/18. Patient is aware she will be asked to quarantine after COVID testing. Patient is aware she may receive calls from the [facility]. Patient confirmed [employer], and secondary Medicaid FPW. Patient is aware Medicaid FPW will not pay.

## 2018-10-01 NOTE — Telephone Encounter (Signed)
Surgery Booking Request Patient Full Name:  Isabel Higgins  MRN: [id number]  DOB: 04-Jan-1996  Surgeon: [name], MD  Requested Surgery Date and Time: ANY THURSDAY Primary Diagnosis AND Code: Pelvic pain, Endometriosis, Dysmenorrhea Secondary Diagnosis and Code:  Surgical Procedure: Diagnostic laparoscopy L&D Notification: No Admission Status: same day surgery Length of Surgery: 45 min Special Case Needs: no H&P: yes (date) Phone Interview???: yes Interpreter: Language:  Medical Clearance: no Special Scheduling Instructions: no Acuity: P3

## 2018-10-22 ENCOUNTER — Encounter: Payer: Self-pay | Admitting: Obstetrics & Gynecology

## 2018-11-13 ENCOUNTER — Encounter
Admission: RE | Admit: 2018-11-13 | Discharge: 2018-11-13 | Disposition: A | Discharge status: Home / Self Care | Payer: 59 | Source: Ambulatory Visit | Attending: Obstetrics & Gynecology | Admitting: Obstetrics & Gynecology

## 2018-11-13 ENCOUNTER — Other Ambulatory Visit: Payer: Self-pay

## 2018-11-13 ENCOUNTER — Ambulatory Visit (INDEPENDENT_AMBULATORY_CARE_PROVIDER_SITE_OTHER): Payer: 59 | Admitting: Obstetrics & Gynecology

## 2018-11-13 ENCOUNTER — Encounter: Payer: Self-pay | Admitting: Obstetrics & Gynecology

## 2018-11-13 VITALS — BP 120/80 | Ht 67.0 in | Wt 229.0 lb

## 2018-11-13 DIAGNOSIS — Z20828 Contact with and (suspected) exposure to other viral communicable diseases: Secondary | ICD-10-CM | POA: Diagnosis not present

## 2018-11-13 DIAGNOSIS — Z01812 Encounter for preprocedural laboratory examination: Secondary | ICD-10-CM | POA: Insufficient documentation

## 2018-11-13 DIAGNOSIS — R102 Pelvic and perineal pain: Secondary | ICD-10-CM

## 2018-11-13 DIAGNOSIS — N946 Dysmenorrhea, unspecified: Secondary | ICD-10-CM | POA: Insufficient documentation

## 2018-11-13 DIAGNOSIS — N809 Endometriosis, unspecified: Secondary | ICD-10-CM | POA: Diagnosis not present

## 2018-11-13 HISTORY — DX: Gastro-esophageal reflux disease without esophagitis: K21.9

## 2018-11-13 NOTE — Patient Instructions (Signed)
PRE ADMISSION TESTING For Covid, prior to procedure Tuesday 9:00-10:00 [facility] entrance (drive up)  Results in 48-72 hours You will not receive notification if test results are negative. If positive for Covid19, your provider will notify you by phone, with additional instructions.   Diagnostic Laparoscopy, Care After This sheet gives you information about how to care for yourself after your procedure. Your health care provider may also give you more specific instructions. If you have problems or questions, contact your health care provider. What can I expect after the procedure? After the procedure, it is common to have:  Mild discomfort in the abdomen.  Sore throat. Women who have laparoscopy with pelvic examination may have mild cramping and fluid coming from the vagina for a few days after the procedure. Follow these instructions at home: Medicines  Take over-the-counter and prescription medicines only as told by your health care provider.  If you were prescribed an antibiotic medicine, take it as told by your health care provider. Do not stop taking the antibiotic even if you start to feel better. Driving  Do not drive for 24 hours if you were given a medicine to help you relax (sedative) during your procedure.  Do not drive or use heavy machinery while taking prescription pain medicine. Bathing  Do not take baths, swim, or use a hot tub until your health care provider approves. You may take showers. Incision care   Follow instructions from your health care provider about how to take care of your incisions. Make sure you: ? Wash your hands with soap and water before you change your bandage (dressing). If soap and water are not available, use hand sanitizer. ? Change your dressing as told by your health care provider. ? Leave stitches (sutures), skin glue, or adhesive strips in place. These skin closures may need to stay in place for 2 weeks or longer. If  adhesive strip edges start to loosen and curl up, you may trim the loose edges. Do not remove adhesive strips completely unless your health care provider tells you to do that.  Check your incision areas every day for signs of infection. Check for: ? Redness, swelling, or pain. ? Fluid or blood. ? Warmth. ? Pus or a bad smell. Activity  Return to your normal activities as told by your health care provider. Ask your health care provider what activities are safe for you.  Do not lift anything that is heavier than 10 lb (4.5 kg), or the limit that you are told, until your health care provider says that it is safe. General instructions  To prevent or treat constipation while you are taking prescription pain medicine, your health care provider may recommend that you: ? Drink enough fluid to keep your urine pale yellow. ? Take over-the-counter or prescription medicines. ? Eat foods that are high in fiber, such as fresh fruits and vegetables, whole grains, and beans. ? Limit foods that are high in fat and processed sugars, such as fried and sweet foods.  Do not use any products that contain nicotine or tobacco, such as cigarettes and e-cigarettes. If you need help quitting, ask your health care provider.  Keep all follow-up visits as told by your health care provider. This is important. Contact a health care provider if:  You develop shoulder pain.  You feel lightheaded or faint.  You are unable to pass gas or have a bowel movement.  You feel nauseous or you vomit.  You develop a rash.  You  have redness, swelling, or pain around any incision.  You have fluid or blood coming from any incision.  Any incision feels warm to the touch.  You have pus or a bad smell coming from any incision.  You have a fever or chills. Get help right away if:  You have severe pain.  You have vomiting that does not go away.  You have heavy bleeding from the vagina.  Any incision opens.  You  have trouble breathing.  You have chest pain. Summary  After the procedure, it is common to have mild discomfort in the abdomen and a sore throat.  Check your incision areas every day for signs of infection.  Return to your normal activities as told by your health care provider. Ask your health care provider what activities are safe for you. This information is not intended to replace advice given to you by your health care provider. Make sure you discuss any questions you have with your health care provider. Document Released: 02/20/2015 Document Revised: 02/21/2017 Document Reviewed: 09/04/2016 Elsevier Patient Education  2020 [employer].

## 2018-11-13 NOTE — H&P (View-Only) (Signed)
PRE-OPERATIVE HISTORY AND PHYSICAL EXAM  HPI:  Isabel Higgins is a 23 y.o. G1P1001 No LMP recorded. (Menstrual status: IUD).; she is being admitted for surgery related to pelvic pain.  She has had pain w menses, w sex, and has irreg periods w hte Mirena IUD.  Considering pregnancy attempt in the near future as well.  No prior h/o infertility, one child born 2+ years ago.  PMHx: Past Medical History:  Diagnosis Date  . Bradycardia   . Depression   . Mental disorder   . Psoriasis    Past Surgical History:  Procedure Laterality Date  . INNER EAR SURGERY     right  . SHOULDER SURGERY     right   Family History  Problem Relation Age of Onset  . Hypertension Father   . Diabetes Maternal Grandfather   . Heart attack Maternal Grandmother        x5  . Diabetes Paternal Grandfather    Social History   Tobacco Use  . Smoking status: Never Smoker  . Smokeless tobacco: Never Used  Substance Use Topics  . Alcohol use: No    Alcohol/week: 0.0 standard drinks  . Drug use: No    Current Outpatient Medications:  .  calcium carbonate (TUMS - DOSED IN MG ELEMENTAL CALCIUM) 500 MG chewable tablet, Chew 1 tablet by mouth 2 (two) times daily as needed for indigestion or heartburn., Disp: , Rfl:  .  levonorgestrel (MIRENA) 20 MCG/24HR IUD, 1 each by Intrauterine route once., Disp: , Rfl:  .  omeprazole (PRILOSEC) 20 MG capsule, Take 20 mg by mouth daily as needed (heartburn)., Disp: , Rfl:  .  sertraline (ZOLOFT) 50 MG tablet, Take 1 tablet (50 mg total) by mouth daily., Disp: 30 tablet, Rfl: 3 Allergies: Honeysuckle flower [lonicera] and Amoxicillin  Review of Systems  Constitutional: Negative for chills, fever and malaise/fatigue.  HENT: Negative for congestion, sinus pain and sore throat.   Eyes: Negative for blurred vision and pain.  Respiratory: Negative for cough and wheezing.   Cardiovascular: Negative for chest pain and leg swelling.  Gastrointestinal: Negative for abdominal  pain, constipation, diarrhea, heartburn, nausea and vomiting.  Genitourinary: Negative for dysuria, frequency, hematuria and urgency.  Musculoskeletal: Negative for back pain, joint pain, myalgias and neck pain.  Skin: Negative for itching and rash.  Neurological: Negative for dizziness, tremors and weakness.  Endo/Heme/Allergies: Does not bruise/bleed easily.  Psychiatric/Behavioral: Negative for depression. The patient is not nervous/anxious and does not have insomnia.     Objective: BP 120/80   Ht 5\' 7"  (1.702 m)   Wt 229 lb (103.9 kg)   BMI 35.87 kg/m   Filed Weights   11/13/18 0804  Weight: 229 lb (103.9 kg)   Physical Exam Constitutional:      General: She is not in acute distress.    Appearance: She is well-developed.  HENT:     Head: Normocephalic and atraumatic. No laceration.     Right Ear: Hearing normal.     Left Ear: Hearing normal.     Mouth/Throat:     Pharynx: Uvula midline.  Eyes:     Pupils: Pupils are equal, round, and reactive to light.  Neck:     Musculoskeletal: Normal range of motion and neck supple.     Thyroid: No thyromegaly.  Cardiovascular:     Rate and Rhythm: Normal rate and regular rhythm.     Heart sounds: No murmur. No friction rub. No gallop.  Pulmonary:     Effort: Pulmonary effort is normal. No respiratory distress.     Breath sounds: Normal breath sounds. No wheezing.  Chest:     Breasts:        Right: No mass, skin change or tenderness.        Left: No mass, skin change or tenderness.  Abdominal:     General: Bowel sounds are normal. There is no distension.     Palpations: Abdomen is soft.     Tenderness: There is no abdominal tenderness. There is no rebound.  Musculoskeletal: Normal range of motion.  Neurological:     Mental Status: She is alert and oriented to person, place, and time.     Cranial Nerves: No cranial nerve deficit.  Skin:    General: Skin is warm and dry.  Psychiatric:        Judgment: Judgment normal.   Vitals signs reviewed.     Assessment: 1. Pelvic pain   2. Dysmenorrhea   3. Endometriosis   Plan Diagnostic Laparoscopy with necessary procedures for pain management Future management of endometriosis, if found, discussed, including surgery itself and Lupron.  Pregnancy effects discussed.  I have had a careful discussion with this patient about all the options available and the risk/benefits of each. I have fully informed this patient that surgery may subject her to a variety of discomforts and risks: She understands that most patients have surgery with little difficulty, but problems can happen ranging from minor to fatal. These include nausea, vomiting, pain, bleeding, infection, poor healing, hernia, or formation of adhesions. Unexpected reactions may occur from any drug or anesthetic given. Unintended injury may occur to other pelvic or abdominal structures such as Fallopian tubes, ovaries, bladder, ureter (tube from kidney to bladder), or bowel. Nerves going from the pelvis to the legs may be injured. Any such injury may require immediate or later additional surgery to correct the problem. Excessive blood loss requiring transfusion is very unlikely but possible. Dangerous blood clots may form in the legs or lungs. Physical and sexual activity will be restricted in varying degrees for an indeterminate period of time but most often 2-6 weeks.  Finally, she understands that it is impossible to list every possible undesirable effect and that the condition for which surgery is done is not always cured or significantly improved, and in rare cases may be even worse.Ample time was given to answer all questions.  Paul Amaad Byers, MD, FACOG [facility], [facility] 11/13/2018  8:22 AM  

## 2018-11-13 NOTE — Patient Instructions (Signed)
Your procedure is scheduled on: Thurs. 8/27 Report to Day Surgery. To find out your arrival time please call [phone] between [place] on Wed. 8/26.  Remember: Instructions that are not followed completely may result in serious medical risk,  up to and including death, or upon the discretion of your surgeon and anesthesiologist your  surgery may need to be rescheduled.     _X__ 1. Do not eat food after midnight the night before your procedure.                 No gum chewing or hard candies. You may drink clear liquids up to 2 hours                 before you are scheduled to arrive for your surgery- DO not drink clear                 liquids within 2 hours of the start of your surgery.                 Clear Liquids include:  water, apple juice without pulp, clear carbohydrate                 drink such as Clearfast of Gatorade, Black Coffee or Tea (Do not add                 anything to coffee or tea).  __X__2.  On the morning of surgery brush your teeth with toothpaste and water, you                may rinse your mouth with mouthwash if you wish.  Do not swallow any toothpaste of mouthwash.     ___ 3.  No Alcohol for 24 hours before or after surgery.   ___ 4.  Do Not Smoke or use e-cigarettes For 24 Hours Prior to Your Surgery.                 Do not use any chewable tobacco products for at least 6 hours prior to                 surgery.  ____  5.  Bring all medications with you on the day of surgery if instructed.   __x__  6.  Notify your doctor if there is any change in your medical condition      (cold, fever, infections).     Do not wear jewelry, make-up, hairpins, clips or nail polish. Do not wear lotions, powders, or perfumes. You may wear deodorant. Do not shave 48 hours prior to surgery. Men may shave face and neck. Do not bring valuables to the hospital.    [facility] is not responsible for any belongings or valuables.  Contacts, dentures  or bridgework may not be worn into surgery. Leave your suitcase in the car. After surgery it may be brought to your room. For patients admitted to the hospital, discharge time is determined by your treatment team.   Patients discharged the day of surgery will not be allowed to drive home.   Please read over the following fact sheets that you were given:    _x___ Take these medicines the morning of surgery with A SIP OF WATER:    1. omeprazole (PRILOSEC) 20 MG capsule  Night before and morning of surgery  2. sertraline (ZOLOFT) 50 MG tablet  3.   4.  5.  6.  ____ Fleet Enema (as directed)  __x__ Use CHG Soap as directed  ____ Use inhalers on the day of surgery  ____ Stop metformin 2 days prior to surgery    ____ Take 1/2 of usual insulin dose the night before surgery. No insulin the morning          of surgery.   ____ Stop Coumadin/Plavix/aspirin on   __x__ Stop Anti-inflammatories ibuprofen, aleve or aspirin    May take tylenol   ____ Stop supplements until after surgery.    ____ Bring C-Pap to the hospital.

## 2018-11-13 NOTE — Progress Notes (Signed)
PRE-OPERATIVE HISTORY AND PHYSICAL EXAM  HPI:  LYNNIE Higgins is a 23 y.o. G1P1001 No LMP recorded. (Menstrual status: IUD).; she is being admitted for surgery related to pelvic pain.  She has had pain w menses, w sex, and has irreg periods w hte Mirena IUD.  Considering pregnancy attempt in the near future as well.  No prior h/o infertility, one child born 2+ years ago.  PMHx: Past Medical History:  Diagnosis Date  . Bradycardia   . Depression   . Mental disorder   . Psoriasis    Past Surgical History:  Procedure Laterality Date  . INNER EAR SURGERY     right  . SHOULDER SURGERY     right   Family History  Problem Relation Age of Onset  . Hypertension Father   . Diabetes Maternal Grandfather   . Heart attack Maternal Grandmother        x5  . Diabetes Paternal Grandfather    Social History   Tobacco Use  . Smoking status: Never Smoker  . Smokeless tobacco: Never Used  Substance Use Topics  . Alcohol use: No    Alcohol/week: 0.0 standard drinks  . Drug use: No    Current Outpatient Medications:  .  calcium carbonate (TUMS - DOSED IN MG ELEMENTAL CALCIUM) 500 MG chewable tablet, Chew 1 tablet by mouth 2 (two) times daily as needed for indigestion or heartburn., Disp: , Rfl:  .  levonorgestrel (MIRENA) 20 MCG/24HR IUD, 1 each by Intrauterine route once., Disp: , Rfl:  .  omeprazole (PRILOSEC) 20 MG capsule, Take 20 mg by mouth daily as needed (heartburn)., Disp: , Rfl:  .  sertraline (ZOLOFT) 50 MG tablet, Take 1 tablet (50 mg total) by mouth daily., Disp: 30 tablet, Rfl: 3 Allergies: Honeysuckle flower [lonicera] and Amoxicillin  Review of Systems  Constitutional: Negative for chills, fever and malaise/fatigue.  HENT: Negative for congestion, sinus pain and sore throat.   Eyes: Negative for blurred vision and pain.  Respiratory: Negative for cough and wheezing.   Cardiovascular: Negative for chest pain and leg swelling.  Gastrointestinal: Negative for abdominal  pain, constipation, diarrhea, heartburn, nausea and vomiting.  Genitourinary: Negative for dysuria, frequency, hematuria and urgency.  Musculoskeletal: Negative for back pain, joint pain, myalgias and neck pain.  Skin: Negative for itching and rash.  Neurological: Negative for dizziness, tremors and weakness.  Endo/Heme/Allergies: Does not bruise/bleed easily.  Psychiatric/Behavioral: Negative for depression. The patient is not nervous/anxious and does not have insomnia.     Objective: BP 120/80   Ht 5\' 7"  (1.702 m)   Wt 229 lb (103.9 kg)   BMI 35.87 kg/m   Filed Weights   11/13/18 0804  Weight: 229 lb (103.9 kg)   Physical Exam Constitutional:      General: She is not in acute distress.    Appearance: She is well-developed.  HENT:     Head: Normocephalic and atraumatic. No laceration.     Right Ear: Hearing normal.     Left Ear: Hearing normal.     Mouth/Throat:     Pharynx: Uvula midline.  Eyes:     Pupils: Pupils are equal, round, and reactive to light.  Neck:     Musculoskeletal: Normal range of motion and neck supple.     Thyroid: No thyromegaly.  Cardiovascular:     Rate and Rhythm: Normal rate and regular rhythm.     Heart sounds: No murmur. No friction rub. No gallop.  Pulmonary:     Effort: Pulmonary effort is normal. No respiratory distress.     Breath sounds: Normal breath sounds. No wheezing.  Chest:     Breasts:        Right: No mass, skin change or tenderness.        Left: No mass, skin change or tenderness.  Abdominal:     General: Bowel sounds are normal. There is no distension.     Palpations: Abdomen is soft.     Tenderness: There is no abdominal tenderness. There is no rebound.  Musculoskeletal: Normal range of motion.  Neurological:     Mental Status: She is alert and oriented to person, place, and time.     Cranial Nerves: No cranial nerve deficit.  Skin:    General: Skin is warm and dry.  Psychiatric:        Judgment: Judgment normal.   Vitals signs reviewed.     Assessment: 1. Pelvic pain   2. Dysmenorrhea   3. Endometriosis   Plan Diagnostic Laparoscopy with necessary procedures for pain management Future management of endometriosis, if found, discussed, including surgery itself and Lupron.  Pregnancy effects discussed.  I have had a careful discussion with this patient about all the options available and the risk/benefits of each. I have fully informed this patient that surgery may subject her to a variety of discomforts and risks: She understands that most patients have surgery with little difficulty, but problems can happen ranging from minor to fatal. These include nausea, vomiting, pain, bleeding, infection, poor healing, hernia, or formation of adhesions. Unexpected reactions may occur from any drug or anesthetic given. Unintended injury may occur to other pelvic or abdominal structures such as Fallopian tubes, ovaries, bladder, ureter (tube from kidney to bladder), or bowel. Nerves going from the pelvis to the legs may be injured. Any such injury may require immediate or later additional surgery to correct the problem. Excessive blood loss requiring transfusion is very unlikely but possible. Dangerous blood clots may form in the legs or lungs. Physical and sexual activity will be restricted in varying degrees for an indeterminate period of time but most often 2-6 weeks.  Finally, she understands that it is impossible to list every possible undesirable effect and that the condition for which surgery is done is not always cured or significantly improved, and in rare cases may be even worse.Ample time was given to answer all questions.  Annamarie MajorPaul Torian Quintero, MD, Merlinda FrederickFACOG [facility], [facility] 11/13/2018  8:22 AM

## 2018-11-16 ENCOUNTER — Other Ambulatory Visit: Payer: Self-pay

## 2018-11-16 ENCOUNTER — Other Ambulatory Visit
Admission: RE | Admit: 2018-11-16 | Discharge: 2018-11-16 | Disposition: A | Discharge status: Home / Self Care | Payer: 59 | Source: Ambulatory Visit | Attending: Obstetrics & Gynecology | Admitting: Obstetrics & Gynecology

## 2018-11-16 DIAGNOSIS — Z01812 Encounter for preprocedural laboratory examination: Secondary | ICD-10-CM | POA: Diagnosis not present

## 2018-11-16 LAB — CBC
HCT: 44.1 % (ref 36.0–46.0)
Hemoglobin: 14.9 g/dL (ref 12.0–15.0)
MCH: 30.5 pg (ref 26.0–34.0)
MCHC: 33.8 g/dL (ref 30.0–36.0)
MCV: 90.2 fL (ref 80.0–100.0)
Platelets: 219 10*3/uL (ref 150–400)
RBC: 4.89 MIL/uL (ref 3.87–5.11)
RDW: 12.6 % (ref 11.5–15.5)
WBC: 8.6 10*3/uL (ref 4.0–10.5)
nRBC: 0 % (ref 0.0–0.2)

## 2018-11-16 LAB — TYPE AND SCREEN
ABO/RH(D): A POS
Antibody Screen: NEGATIVE

## 2018-11-16 LAB — SARS CORONAVIRUS 2 (TAT 6-24 HRS): SARS Coronavirus 2: NEGATIVE

## 2018-11-19 ENCOUNTER — Other Ambulatory Visit: Payer: Self-pay

## 2018-11-19 ENCOUNTER — Ambulatory Visit: Payer: 59 | Admitting: Anesthesiology

## 2018-11-19 ENCOUNTER — Encounter
Admission: RE | Disposition: A | Discharge status: Home / Self Care | Payer: Self-pay | Source: Ambulatory Visit | Attending: Obstetrics & Gynecology

## 2018-11-19 ENCOUNTER — Ambulatory Visit
Admission: RE | Admit: 2018-11-19 | Discharge: 2018-11-19 | Disposition: A | Discharge status: Home / Self Care | Payer: 59 | Source: Ambulatory Visit | Attending: Obstetrics & Gynecology | Admitting: Obstetrics & Gynecology

## 2018-11-19 DIAGNOSIS — Z880 Allergy status to penicillin: Secondary | ICD-10-CM | POA: Diagnosis not present

## 2018-11-19 DIAGNOSIS — F329 Major depressive disorder, single episode, unspecified: Secondary | ICD-10-CM | POA: Insufficient documentation

## 2018-11-19 DIAGNOSIS — Z975 Presence of (intrauterine) contraceptive device: Secondary | ICD-10-CM | POA: Insufficient documentation

## 2018-11-19 DIAGNOSIS — N946 Dysmenorrhea, unspecified: Secondary | ICD-10-CM

## 2018-11-19 DIAGNOSIS — K219 Gastro-esophageal reflux disease without esophagitis: Secondary | ICD-10-CM | POA: Diagnosis not present

## 2018-11-19 DIAGNOSIS — Z793 Long term (current) use of hormonal contraceptives: Secondary | ICD-10-CM | POA: Insufficient documentation

## 2018-11-19 DIAGNOSIS — Z79899 Other long term (current) drug therapy: Secondary | ICD-10-CM | POA: Insufficient documentation

## 2018-11-19 DIAGNOSIS — R102 Pelvic and perineal pain: Secondary | ICD-10-CM

## 2018-11-19 DIAGNOSIS — N9489 Other specified conditions associated with female genital organs and menstrual cycle: Secondary | ICD-10-CM

## 2018-11-19 HISTORY — PX: LAPAROSCOPY: SHX197

## 2018-11-19 LAB — POCT PREGNANCY, URINE: Preg Test, Ur: NEGATIVE

## 2018-11-19 SURGERY — LAPAROSCOPY, DIAGNOSTIC
Anesthesia: General | Site: Abdomen

## 2018-11-19 MED ORDER — LIDOCAINE HCL (PF) 2 % IJ SOLN
INTRAMUSCULAR | Status: AC
  Filled 2018-11-19: qty 10

## 2018-11-19 MED ORDER — FENTANYL CITRATE (PF) 100 MCG/2ML IJ SOLN
INTRAMUSCULAR | Status: DC | PRN
  Administered 2018-11-19 (×4): 50 ug via INTRAVENOUS

## 2018-11-19 MED ORDER — DEXMEDETOMIDINE HCL IN NACL 80 MCG/20ML IV SOLN
INTRAVENOUS | Status: AC
  Filled 2018-11-19: qty 20

## 2018-11-19 MED ORDER — KETOROLAC TROMETHAMINE 30 MG/ML IJ SOLN
INTRAMUSCULAR | Status: DC | PRN
  Administered 2018-11-19: 30 mg via INTRAVENOUS

## 2018-11-19 MED ORDER — PROPOFOL 10 MG/ML IV BOLUS
INTRAVENOUS | Status: AC
  Filled 2018-11-19: qty 40

## 2018-11-19 MED ORDER — SODIUM CHLORIDE FLUSH 0.9 % IV SOLN
INTRAVENOUS | Status: AC
  Filled 2018-11-19: qty 10

## 2018-11-19 MED ORDER — LACTATED RINGERS IV SOLN: INTRAVENOUS | Status: DC

## 2018-11-19 MED ORDER — ROCURONIUM BROMIDE 100 MG/10ML IV SOLN
INTRAVENOUS | Status: DC | PRN
  Administered 2018-11-19: 50 mg via INTRAVENOUS

## 2018-11-19 MED ORDER — KETOROLAC TROMETHAMINE 30 MG/ML IJ SOLN
30.0000 mg | Freq: Four times a day (QID) | INTRAMUSCULAR | Status: DC
  Filled 2018-11-19: qty 1

## 2018-11-19 MED ORDER — LIDOCAINE HCL (CARDIAC) PF 100 MG/5ML IV SOSY
PREFILLED_SYRINGE | INTRAVENOUS | Status: DC | PRN
  Administered 2018-11-19: 100 mg via INTRAVENOUS

## 2018-11-19 MED ORDER — KETOROLAC TROMETHAMINE 30 MG/ML IJ SOLN
INTRAMUSCULAR | Status: AC
  Filled 2018-11-19: qty 1

## 2018-11-19 MED ORDER — FENTANYL CITRATE (PF) 100 MCG/2ML IJ SOLN
INTRAMUSCULAR | Status: AC
  Administered 2018-11-19: 17:00:00 25 ug via INTRAVENOUS
  Filled 2018-11-19: qty 2

## 2018-11-19 MED ORDER — DEXAMETHASONE SODIUM PHOSPHATE 10 MG/ML IJ SOLN
INTRAMUSCULAR | Status: AC
  Filled 2018-11-19: qty 1

## 2018-11-19 MED ORDER — ACETAMINOPHEN 650 MG RE SUPP
650.0000 mg | RECTAL | Status: DC | PRN
  Filled 2018-11-19: qty 1

## 2018-11-19 MED ORDER — SCOPOLAMINE 1 MG/3DAYS TD PT72
1.0000 | MEDICATED_PATCH | TRANSDERMAL | Status: DC
  Administered 2018-11-19: 14:00:00 1.5 mg via TRANSDERMAL

## 2018-11-19 MED ORDER — BUPIVACAINE HCL (PF) 0.5 % IJ SOLN
INTRAMUSCULAR | Status: AC
  Filled 2018-11-19: qty 30

## 2018-11-19 MED ORDER — SCOPOLAMINE 1 MG/3DAYS TD PT72
MEDICATED_PATCH | TRANSDERMAL | Status: AC
  Administered 2018-11-19: 1.5 mg via TRANSDERMAL
  Filled 2018-11-19: qty 1

## 2018-11-19 MED ORDER — ONDANSETRON HCL 4 MG/2ML IJ SOLN
INTRAMUSCULAR | Status: AC
  Filled 2018-11-19: qty 2

## 2018-11-19 MED ORDER — ACETAMINOPHEN 325 MG PO TABS: 650.0000 mg | ORAL_TABLET | ORAL | Status: DC | PRN

## 2018-11-19 MED ORDER — FENTANYL CITRATE (PF) 100 MCG/2ML IJ SOLN
INTRAMUSCULAR | Status: AC
  Filled 2018-11-19: qty 2

## 2018-11-19 MED ORDER — FENTANYL CITRATE (PF) 100 MCG/2ML IJ SOLN
25.0000 ug | INTRAMUSCULAR | Status: DC | PRN
  Administered 2018-11-19 (×4): 25 ug via INTRAVENOUS

## 2018-11-19 MED ORDER — PROPOFOL 10 MG/ML IV BOLUS
INTRAVENOUS | Status: DC | PRN
  Administered 2018-11-19: 200 mg via INTRAVENOUS

## 2018-11-19 MED ORDER — BUPIVACAINE HCL (PF) 0.5 % IJ SOLN
INTRAMUSCULAR | Status: DC | PRN
  Administered 2018-11-19: 8 mL

## 2018-11-19 MED ORDER — MIDAZOLAM HCL 2 MG/2ML IJ SOLN
INTRAMUSCULAR | Status: AC
  Filled 2018-11-19: qty 2

## 2018-11-19 MED ORDER — ROCURONIUM BROMIDE 50 MG/5ML IV SOLN
INTRAVENOUS | Status: AC
  Filled 2018-11-19: qty 1

## 2018-11-19 MED ORDER — OXYCODONE-ACETAMINOPHEN 5-325 MG PO TABS: 1.0000 | ORAL_TABLET | ORAL | Status: DC | PRN

## 2018-11-19 MED ORDER — MIDAZOLAM HCL 2 MG/2ML IJ SOLN
INTRAMUSCULAR | Status: DC | PRN
  Administered 2018-11-19: 2 mg via INTRAVENOUS

## 2018-11-19 MED ORDER — ONDANSETRON HCL 4 MG/2ML IJ SOLN
4.0000 mg | Freq: Once | INTRAMUSCULAR | Status: AC | PRN
  Administered 2018-11-19: 4 mg via INTRAVENOUS

## 2018-11-19 MED ORDER — ONDANSETRON HCL 4 MG/2ML IJ SOLN
INTRAMUSCULAR | Status: DC | PRN
  Administered 2018-11-19: 4 mg via INTRAVENOUS

## 2018-11-19 MED ORDER — MORPHINE SULFATE (PF) 4 MG/ML IV SOLN: 1.0000 mg | INTRAVENOUS | Status: DC | PRN

## 2018-11-19 MED ORDER — DEXAMETHASONE SODIUM PHOSPHATE 10 MG/ML IJ SOLN
INTRAMUSCULAR | Status: DC | PRN
  Administered 2018-11-19: 10 mg via INTRAVENOUS

## 2018-11-19 MED ORDER — SUGAMMADEX SODIUM 500 MG/5ML IV SOLN
INTRAVENOUS | Status: AC
  Filled 2018-11-19: qty 5

## 2018-11-19 MED ORDER — SUGAMMADEX SODIUM 500 MG/5ML IV SOLN
INTRAVENOUS | Status: DC | PRN
  Administered 2018-11-19: 450 mg via INTRAVENOUS

## 2018-11-19 MED ORDER — OXYCODONE-ACETAMINOPHEN 5-325 MG PO TABS: 1.0000 | ORAL_TABLET | ORAL | 0 refills | Status: DC | PRN

## 2018-11-19 MED ORDER — DEXMEDETOMIDINE HCL IN NACL 200 MCG/50ML IV SOLN
INTRAVENOUS | Status: DC | PRN
  Administered 2018-11-19: 8 ug via INTRAVENOUS

## 2018-11-19 MED ORDER — LACTATED RINGERS IV SOLN
INTRAVENOUS | Status: DC
  Administered 2018-11-19 (×2): via INTRAVENOUS

## 2018-11-19 SURGICAL SUPPLY — 37 items
BLADE SURG SZ11 CARB STEEL (BLADE) ×2 IMPLANT
CANISTER SUCT 1200ML W/VALVE (MISCELLANEOUS) ×2 IMPLANT
CATH ROBINSON RED A/P 16FR (CATHETERS) ×2 IMPLANT
CHLORAPREP W/TINT 26 (MISCELLANEOUS) ×2 IMPLANT
COVER WAND RF STERILE (DRAPES) ×2 IMPLANT
DERMABOND ADVANCED (GAUZE/BANDAGES/DRESSINGS) ×1
DERMABOND ADVANCED .7 DNX12 (GAUZE/BANDAGES/DRESSINGS) ×1 IMPLANT
DRSG TELFA 4X3 1S NADH ST (GAUZE/BANDAGES/DRESSINGS) IMPLANT
GLOVE BIO SURGEON STRL SZ8 (GLOVE) ×2 IMPLANT
GLOVE INDICATOR 8.0 STRL GRN (GLOVE) ×2 IMPLANT
GOWN STRL REUS W/ TWL LRG LVL3 (GOWN DISPOSABLE) ×1 IMPLANT
GOWN STRL REUS W/ TWL XL LVL3 (GOWN DISPOSABLE) ×1 IMPLANT
GOWN STRL REUS W/TWL LRG LVL3 (GOWN DISPOSABLE) ×1
GOWN STRL REUS W/TWL XL LVL3 (GOWN DISPOSABLE) ×1
IRRIGATION STRYKERFLOW (MISCELLANEOUS) IMPLANT
IRRIGATOR STRYKERFLOW (MISCELLANEOUS)
IV LACTATED RINGERS 1000ML (IV SOLUTION) IMPLANT
KIT PINK PAD W/HEAD ARE REST (MISCELLANEOUS) ×2
KIT PINK PAD W/HEAD ARM REST (MISCELLANEOUS) ×1 IMPLANT
LABEL OR SOLS (LABEL) ×2 IMPLANT
NEEDLE VERESS 14GA 120MM (NEEDLE) ×2 IMPLANT
NS IRRIG 500ML POUR BTL (IV SOLUTION) ×2 IMPLANT
PACK GYN LAPAROSCOPIC (MISCELLANEOUS) ×2 IMPLANT
PAD PREP 24X41 OB/GYN DISP (PERSONAL CARE ITEMS) ×2 IMPLANT
POUCH SPECIMEN RETRIEVAL 10MM (ENDOMECHANICALS) IMPLANT
SCISSORS METZENBAUM CVD 33 (INSTRUMENTS) ×2 IMPLANT
SET TUBE SMOKE EVAC HIGH FLOW (TUBING) ×2 IMPLANT
SHEARS HARMONIC ACE PLUS 36CM (ENDOMECHANICALS) IMPLANT
SLEEVE ENDOPATH XCEL 5M (ENDOMECHANICALS) IMPLANT
SPONGE GAUZE 2X2 8PLY STRL LF (GAUZE/BANDAGES/DRESSINGS) IMPLANT
STRAP SAFETY 5IN WIDE (MISCELLANEOUS) ×2 IMPLANT
SUT VIC AB 2-0 UR6 27 (SUTURE) IMPLANT
SUT VIC AB 4-0 PS2 18 (SUTURE) IMPLANT
SYR 10ML LL (SYRINGE) ×2 IMPLANT
TROCAR 5M 150ML BLDLS (TROCAR) ×1 IMPLANT
TROCAR ENDO BLADELESS 11MM (ENDOMECHANICALS) IMPLANT
TROCAR XCEL NON-BLD 5MMX100MML (ENDOMECHANICALS) ×3 IMPLANT

## 2018-11-19 NOTE — Anesthesia Procedure Notes (Signed)
Procedure Name: Intubation Date/Time: 11/19/2018 4:19 PM Performed by: Lavone Orn, CRNA Pre-anesthesia Checklist: Patient identified, Emergency Drugs available, Suction available, Patient being monitored and Timeout performed Patient Re-evaluated:Patient Re-evaluated prior to induction Oxygen Delivery Method: Circle system utilized Preoxygenation: Pre-oxygenation with 100% oxygen Induction Type: IV induction Ventilation: Mask ventilation without difficulty Laryngoscope Size: Mac and 4 Grade View: Grade I Tube size: 7.0 mm Number of attempts: 1 Airway Equipment and Method: Stylet Placement Confirmation: ETT inserted through vocal cords under direct vision,  positive ETCO2 and breath sounds checked- equal and bilateral Secured at: 23 cm Tube secured with: Tape Dental Injury: Teeth and Oropharynx as per pre-operative assessment

## 2018-11-19 NOTE — Op Note (Signed)
  Operative Note   11/19/2018  PRE-OP DIAGNOSIS: Pelvic pain, Dysmenorrhea, Endometriosis    POST-OP DIAGNOSIS: same   PROCEDURE: Procedure(s): LAPAROSCOPY DIAGNOSTIC AND BIOPSY   SURGEON: Barnett Applebaum, MD, FACOG  ANESTHESIA: General   ESTIMATED BLOOD LOSS: Min  COMPLICATIONS: none3  DISPOSITION: PACU - hemodynamically stable.  CONDITION: stable  FINDINGS: Laparoscopic survey of the abdomen revealed a grossly normal uterus, tubes, ovaries, liver edge, gallbladder edge and appendix, No intra-abdominal adhesions were noted. One area in posterior cul de sac c/w endometriosis (biopsy done here)  PROCEDURE IN DETAIL: The patient was taken to the OR where anesthesia was administed. The patient was positioned in dorsal lithotomy in the [place]. The patient was then examined under anesthesia with the above noted findings. The patient was prepped and draped in the normal sterile fashion and foley catheter was placed. A Graves speculum was placed in the vagina and the anterior lip of the cervix was grasped with a single toothed tenaculum. A Hulka uterine manipulator was then inserted in the uterus. Uterine mobility was found to be satisfactory. The speculum was then removed.  Attention was turned to the patient's abdomen where a 5 mm skin incision was made in the umbilical fold, after injection of local anesthesia. The Veress step needle was carefully introduced into the peritoneal cavity with placement confirmed using the hanging drop technique.  Pneumoperitoneum was obtained. The 5 mm port was then placed under direct visualization with the operative laparoscope.  Trendelenburg positioning.  Additional 5 mm trocar was then placed in the RLQ lateral to the inferior epigastric blood vessels under direct visualization with the laparoscope.  Instrumentation to visualize complete pelvic anatomy performed.    A biopsy of the posterior cul de sac peritoneum is performed in the area of  concern.  Instruments and trocars removed, gas expelled, and skin closed with skin adhesive glue.  Instrument, needle, and sponge counts correct x2 at the conclusion of the case.  Pt goes to recovery room in stable condition.  Barnett Applebaum, MD, [facility], [facility] 11/19/2018  4:54 PM

## 2018-11-19 NOTE — Discharge Instructions (Signed)
AMBULATORY SURGERY  DISCHARGE INSTRUCTIONS   1) The drugs that you were given will stay in your system until tomorrow so for the next 24 hours you should not:  A) Drive an automobile B) Make any legal decisions C) Drink any alcoholic beverage   2) You may resume regular meals tomorrow.  Today it is better to start with liquids and gradually work up to solid foods.  You may eat anything you prefer, but it is better to start with liquids, then soup and crackers, and gradually work up to solid foods.   3) Please notify your doctor immediately if you have any unusual bleeding, trouble breathing, redness and pain at the surgery site, drainage, fever, or pain not relieved by medication. 4)   5) Your post-operative visit with Dr.                                     is: Date:                        Time:    Please call to schedule your post-operative visit.  6) Additional Instructions:      Diagnostic Laparoscopy, Care After This sheet gives you information about how to care for yourself after your procedure. Your health care provider may also give you more specific instructions. If you have problems or questions, contact your health care provider. What can I expect after the procedure? After the procedure, it is common to have:  Mild discomfort in the abdomen.  Sore throat. Women who have laparoscopy with pelvic examination may have mild cramping and fluid coming from the vagina for a few days after the procedure. Follow these instructions at home: Medicines  Take over-the-counter and prescription medicines only as told by your health care provider.  If you were prescribed an antibiotic medicine, take it as told by your health care provider. Do not stop taking the antibiotic even if you start to feel better. Driving  Do not drive for 24 hours if you were given a medicine to help you relax (sedative) during your procedure.  Do not drive or use heavy machinery while taking  prescription pain medicine. Bathing  Do not take baths, swim, or use a hot tub until your health care provider approves. You may take showers. Incision care   Follow instructions from your health care provider about how to take care of your incisions. Make sure you: ? Wash your hands with soap and water before you change your bandage (dressing). If soap and water are not available, use hand sanitizer. ? Change your dressing as told by your health care provider. ? Leave stitches (sutures), skin glue, or adhesive strips in place. These skin closures may need to stay in place for 2 weeks or longer. If adhesive strip edges start to loosen and curl up, you may trim the loose edges. Do not remove adhesive strips completely unless your health care provider tells you to do that.  Check your incision areas every day for signs of infection. Check for: ? Redness, swelling, or pain. ? Fluid or blood. ? Warmth. ? Pus or a bad smell. Activity  Return to your normal activities as told by your health care provider. Ask your health care provider what activities are safe for you.  Do not lift anything that is heavier than 10 lb (4.5 kg), or the limit that you  are told, until your health care provider says that it is safe. General instructions  To prevent or treat constipation while you are taking prescription pain medicine, your health care provider may recommend that you: ? Drink enough fluid to keep your urine pale yellow. ? Take over-the-counter or prescription medicines. ? Eat foods that are high in fiber, such as fresh fruits and vegetables, whole grains, and beans. ? Limit foods that are high in fat and processed sugars, such as fried and sweet foods.  Do not use any products that contain nicotine or tobacco, such as cigarettes and e-cigarettes. If you need help quitting, ask your health care provider.  Keep all follow-up visits as told by your health care provider. This is important. Contact a  health care provider if:  You develop shoulder pain.  You feel lightheaded or faint.  You are unable to pass gas or have a bowel movement.  You feel nauseous or you vomit.  You develop a rash.  You have redness, swelling, or pain around any incision.  You have fluid or blood coming from any incision.  Any incision feels warm to the touch.  You have pus or a bad smell coming from any incision.  You have a fever or chills. Get help right away if:  You have severe pain.  You have vomiting that does not go away.  You have heavy bleeding from the vagina.  Any incision opens.  You have trouble breathing.  You have chest pain. Summary  After the procedure, it is common to have mild discomfort in the abdomen and a sore throat.  Check your incision areas every day for signs of infection.  Return to your normal activities as told by your health care provider. Ask your health care provider what activities are safe for you. This information is not intended to replace advice given to you by your health care provider. Make sure you discuss any questions you have with your health care provider. Document Released: 02/20/2015 Document Revised: 02/21/2017 Document Reviewed: 09/04/2016 Elsevier Patient Education  2020 [employer]

## 2018-11-19 NOTE — Anesthesia Postprocedure Evaluation (Signed)
Anesthesia Post Note  Patient: Isabel Higgins  Procedure(s) Performed: LAPAROSCOPY DIAGNOSTIC AND BIOPSY (N/A Abdomen)  Patient location during evaluation: PACU Anesthesia Type: General Level of consciousness: awake and alert Pain management: pain level controlled Vital Signs Assessment: post-procedure vital signs reviewed and stable Respiratory status: spontaneous breathing, nonlabored ventilation, respiratory function stable and patient connected to nasal cannula oxygen Cardiovascular status: blood pressure returned to baseline and stable Postop Assessment: no apparent nausea or vomiting Anesthetic complications: no     Last Vitals:  Vitals:   11/19/18 1804 11/19/18 1822  BP: 120/72 116/75  Pulse: 66 92  Resp: 16   Temp: 36.5 C   SpO2: 96% 97%    Last Pain:  Vitals:   11/19/18 1822  TempSrc:   PainSc: 2                  Precious Haws Joaquim Tolen

## 2018-11-19 NOTE — Interval H&P Note (Signed)
History and Physical Interval Note:  11/19/2018 3:55 PM  Isabel Higgins  has presented today for surgery, with the diagnosis of PELVIC PAIN ENDOMETRIOSIS DYSMENORRHEA.  The various methods of treatment have been discussed with the patient and family. After consideration of risks, benefits and other options for treatment, the patient has consented to  Procedure(s): LAPAROSCOPY DIAGNOSTIC (N/A) as a surgical intervention.  The patient's history has been reviewed, patient examined, no change in status, stable for surgery.  I have reviewed the patient's chart and labs.  Questions were answered to the patient's satisfaction.     [name]

## 2018-11-19 NOTE — Anesthesia Preprocedure Evaluation (Signed)
Anesthesia Evaluation  Patient identified by MRN, date of birth, ID band Patient awake    Reviewed: Allergy & Precautions, NPO status , Patient's Chart, lab work & pertinent test results  History of Anesthesia Complications Negative for: history of anesthetic complications  Airway Mallampati: I       Dental   Pulmonary neg sleep apnea, neg COPD, Not current smoker,           Cardiovascular (-) hypertension(-) Past MI and (-) CHF (-) dysrhythmias (-) Valvular Problems/Murmurs     Neuro/Psych neg Seizures Depression    GI/Hepatic Neg liver ROS, GERD  Medicated and Controlled,  Endo/Other  neg diabetes  Renal/GU negative Renal ROS     Musculoskeletal   Abdominal   Peds  Hematology   Anesthesia Other Findings   Reproductive/Obstetrics                             Anesthesia Physical Anesthesia Plan  ASA: II  Anesthesia Plan: General   Post-op Pain Management:    Induction: Intravenous  PONV Risk Score and Plan: 3 and Dexamethasone, Ondansetron and Scopolamine patch - Pre-op  Airway Management Planned: Oral ETT  Additional Equipment:   Intra-op Plan:   Post-operative Plan:   Informed Consent: I have reviewed the patients History and Physical, chart, labs and discussed the procedure including the risks, benefits and alternatives for the proposed anesthesia with the patient or authorized representative who has indicated his/her understanding and acceptance.       Plan Discussed with:   Anesthesia Plan Comments:         Anesthesia Quick Evaluation

## 2018-11-19 NOTE — Transfer of Care (Signed)
Immediate Anesthesia Transfer of Care Note  Patient: Isabel Higgins  Procedure(s) Performed: Procedure(s): LAPAROSCOPY DIAGNOSTIC AND BIOPSY (N/A)  Patient Location: PACU  Anesthesia Type:General  Level of Consciousness: sedated  Airway & Oxygen Therapy: Patient Spontanous Breathing and Patient connected to face mask oxygen  Post-op Assessment: Report given to RN and Post -op Vital signs reviewed and stable  Post vital signs: Reviewed and stable  Last Vitals:  Vitals:   11/19/18 1206 11/19/18 1701  BP: 128/81 (!) 156/99  Pulse: 90 (!) 110  Resp: 16   Temp: 37.1 C 36.5 C  SpO2: 73% 42%    Complications: No apparent anesthesia complications

## 2018-11-19 NOTE — Anesthesia Post-op Follow-up Note (Signed)
Anesthesia QCDR form completed.        

## 2018-11-20 ENCOUNTER — Encounter: Payer: Self-pay | Admitting: Obstetrics & Gynecology

## 2018-11-23 LAB — SURGICAL PATHOLOGY

## 2018-11-24 ENCOUNTER — Other Ambulatory Visit: Payer: Self-pay | Admitting: Obstetrics & Gynecology

## 2018-11-24 NOTE — Progress Notes (Signed)
Pelvic pain, s/p laparoscopy. 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 Before initiating therapy with an endocrine treatment that has considerable side effects, such as a gonadotropin releasing hormone analogue, a careful review of other possible causes of pain should be made. Will d/w pt and plan accordingly No other implants or areas of concern visualized at laparoscopy  Barnett Applebaum, MD, [facility], [facility] 11/24/2018  7:59 AM

## 2018-11-24 NOTE — Progress Notes (Signed)
Pelvic pain, s/p laparoscopy. 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 Before initiating therapy with an endocrine treatment that has considerable side effects, such as a gonadotropin releasing hormone analogue, a careful review of other possible causes of pain should be made. Will d/w pt and plan accordingly No other implants or areas of concern visualized at laparoscopy  Paul Donetta Isaza, MD, FACOG [facility], [facility] 11/24/2018  7:59 AM  

## 2018-11-25 ENCOUNTER — Ambulatory Visit (INDEPENDENT_AMBULATORY_CARE_PROVIDER_SITE_OTHER): Payer: 59 | Admitting: Obstetrics & Gynecology

## 2018-11-25 ENCOUNTER — Encounter: Payer: Self-pay | Admitting: Obstetrics & Gynecology

## 2018-11-25 ENCOUNTER — Other Ambulatory Visit: Payer: Self-pay

## 2018-11-25 VITALS — BP 138/82 | Ht 67.0 in | Wt 228.0 lb

## 2018-11-25 DIAGNOSIS — N9489 Other specified conditions associated with female genital organs and menstrual cycle: Secondary | ICD-10-CM | POA: Insufficient documentation

## 2018-11-25 DIAGNOSIS — Z30432 Encounter for removal of intrauterine contraceptive device: Secondary | ICD-10-CM | POA: Diagnosis not present

## 2018-11-25 DIAGNOSIS — R102 Pelvic and perineal pain: Secondary | ICD-10-CM

## 2018-11-25 DIAGNOSIS — N838 Other noninflammatory disorders of ovary, fallopian tube and broad ligament: Secondary | ICD-10-CM | POA: Insufficient documentation

## 2018-11-25 DIAGNOSIS — Z9889 Other specified postprocedural states: Secondary | ICD-10-CM

## 2018-11-25 NOTE — Progress Notes (Signed)
  Postoperative Follow-up Patient presents post op from laparoscopy for pelvic pain, 1 week ago. Path: DIAGNOSIS:  A. PERITONEUM, POSTERIOR CUL-DE-SAC; BIOPSY:  - ENDOSALPINGOSIS.  - BENIGN FIBROADIPOSE TISSUE.  - NEGATIVE FOR ATYPIA AND MALIGNANCY.  Subjective: Patient reports some improvement in her preop symptoms. Eating a regular diet without difficulty. The patient is not having any pain.  Activity: normal activities of daily living. Patient reports additional symptom's since surgery of None.  Objective: BP 138/82   Ht 5\' 7"  (1.702 m)   Wt 228 lb (103.4 kg)   BMI 35.71 kg/m  Physical Exam Constitutional:      General: She is not in acute distress.    Appearance: She is well-developed.  Cardiovascular:     Rate and Rhythm: Normal rate.  Pulmonary:     Effort: Pulmonary effort is normal.  Abdominal:     General: There is no distension.     Palpations: Abdomen is soft.     Tenderness: There is no abdominal tenderness.     Comments: Incision Healing Well   Musculoskeletal: Normal range of motion.  Neurological:     Mental Status: She is alert and oriented to person, place, and time.     Cranial Nerves: No cranial nerve deficit.  Skin:    General: Skin is warm and dry.     Assessment:   ICD-10-CM   1. Pelvic pain  R10.2   2. Endosalpingiosis  N83.8     s/p :  laparoscopy stable  Plan: Patient has done well after surgery with no apparent complications.  I have discussed the post-operative course to date, and the expected progress moving forward.  The patient understands what complications to be concerned about.  I will see the patient in routine follow up, or sooner if needed.    Activity plan: No restriction  Excision of implant, dx of endosalpingosis (which has a connection w endometriosis but is not exactly the same)  Review: For women with pelvic pain and endosalpingiosis alone or with concurrent endometriosis lesions, we suggest management with the standard  treatments for endometriosis, such as estrogen-progestin contraceptive treatment or progestin treatment with norethindrone or medroxyprogesterone acetate. Before initiating therapy with an endocrine treatment that has considerable side effects, such as a gonadotropin releasing hormone analogue, a careful review of other possible causes of pain should be made.    History of Present Illness:  CHANTA BAUERS is a 23 y.o. that had a Mirena IUD placed approximately 2 years ago. Since that time, she states that she is now ready for pregnancy.  Pelvic exam:  Two IUD strings present seen coming from the cervical os. EGBUS, vaginal vault and cervix: within normal limits  IUD Removal Strings of IUD identified and grasped.  IUD removed without problem.  Pt tolerated this well.  IUD noted to be intact.  Assessment: IUD Removal  Plan: IUD removed and plan for contraception is no method. Plans to try for pregnancy soon She was amenable to this plan.  Barnett Applebaum, M.D. 11/25/2018 3:02 PM

## 2018-12-22 ENCOUNTER — Telehealth: Payer: Self-pay

## 2018-12-22 NOTE — Telephone Encounter (Signed)
Pt calling; IUD removed 9/2; started what she thinks was her period on 9/4 or 5 x5d; Sat evening started spotting, had stabbing pain in ovary area; Nancy Fetter am passed a clot - just blood and mucus; neg preg test Sunday afternoon.  Trying to conceive.  [phone]  Adv not miscarriage; body is trying to adjust to not having bc; stabbing pain was probably uterus contracting to get the clot out.  To keep calendar of bleeding.  Pt reassured.

## 2019-02-06 ENCOUNTER — Encounter: Payer: Self-pay | Admitting: Obstetrics & Gynecology

## 2019-02-08 NOTE — Telephone Encounter (Signed)
Please advise 

## 2019-02-09 ENCOUNTER — Ambulatory Visit (INDEPENDENT_AMBULATORY_CARE_PROVIDER_SITE_OTHER): Payer: 59 | Admitting: Obstetrics and Gynecology

## 2019-02-09 ENCOUNTER — Other Ambulatory Visit: Payer: Self-pay

## 2019-02-09 ENCOUNTER — Encounter: Payer: Self-pay | Admitting: Obstetrics and Gynecology

## 2019-02-09 VITALS — BP 110/62 | Wt 231.0 lb

## 2019-02-09 DIAGNOSIS — O0990 Supervision of high risk pregnancy, unspecified, unspecified trimester: Secondary | ICD-10-CM | POA: Insufficient documentation

## 2019-02-09 DIAGNOSIS — O99210 Obesity complicating pregnancy, unspecified trimester: Secondary | ICD-10-CM | POA: Insufficient documentation

## 2019-02-09 DIAGNOSIS — Z3201 Encounter for pregnancy test, result positive: Secondary | ICD-10-CM

## 2019-02-09 DIAGNOSIS — O99211 Obesity complicating pregnancy, first trimester: Secondary | ICD-10-CM

## 2019-02-09 DIAGNOSIS — Z3A01 Less than 8 weeks gestation of pregnancy: Secondary | ICD-10-CM

## 2019-02-09 DIAGNOSIS — Z3480 Encounter for supervision of other normal pregnancy, unspecified trimester: Secondary | ICD-10-CM

## 2019-02-09 DIAGNOSIS — Z8759 Personal history of other complications of pregnancy, childbirth and the puerperium: Secondary | ICD-10-CM | POA: Insufficient documentation

## 2019-02-09 DIAGNOSIS — Z113 Encounter for screening for infections with a predominantly sexual mode of transmission: Secondary | ICD-10-CM

## 2019-02-09 DIAGNOSIS — Z3689 Encounter for other specified antenatal screening: Secondary | ICD-10-CM

## 2019-02-09 DIAGNOSIS — N912 Amenorrhea, unspecified: Secondary | ICD-10-CM

## 2019-02-09 DIAGNOSIS — O09291 Supervision of pregnancy with other poor reproductive or obstetric history, first trimester: Secondary | ICD-10-CM

## 2019-02-09 LAB — OB RESULTS CONSOLE VARICELLA ZOSTER ANTIBODY, IGG: Varicella: IMMUNE

## 2019-02-09 LAB — POCT URINE PREGNANCY: Preg Test, Ur: POSITIVE — AB

## 2019-02-09 LAB — OB RESULTS CONSOLE GC/CHLAMYDIA: Gonorrhea: NEGATIVE

## 2019-02-09 MED ORDER — FOLIC ACID 1 MG PO TABS: 1.0000 mg | ORAL_TABLET | Freq: Every day | ORAL | 10 refills | Status: DC

## 2019-02-09 NOTE — Progress Notes (Signed)
NOB 

## 2019-02-09 NOTE — Progress Notes (Signed)
New Obstetric Patient H&P    Chief Complaint: "Desires prenatal care"   History of Present Illness: Patient is a 23 y.o. G2P1001 Not Hispanic or Latino female, presents with amenorrhea and positive home pregnancy test. Patient's last menstrual period was 01/13/2019 (exact date). and based on her  LMP, her EDD is Estimated Date of Delivery: 10/20/19 and her EGA is [id number].  Her last pap smear was on 09/21/2018 and was normal.    She had a urine pregnancy test which was positive 4 day(s)  ago. Her last menstrual period was normal. Since her LMP she claims she has experienced some fatigue, mild breast tenderness. She denies vaginal bleeding. Her past medical history is noncontributory. Her prior pregnancies are notable for gestational hypertension with delivery at 37 weeks  Since her LMP, she admits to the use of tobacco products  no There are cats in the home in the home  yes If yes Indoor She admits close contact with children on a regular basis  yes  She has had chicken pox in the past yes She has had Tuberculosis exposures, symptoms, or previously tested positive for TB   no Current or past history of domestic violence. no  Genetic Screening/Teratology Counseling: (Includes patient, baby's father, or anyone in either family with:)   1. [facility]  no 2. Thalassemia ([place], [place], Mediterranean, or Asian background): MCV<80  no 3. Neural tube defect (meningomyelocele, spina bifida, anencephaly)  no 4. Congenital heart defect  no  5. Down syndrome  no 6. Tay-Sachs (Jewish, [place])  no 7. Canavan's Disease  no 8. Sickle cell disease or trait (African)  no  9. Hemophilia or other blood disorders  no  10. Muscular dystrophy  no  11. Cystic fibrosis  no  12. Huntington's Chorea  no  13. Mental retardation/autism  no 14. Other inherited genetic or chromosomal disorder  no 15. Maternal metabolic disorder (DM, PKU, etc)  no 16. Patient or FOB with a child with a  birth defect not listed above no  16a. Patient or FOB with a birth defect themselves no 17. Recurrent pregnancy loss, or stillbirth  no  18. Any medications since LMP other than prenatal vitamins (include vitamins, supplements, OTC meds, drugs, alcohol)  no 19. Any other genetic/environmental exposure to discuss  no  Infection History:   1. Lives with someone with TB or TB exposed  no  2. Patient or partner has history of genital herpes  no 3. Rash or viral illness since LMP  no 4. History of STI (GC, CT, HPV, syphilis, HIV)  no 5. History of recent travel :  no  Other pertinent information:  Yes - still breastfeeding 23 year old.      Review of Systems:10 point review of systems negative unless otherwise noted in HPI  Past Medical History:  Past Medical History:  Diagnosis Date  . Bradycardia   . Depression   . GERD (gastroesophageal reflux disease)   . Mental disorder   . Psoriasis     Past Surgical History:  Past Surgical History:  Procedure Laterality Date  . INNER EAR SURGERY     right  . LAPAROSCOPY N/A 11/19/2018   Procedure: LAPAROSCOPY DIAGNOSTIC AND BIOPSY;  Surgeon: Nadara MustardHarris, Robert P, MD;  Location: [facility] ORS;  Service: Gynecology;  Laterality: N/A;  . SHOULDER SURGERY     right    Gynecologic History: Patient's last menstrual period was 01/13/2019 (exact date).  Obstetric History: G2P1001  Family History:  Family History  Problem Relation Age of Onset  . Hypertension Father   . Diabetes Maternal Grandfather   . Heart attack Maternal Grandmother        x5  . Diabetes Paternal Grandfather     Social History:  Social History   Socioeconomic History  . Marital status: Married    Spouse name: Not on file  . Number of children: Not on file  . Years of education: Not on file  . Highest education level: Not on file  Occupational History  . Not on file  Social Needs  . Financial resource strain: Not on file  . Food insecurity    Worry: Not on file     Inability: Not on file  . Transportation needs    Medical: Not on file    Non-medical: Not on file  Tobacco Use  . Smoking status: Never Smoker  . Smokeless tobacco: Never Used  Substance and Sexual Activity  . Alcohol use: No    Alcohol/week: 0.0 standard drinks  . Drug use: No  . Sexual activity: Yes    Birth control/protection: None  Lifestyle  . Physical activity    Days per week: Not on file    Minutes per session: Not on file  . Stress: Not on file  Relationships  . Social Musician on phone: Not on file    Gets together: Not on file    Attends religious service: Not on file    Active member of club or organization: Not on file    Attends meetings of clubs or organizations: Not on file    Relationship status: Not on file  . Intimate partner violence    Fear of current or ex partner: Not on file    Emotionally abused: Not on file    Physically abused: Not on file    Forced sexual activity: Not on file  Other Topics Concern  . Not on file  Social History Narrative  . Not on file    Allergies:  Allergies  Allergen Reactions  . Honeysuckle Flower [Lonicera] Swelling, Rash and Other (See Comments)    Other Reaction: BREAK OUT, FACIAL SWELLING, WE  . Amoxicillin Nausea And Vomiting    Did it involve swelling of the face/tongue/throat, SOB, or low BP? No Did it involve sudden or severe rash/hives, skin peeling, or any reaction on the inside of your mouth or nose? No Did you need to seek medical attention at a hospital or doctor's office? No When did it last happen?Can tolerate but gets an upset stomach If all above answers are "NO", may proceed with cephalosporin use.     Medications: Prior to Admission medications   Medication Sig Start Date End Date Taking? Authorizing Provider  calcium carbonate (TUMS - DOSED IN MG ELEMENTAL CALCIUM) 500 MG chewable tablet Chew 1 tablet by mouth 2 (two) times daily as needed for indigestion or heartburn.    Yes [provider]  omeprazole (PRILOSEC) 20 MG capsule Take 20 mg by mouth daily as needed (heartburn).   Yes [provider]    Physical Exam Vitals: Blood pressure 110/62, weight 231 lb (104.8 kg), last menstrual period 01/13/2019, currently breastfeeding. Body mass index is 36.18 kg/m.  General: NAD HEENT: normocephalic, anicteric Pulmonary: No increased work of breathing, CTAB Cardiovascular: RRR, distal pulses 2+ Abdomen: NABS, soft, non-tender, non-distended.  Umbilicus without lesions.  No hepatomegaly, splenomegaly or masses palpable. No evidence of hernia  Genitourinary:  External: Normal external female genitalia.  Normal urethral meatus, normal  Bartholin's and Skene's glands.    Vagina: Normal vaginal mucosa, no evidence of prolapse.    Cervix: Grossly normal in appearance, no bleeding  Uterus:  Non-enlarged, mobile, normal contour.  No CMT  Adnexa: ovaries non-enlarged, no adnexal masses  Rectal: deferred Extremities: no edema, erythema, or tenderness Neurologic: Grossly intact Psychiatric: mood appropriate, affect full   Assessment: 23 y.o. G2P1001 at [redacted]w[redacted]d presenting to initiate prenatal care  Plan: 1) Avoid alcoholic beverages. 2) Patient encouraged not to smoke.  3) Discontinue the use of all non-medicinal drugs and chemicals.  4) Take prenatal vitamins daily.  5) Nutrition, food safety (fish, cheese advisories, and high nitrite foods) and exercise discussed. 6) Hospital and practice style discussed with cross coverage system.  7) Genetic Screening, such as with 1st Trimester Screening, cell free fetal DNA, AFP testing, and Ultrasound, as well as with amniocentesis and CVS as appropriate, is discussed with patient. At the conclusion of today's visit patient requested genetic testing 8) CMP and P/C ratio given history of GHTN 9) Still breast feeding - additional 1g of folic acid with PNV  Malachy Mood, MD, [place] 02/09/2019, 9:26 AM

## 2019-02-11 LAB — COMPREHENSIVE METABOLIC PANEL
ALT: 42 IU/L — ABNORMAL HIGH (ref 0–32)
AST: 30 IU/L (ref 0–40)
Albumin/Globulin Ratio: 1.6 (ref 1.2–2.2)
Albumin: 4.3 g/dL (ref 3.9–5.0)
Alkaline Phosphatase: 106 IU/L (ref 39–117)
BUN/Creatinine Ratio: 8 — ABNORMAL LOW (ref 9–23)
BUN: 7 mg/dL (ref 6–20)
Bilirubin Total: 0.9 mg/dL (ref 0.0–1.2)
CO2: 21 mmol/L (ref 20–29)
Calcium: 9.5 mg/dL (ref 8.7–10.2)
Chloride: 103 mmol/L (ref 96–106)
Creatinine, Ser: 0.85 mg/dL (ref 0.57–1.00)
GFR calc Af Amer: 110 mL/min/{1.73_m2} (ref >59)
GFR calc non Af Amer: 96 mL/min/{1.73_m2} (ref >59)
Globulin, Total: 2.7 g/dL (ref 1.5–4.5)
Glucose: 100 mg/dL — ABNORMAL HIGH (ref 65–99)
Potassium: 4 mmol/L (ref 3.5–5.2)
Sodium: 138 mmol/L (ref 134–144)
Total Protein: 7 g/dL (ref 6.0–8.5)

## 2019-02-11 LAB — RPR+RH+ABO+RUB AB+AB SCR+CB...
Antibody Screen: NEGATIVE
HIV Screen 4th Generation wRfx: NONREACTIVE
Hematocrit: 42.9 % (ref 34.0–46.6)
Hemoglobin: 14.6 g/dL (ref 11.1–15.9)
Hepatitis B Surface Ag: NEGATIVE
MCH: 30.4 pg (ref 26.6–33.0)
MCHC: 34 g/dL (ref 31.5–35.7)
MCV: 89 fL (ref 79–97)
Platelets: 250 10*3/uL (ref 150–450)
RBC: 4.8 x10E6/uL (ref 3.77–5.28)
RDW: 12.5 % (ref 11.7–15.4)
RPR Ser Ql: NONREACTIVE
Rh Factor: POSITIVE
Rubella Antibodies, IGG: 1.2 index (ref >0.99)
Varicella zoster IgG: 658 index (ref >165)
WBC: 8 10*3/uL (ref 3.4–10.8)

## 2019-02-11 LAB — PROTEIN / CREATININE RATIO, URINE
Creatinine, Urine: 181.1 mg/dL
Protein, Ur: 44.6 mg/dL
Protein/Creat Ratio: 246 mg/g creat — ABNORMAL HIGH (ref 0–200)

## 2019-02-14 LAB — GC/CHLAMYDIA PROBE AMP
Chlamydia trachomatis, NAA: NEGATIVE
Neisseria Gonorrhoeae by PCR: NEGATIVE

## 2019-02-14 LAB — URINE CULTURE

## 2019-02-23 ENCOUNTER — Encounter: Payer: 59 | Admitting: Advanced Practice Midwife

## 2019-02-24 ENCOUNTER — Other Ambulatory Visit: Payer: 59

## 2019-02-24 ENCOUNTER — Encounter: Payer: 59 | Admitting: Advanced Practice Midwife

## 2019-03-05 ENCOUNTER — Other Ambulatory Visit: Payer: Self-pay

## 2019-03-05 ENCOUNTER — Encounter: Payer: Self-pay | Admitting: Certified Nurse Midwife

## 2019-03-05 ENCOUNTER — Ambulatory Visit (INDEPENDENT_AMBULATORY_CARE_PROVIDER_SITE_OTHER): Payer: Medicaid Other | Admitting: Certified Nurse Midwife

## 2019-03-05 ENCOUNTER — Ambulatory Visit: Payer: 59

## 2019-03-05 VITALS — BP 110/80 | Temp 96.5°F | Wt 243.0 lb

## 2019-03-05 DIAGNOSIS — Z3A01 Less than 8 weeks gestation of pregnancy: Secondary | ICD-10-CM

## 2019-03-05 DIAGNOSIS — Z3480 Encounter for supervision of other normal pregnancy, unspecified trimester: Secondary | ICD-10-CM

## 2019-03-05 DIAGNOSIS — O99211 Obesity complicating pregnancy, first trimester: Secondary | ICD-10-CM

## 2019-03-05 DIAGNOSIS — N926 Irregular menstruation, unspecified: Secondary | ICD-10-CM

## 2019-03-05 DIAGNOSIS — O99210 Obesity complicating pregnancy, unspecified trimester: Secondary | ICD-10-CM

## 2019-03-05 LAB — POCT URINALYSIS DIPSTICK OB: Glucose, UA: NEGATIVE

## 2019-03-05 NOTE — Progress Notes (Signed)
23 year old G2 P1001 presents for a ROB and dating scan at [facility].: She has nausea and occasional vomiting, but has gained 12# over the last month.No vaginal bleeding. She reports not having irregular bleedi after her IUD was removed on 11/25/2018. LMP 01/13/2019+/- a few days. Hx of fetal macrosomia with first baby. BMI 38.06 kg/m2  TVUS today: SIUP with  CRL 1.61cm c/w [redacted] week EGA and changing her EDC to 10/15/2019. + FCA  Yolk sac regular in shape No masses seen in adnexal areas bilaterally  A: Viable  IUP at [redacted] weeks gestation with EDC=10/15/2019  P: RTO in 3-4 weeks ([place] of [place] 2 [place] for formal ultrasound, 1 hour GTT, MaterniT 21 and [place], CNM  .

## 2019-03-08 ENCOUNTER — Other Ambulatory Visit: Payer: Self-pay | Admitting: Obstetrics & Gynecology

## 2019-03-08 MED ORDER — ONDANSETRON 4 MG PO TBDP: 4.0000 mg | ORAL_TABLET | Freq: Four times a day (QID) | ORAL | 1 refills | Status: DC | PRN

## 2019-03-08 MED ORDER — PANTOPRAZOLE SODIUM 20 MG PO TBEC: 20.0000 mg | DELAYED_RELEASE_TABLET | Freq: Every day | ORAL | 4 refills | Status: DC | PRN

## 2019-03-08 NOTE — Telephone Encounter (Signed)
Can you send in Rx, Dr Georgianne Fick not in the office today.

## 2019-03-10 ENCOUNTER — Other Ambulatory Visit: Payer: Self-pay | Admitting: Obstetrics and Gynecology

## 2019-03-10 MED ORDER — CITRANATAL BLOOM 90-1 MG PO TABS: 1.0000 | ORAL_TABLET | Freq: Every day | ORAL | 11 refills | Status: DC

## 2019-03-12 ENCOUNTER — Telehealth: Payer: Self-pay

## 2019-03-12 NOTE — Telephone Encounter (Signed)
[facility] faxed over a request for a new Rx for prenatal. Citranatal Bloom is not available at this time. Please send in substitute

## 2019-03-17 ENCOUNTER — Other Ambulatory Visit: Payer: 59

## 2019-03-17 ENCOUNTER — Telehealth: Payer: Self-pay

## 2019-03-17 NOTE — Telephone Encounter (Signed)
Pt called after hour nurse 03/16/19 at 9:28pm; is 10w preg; hsb has had close contact c his cousin in the last 2d who has been round the uncle that just tested covid+ on 12/22; no current sxs. The after hour nurse adv pt to call PCP and discuss within the next few days; get tested 6-8 days after exposure; stay home until results received; if negative it probably means you did not have the virus at the time of testing or that sample was collected too early in infection; watch for sxs of cough and fever; measure temp twice a day until 14 days p exposure. Called pt; gave number to [facility]; adv to call them.

## 2019-03-26 NOTE — L&D Delivery Note (Addendum)
Obstetrical Delivery Note   Date of Delivery:   10/09/2019 Primary OB:   [facility] Gestational Age/EDD: [redacted]w[redacted]d (Dated by 8wk ultrasound) Antepartum complications: Obesity, pelvic pain  Delivered By:   Farrel Conners, CNM  Delivery Type:   spontaneous vaginal delivery  Procedure Details:   CTSP with urge to push. Mother pushed to deliver a viable female infant in ROA with CAN x1, reduced on perineum. Tight shoulders resolved with McRoberts. Baby placed on mother's abdomen and dried. After a delayed cord clamping, the cord was cut by the father of the baby. Baby then placed skin to skin with mother. Apgars 8&9. Spontaneous delivery of intact placenta and 3 vessel cord. Uterine atony resolved with IV Pitocin, IM Methergine, and bimanual. Small right periurethral laceration repaired with 3-0 Chromic. No vaginal or cervical lacerations seen. EBL 250 ml Anesthesia:    epidural Intrapartum complications: none GBS:    negative Laceration:    periurethral Episiotomy:    none Placenta:    Via active 3rd stage. To pathology: no Estimated Blood Loss:  250 ml Baby:    Liveborn female, Apgars 8/9, weight pending    Farrel Conners, CNM

## 2019-03-29 ENCOUNTER — Encounter: Payer: Self-pay | Admitting: Obstetrics and Gynecology

## 2019-03-29 ENCOUNTER — Other Ambulatory Visit: Payer: 59

## 2019-03-29 ENCOUNTER — Ambulatory Visit (INDEPENDENT_AMBULATORY_CARE_PROVIDER_SITE_OTHER): Payer: 59

## 2019-03-29 ENCOUNTER — Other Ambulatory Visit: Payer: Self-pay

## 2019-03-29 ENCOUNTER — Ambulatory Visit (INDEPENDENT_AMBULATORY_CARE_PROVIDER_SITE_OTHER): Payer: 59 | Admitting: Obstetrics and Gynecology

## 2019-03-29 VITALS — BP 130/80 | Wt 229.0 lb

## 2019-03-29 DIAGNOSIS — Z1379 Encounter for other screening for genetic and chromosomal anomalies: Secondary | ICD-10-CM

## 2019-03-29 DIAGNOSIS — Z3480 Encounter for supervision of other normal pregnancy, unspecified trimester: Secondary | ICD-10-CM

## 2019-03-29 DIAGNOSIS — O3481 Maternal care for other abnormalities of pelvic organs, first trimester: Secondary | ICD-10-CM

## 2019-03-29 DIAGNOSIS — Z3A11 11 weeks gestation of pregnancy: Secondary | ICD-10-CM

## 2019-03-29 DIAGNOSIS — N8311 Corpus luteum cyst of right ovary: Secondary | ICD-10-CM

## 2019-03-29 DIAGNOSIS — O09291 Supervision of pregnancy with other poor reproductive or obstetric history, first trimester: Secondary | ICD-10-CM

## 2019-03-29 DIAGNOSIS — Z6841 Body Mass Index (BMI) 40.0 and over, adult: Secondary | ICD-10-CM | POA: Insufficient documentation

## 2019-03-29 DIAGNOSIS — Z8759 Personal history of other complications of pregnancy, childbirth and the puerperium: Secondary | ICD-10-CM

## 2019-03-29 DIAGNOSIS — Z6835 Body mass index (BMI) 35.0-35.9, adult: Secondary | ICD-10-CM

## 2019-03-29 DIAGNOSIS — O219 Vomiting of pregnancy, unspecified: Secondary | ICD-10-CM

## 2019-03-29 DIAGNOSIS — O99211 Obesity complicating pregnancy, first trimester: Secondary | ICD-10-CM

## 2019-03-29 DIAGNOSIS — O99210 Obesity complicating pregnancy, unspecified trimester: Secondary | ICD-10-CM

## 2019-03-29 MED ORDER — ONDANSETRON 8 MG PO TBDP: 8.0000 mg | ORAL_TABLET | Freq: Three times a day (TID) | ORAL | 1 refills | Status: DC | PRN

## 2019-03-29 NOTE — Progress Notes (Signed)
Routine Prenatal Care Visit  Subjective  Isabel Higgins is a 24 y.o. G2P1001 at [redacted]w[redacted]d being seen today for ongoing prenatal care.  She is currently monitored for the following issues for this high-risk pregnancy and has Chest pain; POTS (postural orthostatic tachycardia syndrome); Swelling; Menometrorrhagia; Pelvic pain; Abnormal uterine bleeding; Dysmenorrhea; Endosalpingiosis; History of gestational hypertension; Maternal obesity, antepartum; Supervision of other normal pregnancy, antepartum; and BMI 35.0-35.9,adult on their problem list.  ----------------------------------------------------------------------------------- Patient reports right flank pain, still with daily nausea. Unable to keep glucose down today.    . Vag. Bleeding: None.   . Leaking Fluid denies.  ----------------------------------------------------------------------------------- The following portions of the patient's history were reviewed and updated as appropriate: allergies, current medications, past family history, past medical history, past social history, past surgical history and problem list. Problem list updated.  Objective  Blood pressure 130/80, weight 229 lb (103.9 kg), last menstrual period 01/13/2019, currently breastfeeding. Pregravid weight 231 lb (104.8 kg) Total Weight Gain -2 lb (-0.907 kg) Urinalysis: Urine Protein    Urine Glucose    Fetal Status: Fetal Heart Rate (bpm): Present ([place])         General:  Alert, oriented and cooperative. Patient is in no acute distress.  Skin: Skin is warm and dry. No rash noted.   Cardiovascular: Normal heart rate noted  Respiratory: Normal respiratory effort, no problems with respiration noted  Abdomen: Soft, gravid, appropriate for gestational age.       Pelvic:  Cervical exam deferred        Extremities: Normal range of motion.     Mental Status: Normal mood and affect. Normal behavior. Normal judgment and thought content.   Assessment   24 y.o. G2P1001 at  [redacted]w[redacted]d by  10/15/2019, by Ultrasound presenting for routine prenatal visit  Plan   Pregnancy#2 Problems (from 01/13/19 to present)    Problem Noted Resolved   BMI 35.0-35.9,adult 03/29/2019 by Conard Novak, MD No   History of gestational hypertension 02/09/2019 by Vena [place], MD No   Maternal obesity, antepartum 02/09/2019 by Vena [place], MD No   Supervision of other normal pregnancy, antepartum 02/09/2019 by Vena [place], MD No   Overview Addendum 03/05/2019 10:19 PM by Farrel Conners, CNM    Clinic [facility] Prenatal Labs  Dating 8 week [place] on 03/05/19 Blood type: A/Positive/-- (11/17 1008)   Genetic Screen 1 Screen:    AFP:     Quad:     NIPS: Antibody:Negative (11/17 1008)  Anatomic [place]  Rubella: 1.20 (11/17 1008) Varicella: Immune  GTT Early:               Third trimester:  RPR: Non Reactive (11/17 1008)   Rhogam  HBsAg: Negative (11/17 1008)   TDaP vaccine                       Flu Shot: HIV: Non Reactive (11/17 1008)   Baby Food                                GBS:   Contraception  Pap:  CBB     CS/VBAC    Support Person             Preterm labor symptoms and general obstetric precautions including but not limited to vaginal bleeding, contractions, leaking of fluid and fetal movement were reviewed in detail with the patient. Please refer to After Visit Summary for other  counseling recommendations.   - NIPT today  Return in about 1 day (around 03/30/2019) for 1 hour gtt with NIPT.  Routine prenatal in 4 weeks.  Prentice Docker, MD, [facility], [facility] 03/29/2019 4:35 PM

## 2019-04-01 ENCOUNTER — Other Ambulatory Visit: Payer: Self-pay | Admitting: Certified Nurse Midwife

## 2019-04-01 ENCOUNTER — Other Ambulatory Visit: Payer: 59

## 2019-04-01 ENCOUNTER — Other Ambulatory Visit: Payer: Self-pay

## 2019-04-01 LAB — URINE CULTURE

## 2019-04-02 LAB — GLUCOSE, 1 HOUR GESTATIONAL: Gestational Diabetes Screen: 129 mg/dL (ref 65–139)

## 2019-04-04 LAB — MATERNIT21 PLUS CORE+SCA
Fetal Fraction: 4
Monosomy X (Turner Syndrome): NOT DETECTED
Result (T21): NEGATIVE
Trisomy 13 (Patau syndrome): NEGATIVE
Trisomy 18 (Edwards syndrome): NEGATIVE
Trisomy 21 (Down syndrome): NEGATIVE
XXX (Triple X Syndrome): NOT DETECTED
XXY (Klinefelter Syndrome): NOT DETECTED
XYY (Jacobs Syndrome): NOT DETECTED

## 2019-04-12 ENCOUNTER — Other Ambulatory Visit: Payer: Self-pay

## 2019-04-12 ENCOUNTER — Encounter: Payer: Self-pay | Admitting: Emergency Medicine

## 2019-04-12 ENCOUNTER — Emergency Department
Admission: EM | Admit: 2019-04-12 | Discharge: 2019-04-12 | Disposition: A | Discharge status: Home / Self Care | Payer: 59 | Attending: Emergency Medicine | Admitting: Emergency Medicine

## 2019-04-12 DIAGNOSIS — K5900 Constipation, unspecified: Secondary | ICD-10-CM | POA: Insufficient documentation

## 2019-04-12 DIAGNOSIS — O219 Vomiting of pregnancy, unspecified: Secondary | ICD-10-CM

## 2019-04-12 DIAGNOSIS — Z3A14 14 weeks gestation of pregnancy: Secondary | ICD-10-CM | POA: Insufficient documentation

## 2019-04-12 DIAGNOSIS — R109 Unspecified abdominal pain: Secondary | ICD-10-CM | POA: Diagnosis not present

## 2019-04-12 DIAGNOSIS — Z79899 Other long term (current) drug therapy: Secondary | ICD-10-CM | POA: Diagnosis not present

## 2019-04-12 DIAGNOSIS — O210 Mild hyperemesis gravidarum: Secondary | ICD-10-CM | POA: Diagnosis not present

## 2019-04-12 LAB — BASIC METABOLIC PANEL
Anion gap: 9 (ref 5–15)
BUN: 8 mg/dL (ref 6–20)
CO2: 23 mmol/L (ref 22–32)
Calcium: 8.7 mg/dL — ABNORMAL LOW (ref 8.9–10.3)
Chloride: 103 mmol/L (ref 98–111)
Creatinine, Ser: 0.62 mg/dL (ref 0.44–1.00)
GFR calc Af Amer: >60 mL/min (ref >60)
GFR calc non Af Amer: >60 mL/min (ref >60)
Glucose, Bld: 81 mg/dL (ref 70–99)
Potassium: 3.4 mmol/L — ABNORMAL LOW (ref 3.5–5.1)
Sodium: 135 mmol/L (ref 135–145)

## 2019-04-12 LAB — CBC
HCT: 40.3 % (ref 36.0–46.0)
Hemoglobin: 13.6 g/dL (ref 12.0–15.0)
MCH: 29.6 pg (ref 26.0–34.0)
MCHC: 33.7 g/dL (ref 30.0–36.0)
MCV: 87.8 fL (ref 80.0–100.0)
Platelets: 212 10*3/uL (ref 150–400)
RBC: 4.59 MIL/uL (ref 3.87–5.11)
RDW: 13.1 % (ref 11.5–15.5)
WBC: 10.6 10*3/uL — ABNORMAL HIGH (ref 4.0–10.5)
nRBC: 0 % (ref 0.0–0.2)

## 2019-04-12 LAB — HCG, QUANTITATIVE, PREGNANCY: hCG, Beta Chain, Quant, S: 79362 m[IU]/mL — ABNORMAL HIGH (ref <5)

## 2019-04-12 MED ORDER — PROMETHAZINE HCL 25 MG/ML IJ SOLN
12.5000 mg | Freq: Once | INTRAMUSCULAR | Status: AC
  Administered 2019-04-12: 12.5 mg via INTRAVENOUS

## 2019-04-12 MED ORDER — LACTATED RINGERS IV BOLUS
1000.0000 mL | Freq: Once | INTRAVENOUS | Status: AC
  Administered 2019-04-12: 17:00:00 1000 mL via INTRAVENOUS

## 2019-04-12 MED ORDER — PROMETHAZINE HCL 12.5 MG PO TABS: 12.5000 mg | ORAL_TABLET | Freq: Four times a day (QID) | ORAL | 0 refills | Status: DC | PRN

## 2019-04-12 NOTE — ED Provider Notes (Signed)
[facility] Emergency Department Provider Note   ____________________________________________   First MD Initiated Contact with Patient 04/12/19 1546     (approximate)  I have reviewed the triage vital signs and the nursing notes.   HISTORY  Chief Complaint Emesis and Constipation    HPI Isabel Higgins is a 24 y.o. female G2 P1-0-0-1 at approximately 14 weeks of pregnancy with past medical history of POTS and GERD presents to the ED complaining of vomiting and constipation.  Patient reports that she has been feeling nauseous for the past couple weeks of her pregnancy with multiple episodes of nonbilious and nonbloody emesis over the past couple of days.  She has also had difficulty with bowel movements lately, states she last had a normal bowel movement about 3 weeks ago but has been passing small amounts of hard stool since then.  She has tried an over-the-counter stool softener without relief.  She has also been prescribed for an 8 mg of Zofran without relief of nausea.  She denies any fevers, chills, dysuria, hematuria, or flank pain.  She does describe diffuse abdominal cramping without focal pain.  She had an ultrasound earlier this pregnancy that showed an intrauterine gestation.        Past Medical History:  Diagnosis Date  . Bradycardia   . Depression   . GERD (gastroesophageal reflux disease)   . Mental disorder   . Psoriasis     Patient Active Problem List   Diagnosis Date Noted  . BMI 35.0-35.9,adult 03/29/2019  . History of gestational hypertension 02/09/2019  . Maternal obesity, antepartum 02/09/2019  . Supervision of other normal pregnancy, antepartum 02/09/2019  . Endosalpingiosis 11/25/2018  . Dysmenorrhea 11/13/2018  . Pelvic pain 07/14/2018  . Abnormal uterine bleeding 07/14/2018  . Menometrorrhagia 08/07/2016  . Swelling 04/06/2016  . Chest pain 03/06/2012  . POTS (postural orthostatic tachycardia syndrome) 03/06/2012     Past Surgical History:  Procedure Laterality Date  . INNER EAR SURGERY     right  . LAPAROSCOPY N/A 11/19/2018   Procedure: LAPAROSCOPY DIAGNOSTIC AND BIOPSY;  Surgeon: Nadara Mustard, MD;  Location: [facility] ORS;  Service: Gynecology;  Laterality: N/A;  . SHOULDER SURGERY     right    Prior to Admission medications   Medication Sig Start Date End Date Taking? Authorizing Provider  calcium carbonate (TUMS - DOSED IN MG ELEMENTAL CALCIUM) 500 MG chewable tablet Chew 1 tablet by mouth 2 (two) times daily as needed for indigestion or heartburn.    [provider]  folic acid (FOLVITE) 1 MG tablet Take 1 tablet (1 mg total) by mouth daily. 02/09/19   Vena Austria, MD  omeprazole (PRILOSEC) 20 MG capsule Take 20 mg by mouth daily as needed (heartburn).    [provider]  ondansetron (ZOFRAN ODT) 8 MG disintegrating tablet Take 1 tablet (8 mg total) by mouth every 8 (eight) hours as needed for nausea or vomiting. 03/29/19   Conard Novak, MD  pantoprazole (PROTONIX) 20 MG tablet Take 1 tablet (20 mg total) by mouth daily as needed. 03/08/19   Nadara Mustard, MD  Prenatal-DSS-FeCb-FeGl-FA (CITRANATAL BLOOM) 90-1 MG TABS Take 1 tablet by mouth daily. 03/10/19   Vena Austria, MD  promethazine (PHENERGAN) 12.5 MG tablet Take 1 tablet (12.5 mg total) by mouth every 6 (six) hours as needed for nausea or vomiting. 04/12/19   Chesley Noon, MD    Allergies Honeysuckle flower [lonicera] and Amoxicillin  Family History  Problem Relation  Age of Onset  . Hypertension Father   . Diabetes Maternal Grandfather   . Heart attack Maternal Grandmother        x5  . Diabetes Paternal Grandfather     Social History Social History   Tobacco Use  . Smoking status: Never Smoker  . Smokeless tobacco: Never Used  Substance Use Topics  . Alcohol use: No    Alcohol/week: 0.0 standard drinks  . Drug use: No    Review of Systems  Constitutional: No fever/chills Eyes:  No visual changes. ENT: No sore throat. Cardiovascular: Denies chest pain. Respiratory: Denies shortness of breath. Gastrointestinal: Positive for abdominal pain.  Positive for nausea and vomiting.  No diarrhea.  Positive for constipation. Genitourinary: Negative for dysuria. Musculoskeletal: Negative for back pain. Skin: Negative for rash. Neurological: Negative for headaches, focal weakness or numbness.  ____________________________________________   PHYSICAL EXAM:  VITAL SIGNS: ED Triage Vitals  Enc Vitals Group     BP 04/12/19 1341 136/76     Pulse Rate 04/12/19 1341 (!) 107     Resp 04/12/19 1341 20     Temp 04/12/19 1341 98.4 F (36.9 C)     Temp Source 04/12/19 1341 Oral     SpO2 04/12/19 1341 96 %     Weight 04/12/19 1339 234 lb (106.1 kg)     Height 04/12/19 1339 5\' 7"  (1.702 m)     Head Circumference --      Peak Flow --      Pain Score 04/12/19 1338 6     Pain Loc --      Pain Edu? --      Excl. in GC? --     Constitutional: Alert and oriented. Eyes: Conjunctivae are normal. Head: Atraumatic. Nose: No congestion/rhinnorhea. Mouth/Throat: Mucous membranes are moist. Neck: Normal ROM Cardiovascular: Normal rate, regular rhythm. Grossly normal heart sounds. Respiratory: Normal respiratory effort.  No retractions. Lungs CTAB. Gastrointestinal: Gravid abdomen, soft and nontender. No distention. Genitourinary: deferred Musculoskeletal: No lower extremity tenderness nor edema. Neurologic:  Normal speech and language. No gross focal neurologic deficits are appreciated. Skin:  Skin is warm, dry and intact. No rash noted. Psychiatric: Mood and affect are normal. Speech and behavior are normal.  ____________________________________________   LABS (all labs ordered are listed, but only abnormal results are displayed)  Labs Reviewed  CBC - Abnormal; Notable for the following components:      Result Value   WBC 10.6 (*)    All other components within normal  limits  HCG, QUANTITATIVE, PREGNANCY - Abnormal; Notable for the following components:   hCG, Beta Chain, Quant, S 04/14/19 (*)    All other components within normal limits  BASIC METABOLIC PANEL - Abnormal; Notable for the following components:   Potassium 3.4 (*)    Calcium 8.7 (*)    All other components within normal limits    PROCEDURES  Procedure(s) performed (including Critical Care):  Procedures   ____________________________________________   INITIAL IMPRESSION / ASSESSMENT AND PLAN / ED COURSE       24 year old female, G2 P1-0-0-1 at approximately 14 weeks of pregnancy presents to the ED with increasing nausea and vomiting as well as persistent constipation over the past 3 weeks.  She has had ultrasound earlier this month showing normal intrauterine gestation.  She has a benign abdominal exam on my evaluation and I suspect her vomiting is secondary to her pregnancy rather than surgical intra-abdominal process.  I doubt a bowel obstruction given minimal tenderness.  We will check labs, hydrate with IV fluids, and treat with Phenergan as she has had minimal improvement with Zofran thus far.  Lab work is unremarkable, no evidence of significant dehydration.  She is feeling better following dose of Phenergan, able to tolerate p.o. here in the ED without difficulty.  Will prescribe Phenergan for home use and I have counseled her to follow-up with her OB/GYN, otherwise return to the ED for new or worsening symptoms.  Patient agrees to plan.      ____________________________________________   FINAL CLINICAL IMPRESSION(S) / ED DIAGNOSES  Final diagnoses:  Nausea and vomiting in pregnancy     ED Discharge Orders         Ordered    promethazine (PHENERGAN) 12.5 MG tablet  Every 6 hours PRN     04/12/19 1803           Note:  This document was prepared using Dragon voice recognition software and may include unintentional dictation errors.   Blake Divine,  MD 04/12/19 1806

## 2019-04-12 NOTE — ED Notes (Signed)
Patient given coke. 

## 2019-04-12 NOTE — ED Triage Notes (Signed)
Pt reports is [redacted] weeks pregnant and has been vomiting the entire pregnancy. Pt states she is dehydrated and has not been able to have  BM for the past 3 weeks. Pt is seen at [facility] and she called them but they told her to come to the ED.

## 2019-04-14 ENCOUNTER — Telehealth: Payer: Self-pay

## 2019-04-14 NOTE — Telephone Encounter (Signed)
Pt calling; sister is positive for covid; pt was around sister and mom over the weekend; Mom has sxs and supposed to get her results today or tomorrow; son tested today b/c he has sxs and was negative; knows she needs to quarantine and get tested but c preg what to do?  [phone] [facility]

## 2019-04-14 NOTE — Telephone Encounter (Signed)
Pt returned call; adv to tx sxs; pt has safe meds list; to quarantine for 14d; number to HD given.

## 2019-04-16 ENCOUNTER — Ambulatory Visit: Payer: 59 | Attending: Internal Medicine

## 2019-04-16 ENCOUNTER — Other Ambulatory Visit: Payer: 59

## 2019-04-16 DIAGNOSIS — Z20822 Contact with and (suspected) exposure to covid-19: Secondary | ICD-10-CM

## 2019-04-18 LAB — NOVEL CORONAVIRUS, NAA: SARS-CoV-2, NAA: NOT DETECTED

## 2019-04-23 ENCOUNTER — Other Ambulatory Visit: Payer: Self-pay

## 2019-04-23 ENCOUNTER — Ambulatory Visit (INDEPENDENT_AMBULATORY_CARE_PROVIDER_SITE_OTHER): Payer: 59 | Admitting: Obstetrics and Gynecology

## 2019-04-23 ENCOUNTER — Encounter: Payer: Self-pay | Admitting: Obstetrics and Gynecology

## 2019-04-23 VITALS — BP 104/70 | Wt 231.0 lb

## 2019-04-23 DIAGNOSIS — Z363 Encounter for antenatal screening for malformations: Secondary | ICD-10-CM

## 2019-04-23 DIAGNOSIS — Z23 Encounter for immunization: Secondary | ICD-10-CM

## 2019-04-23 DIAGNOSIS — Z3480 Encounter for supervision of other normal pregnancy, unspecified trimester: Secondary | ICD-10-CM

## 2019-04-23 DIAGNOSIS — Z3A15 15 weeks gestation of pregnancy: Secondary | ICD-10-CM

## 2019-04-23 NOTE — Progress Notes (Signed)
ROB- , issues with sleeping on sides/flu shot today

## 2019-04-23 NOTE — Patient Instructions (Signed)
I value your feedback and entrusting us with your care. If you get a [facility] patient survey, I would appreciate you taking the time to let us know about your experience today. Thank you!  As of March 04, 2019, your lab results will be released to your MyChart immediately, before I even have a chance to see them. Please give me time to review them and contact you if there are any abnormalities. Thank you for your patience.  

## 2019-04-23 NOTE — Progress Notes (Signed)
  Subjective  Fetal Movement? no Contractions? no Leaking Fluid? no Vaginal Bleeding? no PNVs? Yes  Having NVP, given phenergan at ED 04/12/19. Sx improving. Had normal 1 hr GTT and materniT21. Has popping of pubic bone with turning over in bed.   Objective  BP 104/70   Wt 231 lb (104.8 kg)   LMP 01/13/2019 (Exact Date)   BMI 36.18 kg/m  General: NAD Pulmonary: no increased work of breathing Abdomen: gravid, non-tender Extremities: no edema Psychiatric: mood appropriate, affect full  Assessment  23 y.o. G2P1001 at [redacted]w[redacted]d by  10/15/2019, by Ultrasound presenting for routine prenatal visit  Plan   Problem List Items Addressed This Visit      Other   Supervision of other normal pregnancy, antepartum    Other Visit Diagnoses    [redacted] weeks gestation of pregnancy    -  Primary   Screening, antenatal, for malformation by ultrasound       Relevant Orders   US OB Follow Up     Anat u/s in 4 wks.   RTO 4 weeks  Alicia B. Copland, PA-C [facility],  04/23/2019  11:55 AM

## 2019-04-23 NOTE — Addendum Note (Signed)
Addended by: Donnetta Hail on: 04/23/2019 12:03 PM   Modules accepted: Orders

## 2019-04-26 ENCOUNTER — Encounter: Payer: Medicaid Other | Admitting: Obstetrics and Gynecology

## 2019-04-26 ENCOUNTER — Encounter: Payer: Medicaid Other | Admitting: Certified Nurse Midwife

## 2019-05-21 ENCOUNTER — Ambulatory Visit (INDEPENDENT_AMBULATORY_CARE_PROVIDER_SITE_OTHER): Payer: 59 | Admitting: Obstetrics & Gynecology

## 2019-05-21 ENCOUNTER — Ambulatory Visit (INDEPENDENT_AMBULATORY_CARE_PROVIDER_SITE_OTHER): Payer: 59

## 2019-05-21 ENCOUNTER — Other Ambulatory Visit: Payer: Self-pay

## 2019-05-21 ENCOUNTER — Encounter: Payer: Self-pay | Admitting: Obstetrics & Gynecology

## 2019-05-21 VITALS — BP 140/80 | Wt 237.0 lb

## 2019-05-21 DIAGNOSIS — Z363 Encounter for antenatal screening for malformations: Secondary | ICD-10-CM | POA: Diagnosis not present

## 2019-05-21 DIAGNOSIS — Z3480 Encounter for supervision of other normal pregnancy, unspecified trimester: Secondary | ICD-10-CM

## 2019-05-21 DIAGNOSIS — Z3A19 19 weeks gestation of pregnancy: Secondary | ICD-10-CM | POA: Diagnosis not present

## 2019-05-21 DIAGNOSIS — Z3482 Encounter for supervision of other normal pregnancy, second trimester: Secondary | ICD-10-CM

## 2019-05-21 NOTE — Patient Instructions (Signed)

## 2019-05-21 NOTE — Progress Notes (Signed)
  Subjective  Fetal Movement? yes Contractions? no Leaking Fluid? no Vaginal Bleeding? no Nausea gone Objective  BP 140/80   Wt 237 lb (107.5 kg)   LMP 01/13/2019 (Exact Date)   BMI 37.12 kg/m  General: NAD Pumonary: no increased work of breathing Abdomen: gravid, non-tender Extremities: no edema Psychiatric: mood appropriate, affect full  Assessment  23 y.o. G2P1001 at [redacted]w[redacted]d by  10/15/2019, by Ultrasound presenting for routine prenatal visit  Plan   Problem List Items Addressed This Visit      Other   Supervision of other normal pregnancy, antepartum    Other Visit Diagnoses    [redacted] weeks gestation of pregnancy    -  Primary      Pregnancy#2 Problems (from 01/13/19 to present)    Problem Noted Resolved   BMI 35.0-35.9,adult 03/29/2019 by Conard Novak, MD No   History of gestational hypertension 02/09/2019 by Vena Austria, MD No   Maternal obesity, antepartum 02/09/2019 by Vena Austria, MD No   Supervision of other normal pregnancy, antepartum 02/09/2019 by Vena Austria, MD No   Overview Addendum 05/21/2019  4:41 PM by Nadara Mustard, MD    Clinic [facility] Prenatal Labs  Dating 8 week [place] on 03/05/19 Blood type: A/Positive/-- (11/17 1008)   Genetic Screen     NIPS:nml XY Antibody:Negative (11/17 1008)  Anatomic [place] [facility] nml Rubella: 1.20 (11/17 1008) Varicella: Immune  GTT Early: 129 Third trimester:  RPR: Non Reactive (11/17 1008)   Rhogam n/a HBsAg: Negative (11/17 1008)   TDaP vaccine                       Flu Shot:1/29 HIV: Non Reactive (11/17 1008)   Baby Food        Breast                        GBS:   Contraception  Pap:08/2018  CBB  No   CS/VBAC n/a   Support Person             Review of ULTRASOUND. I have personally reviewed images and report of recent ultrasound done at [facility]. There is a singleton gestation with subjectively normal amniotic fluid volume. The fetal biometry correlates with established dating. Detailed evaluation of  the fetal anatomy was performed.The fetal anatomical survey appears within normal limits within the resolution of ultrasound as described above.  It must be noted that a normal ultrasound is unable to rule out fetal aneuploidy.    The following were addressed during this visit:  Breastfeeding Education - Early initiation of breastfeeding  - The importance of exclusive breastfeeding  - Risks of giving your baby anything other than breast milk if you are breastfeeding  - Nonpharmacological pain relief methods for labor  - The importance of early skin-to-skin contact  - Rooming-in on a 24-hour basis  - Feeding on demand or baby-led feeding  - Frequent feeding to help assure optimal milk production  - Effective positioning and attachment  - Exclusive breastfeeding for the first 6 months  - Individualized Education     Annamarie Major, MD, [facility], [facility] 05/21/2019  4:48 PM

## 2019-06-02 ENCOUNTER — Encounter: Payer: Self-pay | Admitting: Obstetrics and Gynecology

## 2019-06-02 ENCOUNTER — Other Ambulatory Visit: Payer: Self-pay

## 2019-06-02 ENCOUNTER — Encounter: Payer: Medicaid Other | Admitting: Advanced Practice Midwife

## 2019-06-02 ENCOUNTER — Telehealth: Payer: Self-pay

## 2019-06-02 ENCOUNTER — Observation Stay
Admission: EM | Admit: 2019-06-02 | Discharge: 2019-06-02 | Disposition: A | Discharge status: Home / Self Care | Payer: 59 | Attending: Certified Nurse Midwife | Admitting: Certified Nurse Midwife

## 2019-06-02 DIAGNOSIS — Z3A20 20 weeks gestation of pregnancy: Secondary | ICD-10-CM | POA: Insufficient documentation

## 2019-06-02 DIAGNOSIS — K219 Gastro-esophageal reflux disease without esophagitis: Secondary | ICD-10-CM | POA: Diagnosis not present

## 2019-06-02 DIAGNOSIS — O26892 Other specified pregnancy related conditions, second trimester: Principal | ICD-10-CM | POA: Insufficient documentation

## 2019-06-02 DIAGNOSIS — R001 Bradycardia, unspecified: Secondary | ICD-10-CM | POA: Diagnosis not present

## 2019-06-02 DIAGNOSIS — Z79899 Other long term (current) drug therapy: Secondary | ICD-10-CM | POA: Insufficient documentation

## 2019-06-02 DIAGNOSIS — Z880 Allergy status to penicillin: Secondary | ICD-10-CM | POA: Diagnosis not present

## 2019-06-02 DIAGNOSIS — Z3480 Encounter for supervision of other normal pregnancy, unspecified trimester: Secondary | ICD-10-CM

## 2019-06-02 DIAGNOSIS — L409 Psoriasis, unspecified: Secondary | ICD-10-CM | POA: Insufficient documentation

## 2019-06-02 DIAGNOSIS — Z8759 Personal history of other complications of pregnancy, childbirth and the puerperium: Secondary | ICD-10-CM

## 2019-06-02 DIAGNOSIS — O99891 Other specified diseases and conditions complicating pregnancy: Secondary | ICD-10-CM | POA: Diagnosis present

## 2019-06-02 DIAGNOSIS — M549 Dorsalgia, unspecified: Secondary | ICD-10-CM | POA: Diagnosis present

## 2019-06-02 DIAGNOSIS — Z6835 Body mass index (BMI) 35.0-35.9, adult: Secondary | ICD-10-CM

## 2019-06-02 DIAGNOSIS — Z9109 Other allergy status, other than to drugs and biological substances: Secondary | ICD-10-CM | POA: Diagnosis not present

## 2019-06-02 DIAGNOSIS — O99210 Obesity complicating pregnancy, unspecified trimester: Secondary | ICD-10-CM

## 2019-06-02 LAB — COMPREHENSIVE METABOLIC PANEL
ALT: 15 U/L (ref 0–44)
AST: 17 U/L (ref 15–41)
Albumin: 3.1 g/dL — ABNORMAL LOW (ref 3.5–5.0)
Alkaline Phosphatase: 69 U/L (ref 38–126)
Anion gap: 11 (ref 5–15)
BUN: 6 mg/dL (ref 6–20)
CO2: 21 mmol/L — ABNORMAL LOW (ref 22–32)
Calcium: 8.7 mg/dL — ABNORMAL LOW (ref 8.9–10.3)
Chloride: 103 mmol/L (ref 98–111)
Creatinine, Ser: 0.47 mg/dL (ref 0.44–1.00)
GFR calc Af Amer: >60 mL/min (ref >60)
GFR calc non Af Amer: >60 mL/min (ref >60)
Glucose, Bld: 150 mg/dL — ABNORMAL HIGH (ref 70–99)
Potassium: 3 mmol/L — ABNORMAL LOW (ref 3.5–5.1)
Sodium: 135 mmol/L (ref 135–145)
Total Bilirubin: 0.7 mg/dL (ref 0.3–1.2)
Total Protein: 6.3 g/dL — ABNORMAL LOW (ref 6.5–8.1)

## 2019-06-02 LAB — URINALYSIS, COMPLETE (UACMP) WITH MICROSCOPIC
Bilirubin Urine: NEGATIVE
Glucose, UA: NEGATIVE mg/dL
Hgb urine dipstick: NEGATIVE
Ketones, ur: 5 mg/dL — AB
Nitrite: POSITIVE — AB
Protein, ur: NEGATIVE mg/dL
Specific Gravity, Urine: 1.006 (ref 1.005–1.030)
pH: 6 (ref 5.0–8.0)

## 2019-06-02 LAB — CBC
HCT: 37.2 % (ref 36.0–46.0)
Hemoglobin: 12.6 g/dL (ref 12.0–15.0)
MCH: 31.3 pg (ref 26.0–34.0)
MCHC: 33.9 g/dL (ref 30.0–36.0)
MCV: 92.3 fL (ref 80.0–100.0)
Platelets: 205 10*3/uL (ref 150–400)
RBC: 4.03 MIL/uL (ref 3.87–5.11)
RDW: 14.2 % (ref 11.5–15.5)
WBC: 11.9 10*3/uL — ABNORMAL HIGH (ref 4.0–10.5)
nRBC: 0 % (ref 0.0–0.2)

## 2019-06-02 LAB — PROTEIN / CREATININE RATIO, URINE
Creatinine, Urine: 49 mg/dL
Total Protein, Urine: <6 mg/dL

## 2019-06-02 MED ORDER — NITROFURANTOIN MONOHYD MACRO 100 MG PO CAPS: 100.0000 mg | ORAL_CAPSULE | Freq: Two times a day (BID) | ORAL | 0 refills | Status: AC

## 2019-06-02 NOTE — Discharge Summary (Signed)
RN reviewed discharge instructions with patient. Gave patient opportunity to ask questions. All questions answered at this time. Pt verbalized understanding. Pt discharged home with her mother.

## 2019-06-02 NOTE — OB Triage Note (Signed)
Pt presents c/o "very bad lower back pain that made me feel nauseous". Pt states she "has headaches that sometimes goes away with tylenol and sometimes they don't". Pt has 1+ reflexes and no clonus or swelling. Denies bleeding. Pt states she "went to the bathroom today and my underwear were wet and had a watery appearance". Bp slightly elevated. BPS cycling q15. All other vitals are normal. Will continue to monitor.

## 2019-06-02 NOTE — Telephone Encounter (Signed)
Pt calling; is 21wks; lower back hurts; hurts to sit; this is how PTL started with her son at 34wks; went to bathroom and underwear is wet c watery d/c.  Is scared.  [phone]  Pt was not able to come to office when we had availability so was adv to go to L&D via ED.  [place] notified.

## 2019-06-02 NOTE — Final Progress Note (Signed)
Physician Final Progress Note  Patient ID: OLLIE DELANO MRN: [id number] DOB/AGE: [birth year] 23 y.o.  Admit date: 06/02/2019 Admitting provider: Will Bonnet, MD/ Jesus Genera. Danise Mina, [place] Discharge date: 06/02/2019   Admission Diagnoses: IUP at [facility] with back ache, and leaking fluid from the vagina  Discharge Diagnoses:  IUP at [facility] with back pain Probable UTI NO evidence of PPROM  Consults: None  Significant Findings/ Diagnostic Studies:  OB History & Physical   History of Present Illness:  Chief Complaint:  Low back pain and leakage of fluid HPI:  Isabel Higgins is a 24 y.o. G42P1001 female with EDC=10/15/2019 at [redacted]w[redacted]d dated by an 8 week ultrasound.  Her pregnancy has been complicated by obesity and a maternal history of gestational hypertension.  She presents to L&D with complaints of sacral back pain that began today at work. The pain waxed and waned, but did not stop. Nothing made it worse and nothing made it better. The pain began while she was sitting down. +FM. Also complained of leakage of fluid this Am when she went to the [place]. No vaginal bleeding, dysuria, urinary frequency, or bad odor to her urine.    Prenatal care site: Prenatal care at [facility] has also been remarkable for  Impact Prenatal Labs  Dating 8 week [place] on 03/05/19 Blood type: A/Positive/-- (11/17 1008)   Genetic Screen     NIPS:nml XY Antibody:Negative (11/17 1008)  Anatomic [place] [facility] nml Rubella: 1.20 (11/17 1008) Varicella: Immune  GTT Early: 14 Third trimester:  RPR: Non Reactive (11/17 1008)   Rhogam n/a HBsAg: Negative (11/17 1008)   TDaP vaccine                       Flu Shot:1/29 HIV: Non Reactive (11/17 1008)   Baby Food                                GBS:   Contraception  Pap:08/2018  CBB  No   CS/VBAC n/a   Support Person     Of note she has had blood pressures of 130/80 and 140/80 at her [place] visits prior to 20 weeks.      Maternal Medical History:   Past  Medical History:  Diagnosis Date  . Bradycardia   . Depression   . GERD (gastroesophageal reflux disease)   . Mental disorder   . Psoriasis     Past Surgical History:  Procedure Laterality Date  . INNER EAR SURGERY     right  . LAPAROSCOPY N/A 11/19/2018   Procedure: LAPAROSCOPY DIAGNOSTIC AND BIOPSY;  Surgeon: Gae Dry, MD;  Location: [facility] ORS;  Service: Gynecology;  Laterality: N/A;  . SHOULDER SURGERY     right    Allergies  Allergen Reactions  . Honeysuckle Flower [Lonicera] Swelling, Rash and Other (See Comments)    Other Reaction: BREAK OUT, FACIAL SWELLING, WE  . Amoxicillin Nausea And Vomiting    Did it involve swelling of the face/tongue/throat, SOB, or low BP? No Did it involve sudden or severe rash/hives, skin peeling, or any reaction on the inside of your mouth or nose? No Did you need to seek medical attention at a hospital or doctor's office? No When did it last happen?Can tolerate but gets an upset stomach If all above answers are "NO", may proceed with cephalosporin use.     Prior to Admission medications  Medication Sig Start Date End Date Taking? Authorizing Provider  calcium carbonate (TUMS - DOSED IN MG ELEMENTAL CALCIUM) 500 MG chewable tablet Chew 1 tablet by mouth 2 (two) times daily as needed for indigestion or heartburn.   Yes [provider]  folic acid (FOLVITE) 1 MG tablet Take 1 tablet (1 mg total) by mouth daily. 02/09/19  Yes Vena Austria, MD  ondansetron (ZOFRAN ODT) 8 MG disintegrating tablet Take 1 tablet (8 mg total) by mouth every 8 (eight) hours as needed for nausea or vomiting. 03/29/19  Yes Conard Novak, MD  pantoprazole (PROTONIX) 20 MG tablet Take 1 tablet (20 mg total) by mouth daily as needed. 03/08/19  Yes Nadara Mustard, MD  Prenatal-DSS-FeCb-FeGl-FA (CITRANATAL BLOOM) 90-1 MG TABS Take 1 tablet by mouth daily. 03/10/19  Yes Vena Austria, MD  promethazine (PHENERGAN) 12.5 MG tablet Take 1 tablet  (12.5 mg total) by mouth every 6 (six) hours as needed for nausea or vomiting. 04/12/19  Yes Chesley Noon, MD         Social History: She  reports that she has never smoked. She has never used smokeless tobacco. She reports that she does not drink alcohol or use drugs.  Family History: family history includes Diabetes in her maternal grandfather and paternal grandfather; Heart attack in her maternal grandmother; Hypertension in her father.   Review of Systems: Negative x 10 systems reviewed except as noted in the HPI.      Physical Exam:  Vital Signs: BP 112/75   Pulse (!) 104   Temp 98 F (36.7 C) (Oral)   Resp 18   Ht 5\' 7"  (1.702 m)   Wt 107.5 kg   LMP 01/13/2019 (Exact Date)   SpO2 100%   BMI 37.12 kg/m   Vital signs:  Patient Vitals for the past 24 hrs:  BP Temp Temp src Pulse Resp SpO2 Height Weight  06/02/19 1647 112/75 -- -- (!) 104 -- -- -- --  06/02/19 1633 105/60 -- -- 97 -- -- -- --  06/02/19 1618 112/66 -- -- (!) 105 -- -- -- --  06/02/19 1603 111/68 -- -- (!) 106 -- -- -- --  06/02/19 1548 120/69 -- -- (!) 105 -- -- -- --  06/02/19 1533 119/70 -- -- (!) 114 -- -- -- --  06/02/19 1518 120/72 -- -- (!) 105 -- 100 % -- --  06/02/19 1510 (!) 144/82 98 F (36.7 C) Oral (!) 115 18 97 % 5\' 7"  (1.702 m) 107.5 kg   General: no acute distress.  HEENT: normocephalic, atraumatic Lungs: normal respiratory effort Abdomen: soft, gravid, non-tender Back: No CVAT. No SI tenderness with palpation Pelvic:SSE   External: Normal external female genitalia  Vagina: small amount of white mucoepithelial discharge  Wet [place]: - clue cells; - trichomonas;  - yeast  ROM: - pooling;- ferning Extremities: non-tender, symmetric, no edema bilaterally.   Neurologic: Alert & oriented x 3.    Pertinent Results:   Results for orders placed or performed during the hospital encounter of 06/02/19 (from the past 24 hour(s))  Urinalysis, Complete w Microscopic     Status: Abnormal    Collection Time: 06/02/19  3:49 PM  Result Value Ref Range   Color, Urine YELLOW (A) YELLOW   APPearance HAZY (A) CLEAR   Specific Gravity, Urine 1.006 1.005 - 1.030   pH 6.0 5.0 - 8.0   Glucose, UA NEGATIVE NEGATIVE mg/dL   Hgb urine dipstick NEGATIVE NEGATIVE   Bilirubin Urine NEGATIVE  NEGATIVE   Ketones, ur 5 (A) NEGATIVE mg/dL   Protein, ur NEGATIVE NEGATIVE mg/dL   Nitrite POSITIVE (A) NEGATIVE   Leukocytes,Ua TRACE (A) NEGATIVE   RBC / HPF 0-5 0 - 5 RBC/hpf   WBC, UA 0-5 0 - 5 WBC/hpf   Bacteria, UA RARE (A) NONE SEEN   Squamous Epithelial / LPF 0-5 0 - 5   Mucus PRESENT    Amorphous Crystal PRESENT   Protein / creatinine ratio, urine     Status: None   Collection Time: 06/02/19  3:50 PM  Result Value Ref Range   Creatinine, Urine 49 mg/dL   Total Protein, Urine <6 mg/dL   Protein Creatinine Ratio        0.00 - 0.15 mg/mg[Cre]  CBC     Status: Abnormal   Collection Time: 06/02/19  3:59 PM  Result Value Ref Range   WBC 11.9 (H) 4.0 - 10.5 K/uL   RBC 4.03 3.87 - 5.11 MIL/uL   Hemoglobin 12.6 12.0 - 15.0 g/dL   HCT 19.6 22.2 - 97.9 %   MCV 92.3 80.0 - 100.0 fL   MCH 31.3 26.0 - 34.0 pg   MCHC 33.9 30.0 - 36.0 g/dL   RDW 89.2 11.9 - 41.7 %   Platelets 205 150 - 400 K/uL   nRBC 0.0 0.0 - 0.2 %  Comprehensive metabolic panel     Status: Abnormal   Collection Time: 06/02/19  3:59 PM  Result Value Ref Range   Sodium 135 135 - 145 mmol/L   Potassium 3.0 (L) 3.5 - 5.1 mmol/L   Chloride 103 98 - 111 mmol/L   CO2 21 (L) 22 - 32 mmol/L   Glucose, Bld 150 (H) 70 - 99 mg/dL   BUN 6 6 - 20 mg/dL   Creatinine, Ser 4.08 0.44 - 1.00 mg/dL   Calcium 8.7 (L) 8.9 - 10.3 mg/dL   Total Protein 6.3 (L) 6.5 - 8.1 g/dL   Albumin 3.1 (L) 3.5 - 5.0 g/dL   AST 17 15 - 41 U/L   ALT 15 0 - 44 U/L   Alkaline Phosphatase 69 38 - 126 U/L   Total Bilirubin 0.7 0.3 - 1.2 mg/dL   GFR calc non Af Amer >60 >60 mL/min   GFR calc Af Amer >60 >60 mL/min   Anion gap 11 5 - 15    Baseline FHR:  150s   Bedside Ultrasound:  Anterior placenta  Presentation: variable  AF: subjectively adequate  Assessment/ Plan:  Isabel Higgins is a 24 y.o. G57P1001 female at [redacted]w[redacted]d with back pain  Possible UTI (positive nitrite, trace leukocytes)  Urine culture  Start Macrobid BID x 7 days No evidence of PPROM Initial BP was elevated, which prompted preeclampsia labs, which were normal.   R/O CHTN      Procedures: none  Discharge Condition: stable  Disposition: Discharge disposition: 01-Home or Self Care       Diet: Regular diet  Discharge Activity: Activity as tolerated/ Given work excuse letter for today and tomorrow   Allergies as of 06/02/2019      Reactions   Honeysuckle Flower [lonicera] Swelling, Rash, Other (See Comments)   Other Reaction: BREAK OUT, FACIAL SWELLING, WE   Amoxicillin Nausea And Vomiting   Did it involve swelling of the face/tongue/throat, SOB, or low BP? No Did it involve sudden or severe rash/hives, skin peeling, or any reaction on the inside of your mouth or nose? No Did you need to seek medical  attention at a hospital or doctor's office? No When did it last happen?Can tolerate but gets an upset stomach If all above answers are "NO", may proceed with cephalosporin use.      Medication List    STOP taking these medications   omeprazole 20 MG capsule Commonly known as: PRILOSEC     TAKE these medications   calcium carbonate 500 MG chewable tablet Commonly known as: TUMS - dosed in mg elemental calcium Chew 1 tablet by mouth 2 (two) times daily as needed for indigestion or heartburn.   CitraNatal Bloom 90-1 MG Tabs Take 1 tablet by mouth daily.   folic acid 1 MG tablet Commonly known as: FOLVITE Take 1 tablet (1 mg total) by mouth daily.   nitrofurantoin (macrocrystal-monohydrate) 100 MG capsule Commonly known as: Macrobid Take 1 capsule (100 mg total) by mouth 2 (two) times daily for 7 days.   ondansetron 8 MG disintegrating  tablet Commonly known as: Zofran ODT Take 1 tablet (8 mg total) by mouth every 8 (eight) hours as needed for nausea or vomiting.   pantoprazole 20 MG tablet Commonly known as: Protonix Take 1 tablet (20 mg total) by mouth daily as needed.   promethazine 12.5 MG tablet Commonly known as: PHENERGAN Take 1 tablet (12.5 mg total) by mouth every 6 (six) hours as needed for nausea or vomiting.        Total time spent taking care of this patient: 20 minutes  Signed: Farrel Conners 06/02/2019, 5:17 PM

## 2019-06-11 ENCOUNTER — Encounter: Payer: Self-pay | Admitting: Obstetrics and Gynecology

## 2019-06-11 ENCOUNTER — Other Ambulatory Visit: Payer: Self-pay

## 2019-06-11 ENCOUNTER — Other Ambulatory Visit: Payer: Self-pay | Admitting: Obstetrics and Gynecology

## 2019-06-11 ENCOUNTER — Observation Stay
Admission: EM | Admit: 2019-06-11 | Discharge: 2019-06-11 | Disposition: A | Discharge status: Home / Self Care | Payer: 59 | Attending: Obstetrics and Gynecology | Admitting: Obstetrics and Gynecology

## 2019-06-11 DIAGNOSIS — M549 Dorsalgia, unspecified: Secondary | ICD-10-CM

## 2019-06-11 DIAGNOSIS — O99891 Other specified diseases and conditions complicating pregnancy: Principal | ICD-10-CM | POA: Insufficient documentation

## 2019-06-11 DIAGNOSIS — O99210 Obesity complicating pregnancy, unspecified trimester: Secondary | ICD-10-CM

## 2019-06-11 DIAGNOSIS — Z3480 Encounter for supervision of other normal pregnancy, unspecified trimester: Secondary | ICD-10-CM

## 2019-06-11 DIAGNOSIS — O99212 Obesity complicating pregnancy, second trimester: Secondary | ICD-10-CM | POA: Insufficient documentation

## 2019-06-11 DIAGNOSIS — O162 Unspecified maternal hypertension, second trimester: Secondary | ICD-10-CM | POA: Diagnosis not present

## 2019-06-11 DIAGNOSIS — O26899 Other specified pregnancy related conditions, unspecified trimester: Secondary | ICD-10-CM

## 2019-06-11 DIAGNOSIS — Z3A22 22 weeks gestation of pregnancy: Secondary | ICD-10-CM | POA: Diagnosis not present

## 2019-06-11 DIAGNOSIS — Z6835 Body mass index (BMI) 35.0-35.9, adult: Secondary | ICD-10-CM

## 2019-06-11 DIAGNOSIS — Z8759 Personal history of other complications of pregnancy, childbirth and the puerperium: Secondary | ICD-10-CM

## 2019-06-11 LAB — URINALYSIS, COMPLETE (UACMP) WITH MICROSCOPIC
Bacteria, UA: NONE SEEN
Bilirubin Urine: NEGATIVE
Glucose, UA: NEGATIVE mg/dL
Hgb urine dipstick: NEGATIVE
Ketones, ur: 20 mg/dL — AB
Leukocytes,Ua: NEGATIVE
Nitrite: NEGATIVE
Protein, ur: 30 mg/dL — AB
Specific Gravity, Urine: 1.026 (ref 1.005–1.030)
pH: 5 (ref 5.0–8.0)

## 2019-06-11 MED ORDER — PREDNISONE 10 MG PO TABS: ORAL_TABLET | ORAL | 0 refills | Status: DC

## 2019-06-11 MED ORDER — CYCLOBENZAPRINE HCL 10 MG PO TABS: 10.0000 mg | ORAL_TABLET | Freq: Three times a day (TID) | ORAL | 0 refills | Status: DC | PRN

## 2019-06-11 MED ORDER — CYCLOBENZAPRINE HCL 10 MG PO TABS
10.0000 mg | ORAL_TABLET | Freq: Three times a day (TID) | ORAL | Status: DC | PRN
  Filled 2019-06-11: qty 1

## 2019-06-11 NOTE — Discharge Summary (Signed)
Physician Final Progress Note  Patient ID: Isabel Higgins MRN: [id number] DOB/AGE: Mar 03, 1996 23 y.o.  Admit date: 06/11/2019 Admitting provider: Tresea Mall, CNM Discharge date: 06/11/2019   Admission Diagnoses: Suprapubic and back pain  Discharge Diagnoses:  Active Problems:   Indication for care in labor and delivery, antepartum  24 y.o. G2P1001 at [redacted]w[redacted]d by Estimated Date of Delivery: 10/15/19 presenting for suprapubic and lower back pain worse with ambulation.  She reports +FM, no LOF, no VB, no ctx.  Her pregnancy thus far has been uncomplicated other than maternal obesity.  Early 1-hr glucose was negative.  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  Will have patient trial a prednisone taper as well as flexeril. Discussed supportive measures such as localized heat, Tylenol, and topical lineament creams.   Arrangement for outpatient PT.  Has follow up 06/17/2019.  Pregnancy#2 Problems (from 01/13/19 to present)    Problem Noted Resolved   BMI 35.0-35.9,adult 03/29/2019 by Conard Novak, MD No   History of gestational hypertension 02/09/2019 by Vena Austria, MD No   Maternal obesity, antepartum 02/09/2019 by Vena Austria, MD No   Supervision of other normal pregnancy, antepartum 02/09/2019 by Vena Austria, MD No   Overview Addendum 05/21/2019  4:41 PM by Nadara Mustard, MD    [facility] Prenatal Labs  Dating 8 week [place] on 03/05/19 Blood type: A/Positive/-- (11/17 1008)   Genetic Screen     NIPS:nml XY Antibody:Negative (11/17 1008)  Anatomic [place] [facility] nml Rubella: 1.20 (11/17 1008) Varicella: Immune  GTT Early: 129 Third trimester:  RPR: Non Reactive (11/17 1008)   Rhogam n/a HBsAg: Negative (11/17 1008)   TDaP vaccine                       Flu Shot:1/29 HIV: Non Reactive (11/17 1008)   Baby Food                                GBS:   Contraception  Pap:08/2018  CBB  No   CS/VBAC n/a    Support Person             Blood pressure 118/74, pulse (!) 104, temperature 98.5 F (36.9 C), temperature source Oral, resp. rate 18, height 5\' 7"  (1.702 m), weight 107.5 kg, last menstrual period 01/13/2019, currently breastfeeding.  Consults: None  Significant Findings/ Diagnostic Studies: Results for orders placed or performed during the hospital encounter of 06/11/19 (from the past 24 hour(s))  Urinalysis, Complete w Microscopic     Status: Abnormal   Collection Time: 06/11/19  3:37 PM  Result Value Ref Range   Color, Urine AMBER (A) YELLOW   APPearance CLOUDY (A) CLEAR   Specific Gravity, Urine 1.026 1.005 - 1.030   pH 5.0 5.0 - 8.0   Glucose, UA NEGATIVE NEGATIVE mg/dL   Hgb urine dipstick NEGATIVE NEGATIVE   Bilirubin Urine NEGATIVE NEGATIVE   Ketones, ur 20 (A) NEGATIVE mg/dL   Protein, ur 30 (A) NEGATIVE mg/dL   Nitrite NEGATIVE NEGATIVE   Leukocytes,Ua NEGATIVE NEGATIVE   RBC / HPF 0-5 0 - 5 RBC/hpf   WBC, UA 0-5 0 - 5 WBC/hpf   Bacteria, UA NONE SEEN NONE SEEN   Squamous Epithelial / LPF 0-5 0 - 5   Mucus PRESENT    Ca Oxalate Crys, UA PRESENT  Procedures: none  Discharge Condition: good  Disposition: Discharge disposition: 01-Home or Self Care       Diet: Regular diet  Discharge Activity: Bedrest  Discharge Instructions    Discharge activity:  No Restrictions   Complete by: As directed    Discharge diet:  No restrictions   Complete by: As directed    No sexual activity restrictions   Complete by: As directed    Notify physician for a general feeling that "something is not right"   Complete by: As directed    Notify physician for increase or change in vaginal discharge   Complete by: As directed    Notify physician for intestinal cramps, with or without diarrhea, sometimes described as "gas pain"   Complete by: As directed    Notify physician for leaking of fluid   Complete by: As directed    Notify physician for low, dull backache,  unrelieved by heat or Tylenol   Complete by: As directed    Notify physician for menstrual like cramps   Complete by: As directed    Notify physician for pelvic pressure   Complete by: As directed    Notify physician for uterine contractions.  These may be painless and feel like the uterus is tightening or the baby is  "balling up"   Complete by: As directed    Notify physician for vaginal bleeding   Complete by: As directed    PRETERM LABOR:  Includes any of the follwing symptoms that occur between 20 - [redacted] weeks gestation.  If these symptoms are not stopped, preterm labor can result in preterm delivery, placing your baby at risk   Complete by: As directed      Allergies as of 06/11/2019      Reactions   Honeysuckle Flower [lonicera] Swelling, Rash, Other (See Comments)   Other Reaction: BREAK OUT, FACIAL SWELLING, WE   Amoxicillin Nausea And Vomiting   Did it involve swelling of the face/tongue/throat, SOB, or low BP? No Did it involve sudden or severe rash/hives, skin peeling, or any reaction on the inside of your mouth or nose? No Did you need to seek medical attention at a hospital or doctor's office? No When did it last happen?Can tolerate but gets an upset stomach If all above answers are "NO", may proceed with cephalosporin use.      Medication List    TAKE these medications   calcium carbonate 500 MG chewable tablet Commonly known as: TUMS - dosed in mg elemental calcium Chew 1 tablet by mouth 2 (two) times daily as needed for indigestion or heartburn.   CitraNatal Bloom 90-1 MG Tabs Take 1 tablet by mouth daily.   cyclobenzaprine 10 MG tablet Commonly known as: FLEXERIL Take 1 tablet (10 mg total) by mouth 3 (three) times daily as needed for muscle spasms.   folic acid 1 MG tablet Commonly known as: FOLVITE Take 1 tablet (1 mg total) by mouth daily.   ondansetron 8 MG disintegrating tablet Commonly known as: Zofran ODT Take 1 tablet (8 mg total) by mouth  every 8 (eight) hours as needed for nausea or vomiting.   pantoprazole 20 MG tablet Commonly known as: Protonix Take 1 tablet (20 mg total) by mouth daily as needed.   predniSONE 10 MG tablet Commonly known as: DELTASONE Take 6 tablets (60 mg total) by mouth daily for 1 day, THEN 5 tablets (50 mg total) daily for 1 day, THEN 4 tablets (40 mg total) daily for 1 day,  THEN 3 tablets (30 mg total) daily for 1 day, THEN 2 tablets (20 mg total) daily for 1 day, THEN 1 tablet (10 mg total) daily for 1 day. Start taking on: June 11, 2019   promethazine 12.5 MG tablet Commonly known as: PHENERGAN Take 1 tablet (12.5 mg total) by mouth every 6 (six) hours as needed for nausea or vomiting.        Total time spent taking care of this patient: 30 minutes  Signed: Malachy Mood 06/11/2019, 5:46 PM

## 2019-06-11 NOTE — Progress Notes (Signed)
Pt discharged home per A.Bonney Aid, MD order. Pt given AVS and discharge instructions. RN and MD spoke with pt about medication regime, physical therapy, and other supportive measures to alleviate suprapubic and back pain. Medications sent to pharmacy on file. Pt verbalized understanding, medications sent to pharmacy on file. All questions answered by RN. Pt encouraged to keep follow up appointment on 06/17/2019 with C.Gutierrez, CNM.  Pt discharged home via wheelchair and to personal vehicle.

## 2019-06-11 NOTE — OB Triage Note (Addendum)
Pt presents to L&D complaining of abdominal pain. Pt is a 24 y/o G2P1001 [redacted]w[redacted]d. Pt denies leaking of fluid or vaginal bleeding and states she is feeling baby move and "flutters". Pt reports coming into L&D about a week ago and had a bacteria infection, was prescribed antibiotics, pt states she completed the course and has felt worse since yesterday. Pt reports back pain 5/10 describes it as intermittent back pain and also reports 6/10 abdominal pain, pt describes it as "popping discomfort" pain that wraps around to the back. Pt also reports abdomen/pelvis hurting when mobile.  Pt is tender when RN palpated around the abdomen. Doppler and external monitor used to obtain FHT. Initial FHT 150. VSS. J.Gledhill,CNM made aware. Orders placed.

## 2019-06-13 LAB — URINE CULTURE: Culture: >=100000 — AB

## 2019-06-14 ENCOUNTER — Observation Stay
Admission: EM | Admit: 2019-06-14 | Discharge: 2019-06-14 | Disposition: A | Discharge status: Home / Self Care | Payer: 59 | Attending: Obstetrics & Gynecology | Admitting: Obstetrics & Gynecology

## 2019-06-14 ENCOUNTER — Other Ambulatory Visit: Payer: Self-pay

## 2019-06-14 ENCOUNTER — Encounter: Payer: Self-pay | Admitting: Advanced Practice Midwife

## 2019-06-14 ENCOUNTER — Other Ambulatory Visit: Payer: Self-pay | Admitting: Advanced Practice Midwife

## 2019-06-14 DIAGNOSIS — O99612 Diseases of the digestive system complicating pregnancy, second trimester: Secondary | ICD-10-CM | POA: Diagnosis not present

## 2019-06-14 DIAGNOSIS — Z3A22 22 weeks gestation of pregnancy: Secondary | ICD-10-CM | POA: Insufficient documentation

## 2019-06-14 DIAGNOSIS — O26892 Other specified pregnancy related conditions, second trimester: Principal | ICD-10-CM | POA: Insufficient documentation

## 2019-06-14 DIAGNOSIS — Z79899 Other long term (current) drug therapy: Secondary | ICD-10-CM | POA: Insufficient documentation

## 2019-06-14 DIAGNOSIS — K219 Gastro-esophageal reflux disease without esophagitis: Secondary | ICD-10-CM | POA: Diagnosis not present

## 2019-06-14 DIAGNOSIS — O2342 Unspecified infection of urinary tract in pregnancy, second trimester: Secondary | ICD-10-CM

## 2019-06-14 MED ORDER — CEPHALEXIN 500 MG PO CAPS: 500.0000 mg | ORAL_CAPSULE | Freq: Three times a day (TID) | ORAL | 0 refills | Status: DC

## 2019-06-14 NOTE — Progress Notes (Unsigned)
Rx Keflex sent to patient pharmacy to treat >100k multiple species culture.

## 2019-06-14 NOTE — OB Triage Note (Signed)
Pt is a G2P1 at 22.3 who had a fall today around 7pm. She felt that her hip gave out and she fell to the ground to her knees and hands. She doesn't fell she hit her belly. Pt reports good fetal movement and states at fisrt she felt cramping but now feels fine.

## 2019-06-14 NOTE — Discharge Summary (Signed)
Physician Final Progress Note  Patient ID: Isabel Higgins MRN: [id number] DOB/AGE: [birth year] 25 y.o.  Admit date: 06/14/2019 Admitting provider: Rod Can, CNM Discharge date: 06/14/2019   Admission Diagnoses: fall during pregnancy  Discharge Diagnoses:  Active Problems:   Indication for care in labor and delivery, antepartum IUP at 22 weeks   History of Present Illness: The patient is a 24 y.o. female G2P1001 at [redacted]w[redacted]d who presents for a fall around 7 pm tonight. She fell when her "hip gave out" and hit her knees and then hands. She denies hitting her belly. She reports positive fetal movement. She initially felt some mild cramping. She denies bleeding or leakage of fluid. She denies contractions.   Patient was admitted for observation. She was placed on tocometer. Intermittent monitoring given gestational age.   Patient was discharged to home following reassuring monitoring. She has a follow up appointment scheduled with [facility].   Past Medical History:  Diagnosis Date  . Bradycardia   . Depression   . GERD (gastroesophageal reflux disease)   . Menometrorrhagia 08/07/2016  . Mental disorder   . Psoriasis     Past Surgical History:  Procedure Laterality Date  . INNER EAR SURGERY     right  . LAPAROSCOPY N/A 11/19/2018   Procedure: LAPAROSCOPY DIAGNOSTIC AND BIOPSY;  Surgeon: Gae Dry, MD;  Location: [facility] ORS;  Service: Gynecology;  Laterality: N/A;  . SHOULDER SURGERY     right    No current facility-administered medications on file prior to encounter.   Current Outpatient Medications on File Prior to Encounter  Medication Sig Dispense Refill  . calcium carbonate (TUMS - DOSED IN MG ELEMENTAL CALCIUM) 500 MG chewable tablet Chew 1 tablet by mouth 2 (two) times daily as needed for indigestion or heartburn.    . cephALEXin (KEFLEX) 500 MG capsule Take 1 capsule (500 mg total) by mouth 3 (three) times daily. 21 capsule 0  . cyclobenzaprine (FLEXERIL) 10 MG  tablet Take 1 tablet (10 mg total) by mouth 3 (three) times daily as needed for muscle spasms. 30 tablet 0  . folic acid (FOLVITE) 1 MG tablet Take 1 tablet (1 mg total) by mouth daily. 30 tablet 10  . ondansetron (ZOFRAN ODT) 8 MG disintegrating tablet Take 1 tablet (8 mg total) by mouth every 8 (eight) hours as needed for nausea or vomiting. 30 tablet 1  . pantoprazole (PROTONIX) 20 MG tablet Take 1 tablet (20 mg total) by mouth daily as needed. 30 tablet 4  . predniSONE (DELTASONE) 10 MG tablet Take 6 tablets (60 mg total) by mouth daily for 1 day, THEN 5 tablets (50 mg total) daily for 1 day, THEN 4 tablets (40 mg total) daily for 1 day, THEN 3 tablets (30 mg total) daily for 1 day, THEN 2 tablets (20 mg total) daily for 1 day, THEN 1 tablet (10 mg total) daily for 1 day. 21 tablet 0  . Prenatal-DSS-FeCb-FeGl-FA (CITRANATAL BLOOM) 90-1 MG TABS Take 1 tablet by mouth daily. 30 tablet 11  . promethazine (PHENERGAN) 12.5 MG tablet Take 1 tablet (12.5 mg total) by mouth every 6 (six) hours as needed for nausea or vomiting. 15 tablet 0    Allergies  Allergen Reactions  . Honeysuckle Flower [Lonicera] Swelling, Rash and Other (See Comments)    Other Reaction: BREAK OUT, FACIAL SWELLING, WE  . Amoxicillin Nausea And Vomiting    Did it involve swelling of the face/tongue/throat, SOB, or low BP? No Did it involve sudden or  severe rash/hives, skin peeling, or any reaction on the inside of your mouth or nose? No Did you need to seek medical attention at a hospital or doctor's office? No When did it last happen?Can tolerate but gets an upset stomach If all above answers are "NO", may proceed with cephalosporin use.     Social History   Socioeconomic History  . Marital status: Married    Spouse name: Not on file  . Number of children: Not on file  . Years of education: Not on file  . Highest education level: Not on file  Occupational History  . Not on file  Tobacco Use  . Smoking  status: Never Smoker  . Smokeless tobacco: Never Used  Substance and Sexual Activity  . Alcohol use: No    Alcohol/week: 0.0 standard drinks  . Drug use: No  . Sexual activity: Yes    Birth control/protection: I.U.D.  Other Topics Concern  . Not on file  Social History Narrative  . Not on file   Social Determinants of Health   Financial Resource Strain:   . Difficulty of Paying Living Expenses:   Food Insecurity:   . Worried About Programme researcher, broadcasting/film/video in the Last Year:   . Barista in the Last Year:   Transportation Needs:   . Freight forwarder (Medical):   Marland Kitchen Lack of Transportation (Non-Medical):   Physical Activity:   . Days of Exercise per Week:   . Minutes of Exercise per Session:   Stress:   . Feeling of Stress :   Social Connections:   . Frequency of Communication with Friends and Family:   . Frequency of Social Gatherings with Friends and Family:   . Attends Religious Services:   . Active Member of Clubs or Organizations:   . Attends Banker Meetings:   Marland Kitchen Marital Status:   Intimate Partner Violence:   . Fear of Current or Ex-Partner:   . Emotionally Abused:   Marland Kitchen Physically Abused:   . Sexually Abused:     Family History  Problem Relation Age of Onset  . Hypertension Father   . Diabetes Maternal Grandfather   . Heart attack Maternal Grandmother        x5  . Diabetes Paternal Grandfather      Review of Systems  Constitutional: Negative.   HENT: Negative.   Eyes: Negative.   Respiratory: Negative.   Cardiovascular: Negative.   Gastrointestinal: Negative.   Genitourinary: Negative.   Musculoskeletal: Negative.   Skin: Negative.   Neurological: Negative.   Endo/Heme/Allergies: Negative.   Psychiatric/Behavioral: Negative.      Physical Exam: BP 115/69   Pulse 82   Temp 98.6 F (37 C) (Oral)   Resp 16   LMP 01/13/2019 (Exact Date)   SpO2 97%   Constitutional: Well nourished, well developed female in no acute distress.   HEENT: normal Skin: Warm and dry.  Cardiovascular: Regular rate and rhythm.   Respiratory: Clear to auscultation bilateral. Normal respiratory effort Abdomen: FHT present Neuro: DTRs 2+, Cranial nerves grossly intact Psych: Alert and Oriented x3. No memory deficits. Normal mood and affect.  MS: normal gait, normal bilateral lower extremity ROM/strength/stability.  Pelvic exam: deferred Toco: negative Fetal well being: 150 bpm, with moderate variability    Consults: None  Significant Findings/ Diagnostic Studies: none  Procedures: NST  Hospital Course: The patient was admitted to Labor and Delivery Triage for observation.   Discharge Condition: good  Disposition: Discharge disposition:  01-Home or Self Care  Diet: Regular diet  Discharge Activity: Activity as tolerated  Discharge Instructions    Diet - low sodium heart healthy   Complete by: As directed    Discharge activity:  No Restrictions   Complete by: As directed    Discharge diet:  No restrictions   Complete by: As directed    Increase activity slowly   Complete by: As directed    No sexual activity restrictions   Complete by: As directed      Allergies as of 06/14/2019      Reactions   Honeysuckle Flower [lonicera] Swelling, Rash, Other (See Comments)   Other Reaction: BREAK OUT, FACIAL SWELLING, WE   Amoxicillin Nausea And Vomiting   Did it involve swelling of the face/tongue/throat, SOB, or low BP? No Did it involve sudden or severe rash/hives, skin peeling, or any reaction on the inside of your mouth or nose? No Did you need to seek medical attention at a hospital or doctor's office? No When did it last happen?Can tolerate but gets an upset stomach If all above answers are "NO", may proceed with cephalosporin use.      Medication List    TAKE these medications   calcium carbonate 500 MG chewable tablet Commonly known as: TUMS - dosed in mg elemental calcium Chew 1 tablet by mouth 2 (two)  times daily as needed for indigestion or heartburn.   cephALEXin 500 MG capsule Commonly known as: KEFLEX Take 1 capsule (500 mg total) by mouth 3 (three) times daily.   CitraNatal Bloom 90-1 MG Tabs Take 1 tablet by mouth daily.   cyclobenzaprine 10 MG tablet Commonly known as: FLEXERIL Take 1 tablet (10 mg total) by mouth 3 (three) times daily as needed for muscle spasms.   folic acid 1 MG tablet Commonly known as: FOLVITE Take 1 tablet (1 mg total) by mouth daily.   ondansetron 8 MG disintegrating tablet Commonly known as: Zofran ODT Take 1 tablet (8 mg total) by mouth every 8 (eight) hours as needed for nausea or vomiting.   pantoprazole 20 MG tablet Commonly known as: Protonix Take 1 tablet (20 mg total) by mouth daily as needed.   predniSONE 10 MG tablet Commonly known as: DELTASONE Take 6 tablets (60 mg total) by mouth daily for 1 day, THEN 5 tablets (50 mg total) daily for 1 day, THEN 4 tablets (40 mg total) daily for 1 day, THEN 3 tablets (30 mg total) daily for 1 day, THEN 2 tablets (20 mg total) daily for 1 day, THEN 1 tablet (10 mg total) daily for 1 day. Start taking on: June 11, 2019   promethazine 12.5 MG tablet Commonly known as: PHENERGAN Take 1 tablet (12.5 mg total) by mouth every 6 (six) hours as needed for nausea or vomiting.      Follow-up Information    [facility] [place]. Go to.   Specialty: Obstetrics and Gynecology Why: regular scheduled prenatal appointment Contact information: [address] [place] [address] [phone]          Total time spent taking care of this patient: 15 minutes  Signed: Tresea Mall, CNM  06/14/2019, 10:47 PM

## 2019-06-17 ENCOUNTER — Other Ambulatory Visit: Payer: Self-pay

## 2019-06-17 ENCOUNTER — Ambulatory Visit (INDEPENDENT_AMBULATORY_CARE_PROVIDER_SITE_OTHER): Payer: 59 | Admitting: Certified Nurse Midwife

## 2019-06-17 VITALS — BP 110/70 | Wt 239.0 lb

## 2019-06-17 DIAGNOSIS — Z3A22 22 weeks gestation of pregnancy: Secondary | ICD-10-CM

## 2019-06-17 DIAGNOSIS — O26892 Other specified pregnancy related conditions, second trimester: Secondary | ICD-10-CM

## 2019-06-17 DIAGNOSIS — O26899 Other specified pregnancy related conditions, unspecified trimester: Secondary | ICD-10-CM

## 2019-06-17 DIAGNOSIS — Z3480 Encounter for supervision of other normal pregnancy, unspecified trimester: Secondary | ICD-10-CM

## 2019-06-17 DIAGNOSIS — Z3482 Encounter for supervision of other normal pregnancy, second trimester: Secondary | ICD-10-CM

## 2019-06-17 DIAGNOSIS — R102 Pelvic and perineal pain: Secondary | ICD-10-CM

## 2019-06-17 LAB — POCT URINALYSIS DIPSTICK OB: Glucose, UA: NEGATIVE

## 2019-06-17 NOTE — Progress Notes (Signed)
C/o pelvic pain; went to hosp for it - states [facility] put bedrest on her paperwork but she doesn't know if she is or isn't.  Was told [facility] would d/w her.

## 2019-06-17 NOTE — Progress Notes (Signed)
ROB at 22wk6d: Was seen in L&D on 3/10 for sacral back pain, 3/19 for lower abdominal pain/ pain in pubic bone area with walking, and 3/22  After falling when her leg "went out on her." She continues to have pelvic pain when getting into bed or when walking. Having trouble working, because of having to walk. 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 SHe hs an appointment for PT next Tuesday. She is really uncomfortable at work, but is wanting to keep her job and is running out of PTO.  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

## 2019-06-18 ENCOUNTER — Telehealth: Payer: Medicaid Other | Admitting: Obstetrics & Gynecology

## 2019-06-21 ENCOUNTER — Telehealth: Payer: Self-pay

## 2019-06-21 ENCOUNTER — Other Ambulatory Visit: Payer: Self-pay

## 2019-06-21 ENCOUNTER — Observation Stay
Admission: EM | Admit: 2019-06-21 | Discharge: 2019-06-21 | Disposition: A | Discharge status: Home / Self Care | Payer: 59 | Attending: Certified Nurse Midwife | Admitting: Certified Nurse Midwife

## 2019-06-21 DIAGNOSIS — N816 Rectocele: Secondary | ICD-10-CM | POA: Diagnosis not present

## 2019-06-21 DIAGNOSIS — O3482 Maternal care for other abnormalities of pelvic organs, second trimester: Principal | ICD-10-CM | POA: Insufficient documentation

## 2019-06-21 DIAGNOSIS — Z3A23 23 weeks gestation of pregnancy: Secondary | ICD-10-CM | POA: Diagnosis not present

## 2019-06-21 NOTE — Discharge Instructions (Signed)
Pelvic Organ Prolapse Pelvic organ prolapse is the stretching, bulging, or dropping of pelvic organs into an abnormal position. It happens when the muscles and tissues that surround and support pelvic structures become weak or stretched. Pelvic organ prolapse can involve the:  Vagina (vaginal prolapse).  Uterus (uterine prolapse).  Bladder (cystocele).  Rectum (rectocele).  Intestines (enterocele). When organs other than the vagina are involved, they often bulge into the vagina or protrude from the vagina, depending on how severe the prolapse is. What are the causes? This condition may be caused by:  Pregnancy, labor, and childbirth.  Past pelvic surgery.  Decreased production of the hormone estrogen associated with menopause.  Consistently lifting more than 50 lb (23 kg).  Obesity.  Long-term inability to pass stool (chronic constipation).  A cough that lasts a long time (chronic).  Buildup of fluid in the abdomen due to certain diseases and other conditions. What are the signs or symptoms? Symptoms of this condition include:  Passing a little urine (loss of bladder control) when you cough, sneeze, strain, and exercise (stress incontinence). This may be worse immediately after childbirth. It may gradually improve over time.  Feeling pressure in your pelvis or vagina. This pressure may increase when you cough or when you are passing stool.  A bulge that protrudes from the opening of your vagina.  Difficulty passing urine or stool.  Pain in your lower back.  Pain, discomfort, or disinterest in sex.  Repeated bladder infections (urinary tract infections).  Difficulty inserting a tampon. In some people, this condition causes no symptoms. How is this diagnosed? This condition may be diagnosed based on a vaginal and rectal exam. During the exam, you may be asked to cough and strain while you are lying down, sitting, and standing up. Your health care provider will  determine if other tests are required, such as bladder function tests. How is this treated? Treatment for this condition may depend on your symptoms. Treatment may include:  Lifestyle changes, such as changes to your diet.  Emptying your bladder at scheduled times (bladder training therapy). This can help reduce or avoid urinary incontinence.  Estrogen. Estrogen may help mild prolapse by increasing the strength and tone of pelvic floor muscles.  Kegel exercises. These may help mild cases of prolapse by strengthening and tightening the muscles of the pelvic floor.  A soft, flexible device that helps support the vaginal walls and keep pelvic organs in place (pessary). This is inserted into your vagina by your health care provider.  Surgery. This is often the only form of treatment for severe prolapse. Follow these instructions at home:  Avoid drinking beverages that contain caffeine or alcohol.  Increase your intake of high-fiber foods. This can help decrease constipation and straining during bowel movements.  Lose weight if recommended by your health care provider.  Wear a sanitary pad or adult diapers if you have urinary incontinence.  Avoid heavy lifting and straining with exercise and work. Do not hold your breath when you perform mild to moderate lifting and exercise activities. Limit your activities as directed by your health care provider.  Do Kegel exercises as directed by your health care provider. To do this: ? Squeeze your pelvic floor muscles tight. You should feel a tight lift in your rectal area and a tightness in your vaginal area. Keep your stomach, buttocks, and legs relaxed. ? Hold the muscles tight for up to 10 seconds. ? Relax your muscles. ? Repeat this exercise 50 times a day,   or as many times as told by your health care provider. Continue to do this exercise for at least 4-6 weeks, or for as long as told by your health care provider.  Take over-the-counter and  prescription medicines only as told by your health care provider.  If you have a pessary, take care of it as told by your health care provider.  Keep all follow-up visits as told by your health care provider. This is important. Contact a health care provider if you:  Have symptoms that interfere with your daily activities or sex life.  Need medicine to help with the discomfort.  Notice bleeding from your vagina that is not related to your period.  Have a fever.  Have pain or bleeding when you urinate.  Have bleeding when you pass stool.  Pass urine when you have sex.  Have chronic constipation.  Have a pessary that falls out.  Have bad smelling vaginal discharge.  Have an unusual, low pain in your abdomen. Summary  Pelvic organ prolapse is the stretching, bulging, or dropping of pelvic organs into an abnormal position. It happens when the muscles and tissues that surround and support pelvic structures become weak or stretched.  When organs other than the vagina are involved, they often bulge into the vagina or protrude from the vagina, depending on how severe the prolapse is.  In most cases, this condition needs to be treated only if it produces symptoms. Treatment may include lifestyle changes, estrogen, Kegel exercises, pessary insertion, or surgery.  Avoid heavy lifting and straining with exercise and work. Do not hold your breath when you perform mild to moderate lifting and exercise activities. Limit your activities as directed by your health care provider. This information is not intended to replace advice given to you by your health care provider. Make sure you discuss any questions you have with your health care provider. Document Revised: 04/02/2017 Document Reviewed: 04/02/2017 Elsevier Patient Education  2020 [employer] Inc[employer]  

## 2019-06-21 NOTE — Telephone Encounter (Signed)
Pt calling; has SPD; hurting really bad over weekend; took warm bath; feels like insides are coming out vagina; can't tell there is an opening there; she pushed on it and felt pelvic right there. [phone].  Adv pt to go to L&D via ED.  Misty notified.

## 2019-06-21 NOTE — OB Triage Note (Signed)
Pt reports taking a hot bath last night and while cleaning her vaginal area she felt swollen tissue that she couldn't quite tell what it was protruding from the vaginal area. She also states that her pubis area is very sore and it is hard to walk comfortably.

## 2019-06-21 NOTE — Final Progress Note (Signed)
Physician Final Progress Note  Patient ID: Isabel Higgins MRN: [id number] DOB/AGE: 06-27-1995 23 y.o.  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 She noticed the same thing this AM when sitting on the toilet. There is no pain in the area, but she feels a lot of pressure. 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 No vaginal bleeding or leakage of water from the vagina History of having a 4460 gm baby vaginally in 2018 at 37.5 weeks Current BMI 37.43 kg/m2 Exam: BP 114/66 (BP Location: Right Arm)   Pulse (!) 103   Temp 97.9 F (36.6 C)   Resp 12   LMP 01/13/2019 (Exact Date)   FHT 150  Pelvic: External: labia minora and vestibule appear erythemaous Vagina: patulous, rectocele present. Worsens with Valsalva.  Wet prep was negative for hyphae, Trich or clue cells  A: Pelvic relaxation at 23.[redacted] weeks gestation-rectocele  P: Discussed diagnosis and advised that treatment during pregnancy is supportive , I.e., if problems with constipation- taking fiber laxative. This problem can be treated surgically once she is done having babies, if symptomatic when she is not pregnant.  Keep PT appointment tomorrow. Follow up at office as scheduled.   Procedures: none  Discharge Condition: stable  Disposition: Discharge disposition: 01-Home or Self Care       Diet: Regular diet  Discharge Activity: Activity as tolerated   Allergies as of 06/21/2019      Reactions   Honeysuckle Flower [lonicera] Swelling, Rash, Other (See Comments)   Other Reaction: BREAK OUT, FACIAL SWELLING, WE   Amoxicillin Nausea And Vomiting   Did it involve swelling of the face/tongue/throat, SOB, or low BP? No Did it involve sudden or severe rash/hives, skin peeling, or any reaction on the inside of your mouth or nose? No Did you need to seek medical attention at a hospital or doctor's office? No When did it last happen?Can tolerate but gets an upset stomach If all above answers are "NO", may proceed with cephalosporin use.      Medication List    TAKE these medications   calcium carbonate 500 MG chewable tablet Commonly known as: TUMS - dosed in mg elemental calcium Chew 1 tablet by mouth 2 (two) times daily as needed for indigestion or heartburn.   cephALEXin 500 MG capsule Commonly known as: KEFLEX Take 1 capsule (500 mg total) by mouth 3 (three) times daily.   CitraNatal Bloom 90-1 MG Tabs Take 1 tablet by mouth daily.   cyclobenzaprine 10 MG tablet Commonly known as: FLEXERIL Take 1 tablet (10 mg total) by mouth 3 (three) times daily as needed for muscle spasms.   folic acid 1 MG tablet Commonly known as: FOLVITE Take 1 tablet (1 mg total) by mouth daily.   ondansetron 8 MG disintegrating tablet Commonly known as: Zofran ODT Take 1 tablet (8 mg total) by mouth every 8 (eight) hours as needed for nausea or vomiting.   pantoprazole 20 MG tablet Commonly known as: Protonix Take 1 tablet (20 mg total) by mouth daily as needed.  promethazine 12.5 MG tablet Commonly known as: PHENERGAN Take 1 tablet (12.5 mg total) by mouth every 6 (six) hours as needed for nausea or vomiting.        Total time spent taking care of this patient: 15 minutes  Signed: Farrel Conners 06/21/2019, 1:05 PM

## 2019-06-22 ENCOUNTER — Ambulatory Visit: Payer: 59 | Attending: Obstetrics and Gynecology | Admitting: Physical Therapy

## 2019-06-22 ENCOUNTER — Encounter: Payer: Self-pay | Admitting: Physical Therapy

## 2019-06-22 DIAGNOSIS — R102 Pelvic and perineal pain: Secondary | ICD-10-CM | POA: Insufficient documentation

## 2019-06-22 DIAGNOSIS — R293 Abnormal posture: Secondary | ICD-10-CM | POA: Diagnosis present

## 2019-06-22 DIAGNOSIS — M6281 Muscle weakness (generalized): Secondary | ICD-10-CM | POA: Insufficient documentation

## 2019-06-22 NOTE — Therapy (Signed)
[facility] [address] [place], [place], [address] Phone: [phone]   Fax:  [phone]  Physical Therapy Evaluation  Patient Details  Name: Isabel Higgins MRN: [id number] Date of Birth: 02-17-1996 Referring Provider (PT): Vena Austria   Encounter Date: 06/22/2019  PT End of Session - 06/22/19 1258    Visit Number  1    Number of Visits  13    Date for PT Re-Evaluation  09/14/19    Authorization Type  CAID    Authorization - Visit Number  1    Authorization - Number of Visits  4    PT Start Time  1300    PT Stop Time  1355    PT Time Calculation (min)  55 min    Activity Tolerance  Patient tolerated treatment well;Patient limited by pain    Behavior During Therapy  [facility] for tasks assessed/performed       Past Medical History:  Diagnosis Date  . Bradycardia   . Depression   . GERD (gastroesophageal reflux disease)   . Menometrorrhagia 08/07/2016  . Mental disorder   . Psoriasis     Past Surgical History:  Procedure Laterality Date  . INNER EAR SURGERY     right  . LAPAROSCOPY N/A 11/19/2018   Procedure: LAPAROSCOPY DIAGNOSTIC AND BIOPSY;  Surgeon: Nadara Mustard, MD;  Location: [facility] ORS;  Service: Gynecology;  Laterality: N/A;  . SHOULDER SURGERY     right    There were no vitals filed for this visit.     [facility] PT Assessment - 06/22/19 0001      Assessment   Medical Diagnosis  Pregnancy related back pain    Referring Provider (PT)  Vena Austria    Onset Date/Surgical Date  04/24/19    Hand Dominance  Right    Next MD Visit  07/16/2019    Prior Therapy  No      Balance Screen   Has the patient fallen in the past 6 months  Yes    How many times?  1        PELVIC HEALTH PHYSICAL THERAPY EVALUATION  SCREENING Red Flags: None Have you had any night sweats? Unexplained weight loss? Saddle anesthesia? Unexplained changes in bowel or bladder habits?  Precautions:  Fall  SUBJECTIVE  Chief Complaint: Patient notes increased pain at the pubic symphysis, abdomen, and low back. Patient notes about 8 weeks ago having popping at the the pubic symphysis with bed mobility. Patient notes this made it very difficult to walk. Patient notes this increased over the next weeks. Patient notes she can't get into bed without help. She is having increased hip pain and SIJ pain. Patient notes decreased tolerance to standing (< 10 min), walking (at onset). Patient notes soreness and at pubic symphysis. Patient notes having to assist your legs to move 2/2 pain at pubic symphysis.   Pertinent History:  Falls Positive.  Scoliosis Negative. Pulmonary disease/dysfunction Negative. Surgical history: Negative.   Recent Procedures/Tests/Findings: None  Obstetrical History: G2P1; 23 weeks Deliveries: Vaginal; pushed for 1 hour Tearing/Episiotomy: None Birthing position: Back  Gynecological History: Hysterectomy: No Vaginal/Abdominal Endometriosis: Positive for adhesions removed laprascopically (10/2018)  Pain with exam: Yes ; pain with insertion of speculum   Urinary History: Incontinence: Positive for inconsistent. Triggers: urgency, coughing/laughing/sneezing Amount: Min Fluid Intake: 3-4 12 oz H20, 2-4 juices/sodas Nocturia: 1-2x/night Frequency of urination: every 1-2 hours Pain with urination: Negative Difficulty initiating urination: Negative Frequent UTI: Positive.  Gastrointestinal History: [place] Stool Chart: Type 1-3 Frequency of BMs: 3-4x/week Pain with defecation: Positive Straining with defecation: Positive; splinting to empty Incontinence: Negative. Typical food intake: patient reports good vegetable and fruit intake  Sexual activity/pain: Pain with intercourse: Positive. Pain does occur rarely with external sexual activity; consistently with internal.  Initial penetration: Yes  Deep thrustingYes   Location of pain: RLQ, back, pubic  symphysis Current pain:  3-4 /10 (sitting) Max pain: 10 /10 (walking) Least pain: 3-4 /10 Pain quality: pain quality: aching, cramping and dull Radiating pain: Yes to inner thigh; back pain occasionally down back of BLE with standing >10 minutes.   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  Recent memory is intact.  Remote memory is intact.  Attention span and concentration are intact.  Expressive speech is intact.  Patient's fund of knowledge is within normal limits for educational level.  POSTURE/OBSERVATIONS:  Lumbar lordosis: WNL Iliac crest height:R elevated Lumbar lateral shift: negative Pelvic obliquity: R slightly anterior  GAIT: Wide based commensurate with stage of pregnancy; decreased stance time on R.  RANGE OF MOTION:    LEFT RIGHT  Lumbar forward flexion (65):  WNL    Lumbar extension (30): WNL    Lumbar lateral flexion (25):  WFL >L  Thoracic and Lumbar rotation (30 degrees):    WNL WNL    SENSATION: Grossly intact to light touch bilateral LEs as determined by testing dermatomes L2-S2 Proprioception and hot/cold testing deferred on this date  STRENGTH: MMT   RLE LLE  Hip Flexion 4 3+  Hip Extension 3+ 3+  Hip Abduction * 3 3  Hip Adduction * 3 3  Knee Extension 5 5  Knee Flexion 5 5  Dorsiflexion  5 5  Plantarflexion (seated) 5 5   ABDOMINAL:  Palpation: TTP over pubic symphysis Diastasis: Positive (commensurate with pregnancy) Rib flare: none noted  SPECIAL TESTS: Distraction (RW43): R: Positive L: Positive Compression (SN/SP 69): R: Negative L: Negative Stork/March (SP 93): R: Positive L: Positive  PHYSICAL PERFORMANCE MEASURES: STS: LLE  bias, no UE use, pain with transfer  EXTERNAL PELVIC EXAM: deferred 2/2 to time constraints Palpation: Breath coordination: Cued Lengthen: Cued Contraction: Cough:  INTERNAL VAGINAL EXAM: deferred 2/2 to time constraints Introitus Appears:  Skin integrity:  Scar mobility: Strength (PERF):  Symmetry: Palpation: Prolapse:   INTERNAL RECTAL EXAM: deferred 2/2 to time constraints Strength (PERF): Symmetry: Palpation: Prolapse:   OUTCOME MEASURES: FOTO (45)   ASSESSMENT Patient is a 24 year old presenting to clinic with chief complaints of pelvic pressure, pelvic pain, low back pain, and constipation. Upon examination, patient demonstrates deficits in pain, lumbosacral mobility/stability, posture, balance, PFM coordination/strength, and BLE strength as evidenced by 10/10 pain with walking, + March test B, + Distraction test, elevated R IC, increased mobility of pubic symphysis, and LLE 3+/5 gross strength, RLE 4/5 gross strength. Patient's responses on FOTO outcome measures (45) indicate moderate functional limitations/disability/distress. Patient's progress may be limited due to the progressive nature of pregnancy and resources; however, patient's motivation is advantageous. Patient was able to achieve basic understanding of PFM functions, toileting posture, and kegel supported transfers during today's evaluation and responded positively to educational interventions.  Patient will benefit from continued skilled therapeutic intervention to address deficits in pain, lumbosacral mobility/stability, posture, balance, PFM coordination/strength, and BLE strength in order to increase function, and improve overall QOL.  EDUCATION Patient educated on prognosis, POC, and provided with HEP including: toileting posture; PFM contraction with transfers. Patient articulated understanding and returned demonstration. Patient will benefit from further education in order to maximize compliance and  understanding for long-term therapeutic gains.  TREATMENT Neuromuscular Re-education: Patient educated on primary functions of the pelvic floor including: posture/balance, sexual pleasure, storage and elimination of waste from the body, abdominal cavity closure, and breath coordination. Patient educated on toileting posture and bowel retraining in order to better regulate bowel through behavioral changes. Seated PFM contraction with exhalation for improved stabilization of pelvis and pelvic organ support    Objective measurements completed on examination: See above findings.        PT Long Term Goals - 06/22/19 1453      PT LONG TERM GOAL #1   Title  Patient will demonstrate independence with HEP in order to maximize therapeutic gains and improve carryover from physical therapy sessions to ADLs in the home and community.    Baseline  IE: not demonstrated    Time  12    Period  Weeks    Status  New    Target Date  09/14/19      PT LONG TERM GOAL #2   Title  Patient will decrease worst pain as reported on NPRS by at least 2 points to demonstrate clinically significant reduction in pain in order to restore/improve function and overall QOL.    Baseline  IE: 10/10    Time  12    Period  Weeks    Status  New    Target Date  09/14/19      PT LONG TERM GOAL #3   Title  Patient will demonstrate improved toileting posture with knees higher than hips and feet supported, and will report straining <2x/week with bowel movements in order to decrease incidence of constipation/hemorrhoids/mismanagement of intra-abdominal pressure and improve overall QOL.    Baseline  IE: 3+x/week    Time  12    Period  Weeks    Status  New    Target Date  09/14/19      PT LONG TERM GOAL #4   Title  Patient will demonstrate palpable (external) PFM contraction for > 5 seconds for 5 repetitions and cued relaxation of the PFM coordinated with breath for improved management of intra-abdominal pressure and  normal bowel function and POP management without the presence of pain in order to improve participation at home and in the community.    Baseline  IE: not demonstrated    Time  12    Period  Weeks    Status  New    Target Date  09/14/19      PT LONG TERM GOAL #5   Title  Patient will demonstrate improved function as evidenced by a score of 52 on FOTO measure for full participation in activities at home and in the community.    Baseline  IE: 45    Time  12    Period  Weeks    Status  New    Target Date  09/14/19             Plan - 06/22/19 1259    Clinical Impression Statement  Patient is a 24 year old presenting to clinic with chief complaints of pelvic pressure,  pelvic pain, low back pain, and constipation. Upon examination, patient demonstrates deficits in pain, lumbosacral mobility/stability, posture, balance, PFM coordination/strength, and BLE strength as evidenced by 10/10 pain with walking, + March test B, + Distraction test, elevated R IC, increased mobility of pubic symphysis, and LLE 3+/5 gross strength, RLE 4/5 gross strength. Patient's responses on FOTO outcome measures (45) indicate moderate functional limitations/disability/distress. Patient's progress may be limited due to the progressive nature of pregnancy and resources; however, patient's motivation is advantageous. Patient was able to achieve basic understanding of PFM functions, toileting posture, and kegel supported transfers during today's evaluation and responded positively to educational interventions. Patient will benefit from continued skilled therapeutic intervention to address deficits in pain, lumbosacral mobility/stability, posture, balance, PFM coordination/strength, and BLE strength in order to increase function, and improve overall QOL.    Personal Factors and Comorbidities  Age;Education;Sex;Time since onset of injury/illness/exacerbation;Fitness;Past/Current Experience;Comorbidity 3+;Behavior Pattern     Comorbidities  POTS, depression, GERD, rectocele during pregnancy    Examination-Activity Limitations  Sit;Transfers;Sleep;Bed Mobility;Lift;Squat;Stairs;Stand;Caring for Others;Public relations account executive;Reach Overhead;Continence;Bathing    Examination-Participation Restrictions  Yard Work;Interpersonal Relationship;Personal Finances;Cleaning;Laundry;Community Activity;Driving    Stability/Clinical Decision Making  Evolving/Moderate complexity    Clinical Decision Making  Moderate    Rehab Potential  Fair    PT Frequency  1x / week    PT Duration  12 weeks    PT Treatment/Interventions  Cryotherapy;Moist Heat;Ultrasound;Manual techniques;Passive range of motion;Therapeutic activities;Neuromuscular re-education;Balance training;Therapeutic exercise;Patient/family education;Taping;Scar mobilization;Joint Manipulations;Gait training;Compression bandaging    PT Next Visit Plan  Manual PRN; stabilization    PT Home Exercise Plan  toileting posture; knack with transfers; wear pregnancy support belt    Consulted and Agree with Plan of Care  Patient       Patient will benefit from skilled therapeutic intervention in order to improve the following deficits and impairments:  Abnormal gait, Decreased balance, Decreased endurance, Decreased mobility, Difficulty walking, Improper body mechanics, Pain, Postural dysfunction, Decreased range of motion, Decreased coordination, Decreased strength, Hypermobility, Decreased activity tolerance  Visit Diagnosis: Pelvic pain  Abnormal posture  Muscle weakness (generalized)     Problem List Patient Active Problem List   Diagnosis Date Noted  . Rectocele affecting obstetric care in second trimester 06/21/2019  . Back pain affecting pregnancy in third trimester 06/02/2019  . BMI 35.0-35.9,adult 03/29/2019  . History of gestational hypertension 02/09/2019  . Maternal obesity, antepartum 02/09/2019  . Supervision of other normal pregnancy, antepartum 02/09/2019   . Endosalpingiosis 11/25/2018  . Dysmenorrhea 11/13/2018  . Pelvic pain 07/14/2018  . Abnormal uterine bleeding 07/14/2018  . POTS (postural orthostatic tachycardia syndrome) 03/06/2012   Sheria Lang PT, DPT [phone] 06/22/2019, 2:55 PM  [facility] [address] [place], [place], [address] Phone: [phone]   Fax:  [phone]  Name: TABITHIA STRODER MRN: [id number] Date of Birth: Sep 28, 1995

## 2019-06-30 ENCOUNTER — Telehealth: Payer: Self-pay

## 2019-06-30 ENCOUNTER — Ambulatory Visit: Payer: 59 | Attending: Obstetrics and Gynecology | Admitting: Physical Therapy

## 2019-06-30 ENCOUNTER — Other Ambulatory Visit: Payer: Self-pay

## 2019-06-30 ENCOUNTER — Encounter: Payer: Self-pay | Admitting: Physical Therapy

## 2019-06-30 DIAGNOSIS — R293 Abnormal posture: Secondary | ICD-10-CM | POA: Insufficient documentation

## 2019-06-30 DIAGNOSIS — M6281 Muscle weakness (generalized): Secondary | ICD-10-CM | POA: Diagnosis present

## 2019-06-30 DIAGNOSIS — R102 Pelvic and perineal pain: Secondary | ICD-10-CM | POA: Insufficient documentation

## 2019-06-30 NOTE — Telephone Encounter (Signed)
Called the patient- she did not answer. Left message. Okay for her to get vaccine.

## 2019-06-30 NOTE — Therapy (Signed)
[facility] [address] [place], [place], [address] Phone: [phone]   Fax:  [phone]  Physical Therapy Treatment  Patient Details  Name: Isabel Higgins MRN: [id number] Date of Birth: 10/04/1995 Referring Provider (PT): Malachy Mood   Encounter Date: 06/30/2019  PT End of Session - 06/30/19 1708    Visit Number  2    Number of Visits  13    Date for PT Re-Evaluation  09/14/19    Authorization Type  CAID    Authorization - Visit Number  1    Authorization - Number of Visits  4    PT Start Time  1700    PT Stop Time  3662    PT Time Calculation (min)  55 min    Activity Tolerance  Patient tolerated treatment well;Patient limited by pain    Behavior During Therapy  [facility] for tasks assessed/performed       Past Medical History:  Diagnosis Date  . Bradycardia   . Depression   . GERD (gastroesophageal reflux disease)   . Menometrorrhagia 08/07/2016  . Mental disorder   . Psoriasis     Past Surgical History:  Procedure Laterality Date  . INNER EAR SURGERY     right  . LAPAROSCOPY N/A 11/19/2018   Procedure: LAPAROSCOPY DIAGNOSTIC AND BIOPSY;  Surgeon: Gae Dry, MD;  Location: [facility] ORS;  Service: Gynecology;  Laterality: N/A;  . SHOULDER SURGERY     right    There were no vitals filed for this visit.  Subjective Assessment - 06/30/19 1706    Subjective  Patient presents to clinic with maternity brace on. She notes increased swelling in ankles 2/2 to heat. She denies any specific concerns at this time.    Currently in Pain?  Yes    Pain Score  4     Pain Location  Pelvis    Pain Orientation  Anterior      TREATMENT  Manual Therapy: STM and TPR performed to B lumbar and gluteals to allow for decreased tension and pain and improved posture and function with vibratory and percussive instrument  Neuromuscular Re-education: Pelvic MET with coordinated breath for decreased pain and improved force closure  of pubic symphysis Hip adduction squeeze for improved force closure of pubic symphysis Patient education on POP and conservative management options including: PFM strengthening, IAP management, and pessary placement.  Patient educated throughout session on appropriate technique and form using multi-modal cueing, HEP, and activity modification. Patient articulated understanding and returned demonstration.  Patient Response to interventions: 3/10 pain at end of session; lightheadedness with pelvic MET increased reps  ASSESSMENT Patient presents to clinic with excellent motivation to participate in therapy. Patient demonstrates deficits in pain, lumbosacral mobility/stability, posture, balance, PFM coordination/strength, and BLE strength. Patient able to achieve appropriate form for pelvic MET correction of R pelvic obliquity during today's session and responded positively to educational and manual interventions. Patient will benefit from continued skilled therapeutic intervention to address remaining deficits in pain, lumbosacral mobility/stability, posture, balance, PFM coordination/strength, and BLE strength in order to decrease progression of POP during pregnancy, increase function, and improve overall QOL.    PT Long Term Goals - 06/22/19 1453      PT LONG TERM GOAL #1   Title  Patient will demonstrate independence with HEP in order to maximize therapeutic gains and improve carryover from physical therapy sessions to ADLs in the home and community.    Baseline  IE: not demonstrated  Time  12    Period  Weeks    Status  New    Target Date  09/14/19      PT LONG TERM GOAL #2   Title  Patient will decrease worst pain as reported on NPRS by at least 2 points to demonstrate clinically significant reduction in pain in order to restore/improve function and overall QOL.    Baseline  IE: 10/10    Time  12    Period  Weeks    Status  New    Target Date  09/14/19      PT LONG TERM GOAL #3    Title  Patient will demonstrate improved toileting posture with knees higher than hips and feet supported, and will report straining <2x/week with bowel movements in order to decrease incidence of constipation/hemorrhoids/mismanagement of intra-abdominal pressure and improve overall QOL.    Baseline  IE: 3+x/week    Time  12    Period  Weeks    Status  New    Target Date  09/14/19      PT LONG TERM GOAL #4   Title  Patient will demonstrate palpable (external) PFM contraction for > 5 seconds for 5 repetitions and cued relaxation of the PFM coordinated with breath for improved management of intra-abdominal pressure and normal bowel function and POP management without the presence of pain in order to improve participation at home and in the community.    Baseline  IE: not demonstrated    Time  12    Period  Weeks    Status  New    Target Date  09/14/19      PT LONG TERM GOAL #5   Title  Patient will demonstrate improved function as evidenced by a score of 52 on FOTO measure for full participation in activities at home and in the community.    Baseline  IE: 45    Time  12    Period  Weeks    Status  New    Target Date  09/14/19            Plan - 06/30/19 1708    Clinical Impression Statement  Patient presents to clinic with excellent motivation to participate in therapy. Patient demonstrates deficits in pain, lumbosacral mobility/stability, posture, balance, PFM coordination/strength, and BLE strength. Patient able to achieve appropriate form for pelvic MET correction of R pelvic obliquity during today's session and responded positively to educational and manual interventions. Patient will benefit from continued skilled therapeutic intervention to address remaining deficits in pain, lumbosacral mobility/stability, posture, balance, PFM coordination/strength, and BLE strength in order to decrease progression of POP during pregnancy, increase function, and improve overall QOL.    Personal  Factors and Comorbidities  Age;Education;Sex;Time since onset of injury/illness/exacerbation;Fitness;Past/Current Experience;Comorbidity 3+;Behavior Pattern    Comorbidities  POTS, depression, GERD, rectocele during pregnancy    Examination-Activity Limitations  Sit;Transfers;Sleep;Bed Mobility;Lift;Squat;Stairs;Stand;Caring for Others;Adult nurse;Reach Overhead;Continence;Bathing    Examination-Participation Restrictions  Yard Work;Interpersonal Relationship;Personal Finances;Cleaning;Laundry;Community Activity;Driving    Stability/Clinical Decision Making  Evolving/Moderate complexity    Rehab Potential  Fair    PT Frequency  1x / week    PT Duration  12 weeks    PT Treatment/Interventions  Cryotherapy;Moist Heat;Ultrasound;Manual techniques;Passive range of motion;Therapeutic activities;Neuromuscular re-education;Balance training;Therapeutic exercise;Patient/family education;Taping;Scar mobilization;Joint Manipulations;Gait training;Compression bandaging    PT Next Visit Plan  Manual PRN; stabilization    PT Home Exercise Plan  toileting posture; knack with transfers; wear pregnancy support belt    Consulted and Agree  with Plan of Care  Patient       Patient will benefit from skilled therapeutic intervention in order to improve the following deficits and impairments:  Abnormal gait, Decreased balance, Decreased endurance, Decreased mobility, Difficulty walking, Improper body mechanics, Pain, Postural dysfunction, Decreased range of motion, Decreased coordination, Decreased strength, Hypermobility, Decreased activity tolerance  Visit Diagnosis: Pelvic pain  Abnormal posture  Muscle weakness (generalized)     Problem List Patient Active Problem List   Diagnosis Date Noted  . Rectocele affecting obstetric care in second trimester 06/21/2019  . Back pain affecting pregnancy in third trimester 06/02/2019  . BMI 35.0-35.9,adult 03/29/2019  . History of gestational  hypertension 02/09/2019  . Maternal obesity, antepartum 02/09/2019  . Supervision of other normal pregnancy, antepartum 02/09/2019  . Endosalpingiosis 11/25/2018  . Dysmenorrhea 11/13/2018  . Pelvic pain 07/14/2018  . Abnormal uterine bleeding 07/14/2018  . POTS (postural orthostatic tachycardia syndrome) 03/06/2012   Myles Gip PT, DPT [phone] 06/30/2019, 6:15 PM  [place] [facility] [address] [place], [place], [address] Phone: [phone]   Fax:  [phone]  Name: Isabel Higgins MRN: [id number] Date of Birth: 1995-09-07

## 2019-06-30 NOTE — Telephone Encounter (Signed)
Pt would like a DR to call her, she is planning on getting the Covid vaccine Saturday, Wants someone to go over the risk with her.

## 2019-07-01 ENCOUNTER — Ambulatory Visit (INDEPENDENT_AMBULATORY_CARE_PROVIDER_SITE_OTHER): Payer: 59 | Admitting: Certified Nurse Midwife

## 2019-07-01 DIAGNOSIS — O99210 Obesity complicating pregnancy, unspecified trimester: Secondary | ICD-10-CM

## 2019-07-01 DIAGNOSIS — O99212 Obesity complicating pregnancy, second trimester: Secondary | ICD-10-CM

## 2019-07-01 DIAGNOSIS — O09292 Supervision of pregnancy with other poor reproductive or obstetric history, second trimester: Secondary | ICD-10-CM

## 2019-07-01 DIAGNOSIS — Z131 Encounter for screening for diabetes mellitus: Secondary | ICD-10-CM

## 2019-07-01 DIAGNOSIS — O09892 Supervision of other high risk pregnancies, second trimester: Secondary | ICD-10-CM

## 2019-07-01 DIAGNOSIS — Z3A24 24 weeks gestation of pregnancy: Secondary | ICD-10-CM

## 2019-07-01 DIAGNOSIS — O0992 Supervision of high risk pregnancy, unspecified, second trimester: Secondary | ICD-10-CM

## 2019-07-01 NOTE — Progress Notes (Signed)
Routine Prenatal Care Visit- Virtual Visit  Subjective   Virtual Visit via Telephone Note  I connected with@ on 07/01/19 at  4:10 PM EDT by telephone and verified that I am speaking with the correct person using two identifiers.   I discussed the limitations, risks, security and privacy concerns of performing an evaluation and management service by telephone and the availability of in person appointments. I also discussed with the patient that there may be a patient responsible charge related to this service. The patient expressed understanding and agreed to proceed.  The patient was at home I spoke with the patient from my  workstation The names of people involved in this encounter were: CMA Ariyana, and the patient, and myself .   Isabel Higgins is a 24 y.o. G2P1001 at [redacted]w[redacted]d receiving ongoing prenatal care.  She is currently monitored for the following issues for this high-risk pregnancy and has POTS (postural orthostatic tachycardia syndrome); Pelvic pain; Abnormal uterine bleeding; Dysmenorrhea; Endosalpingiosis; History of gestational hypertension; Maternal obesity, antepartum; Supervision of other normal pregnancy, antepartum; BMI 35.0-35.9,adult; Back pain affecting pregnancy in third trimester; and Rectocele affecting obstetric care in second trimester on their problem list.  ----------------------------------------------------------------------------------- Patient reports that she has been seen in physical therapy twice and at her last visit was started on some exercises for her back pain Feeling fetal movement, some days more than others. Has anterior placenta. Was seen recently in L&D for feeling something bulging at her vaginal opening. She has a rectocele.  ----------------------------------------------------------------------------------- The following portions of the patient's history were reviewed and updated as appropriate: allergies, current medications, past family  history, past medical history, past social history, past surgical history and problem list. Problem list updated.   Objective  Last menstrual period 01/13/2019, currently breastfeeding. Pregravid weight 231 lb (104.8 kg) Total Weight Gain 8 lb (3.629 kg)  Physical Exam could not be performed. Because of the COVID-19 outbreak this visit was performed over the phone and not in person.   Assessment   24 y.o. G2P1001 at [redacted]w[redacted]d by  10/15/2019, by Ultrasound presenting for routine prenatal visit over the phone  Plan   Pregnancy#2 Problems (from 01/13/19 to present)    Problem Noted Resolved   BMI 35.0-35.9,adult 03/29/2019 by Conard Novak, MD No   History of gestational hypertension 02/09/2019 by Vena Austria, MD No   Maternal obesity, antepartum 02/09/2019 by Vena Austria, MD No   Supervision of other normal pregnancy, antepartum 02/09/2019 by Vena Austria, MD No   Overview Addendum 05/21/2019  4:41 PM by Nadara Mustard, MD    [facility] Prenatal Labs  Dating 8 week [place] on 03/05/19 Blood type: A/Positive/-- (11/17 1008)   Genetic Screen     NIPS:nml XY Antibody:Negative (11/17 1008)  Anatomic [place] [facility] nml Rubella: 1.20 (11/17 1008) Varicella: Immune  GTT Early: 129 Third trimester:  RPR: Non Reactive (11/17 1008)   Rhogam n/a HBsAg: Negative (11/17 1008)   TDaP vaccine                       Flu Shot:1/29 HIV: Non Reactive (11/17 1008)   Baby Food                                GBS:   Contraception  Pap:08/2018  CBB  No   CS/VBAC n/a   Support Person  Follow Up Instructions: Continue with PT visits for back pain Growth scan and 28 week labs at next visit in 2-3 weeks   I discussed the assessment and treatment plan with the patient. The patient was provided an opportunity to ask questions and all were answered. The patient agreed with the plan and demonstrated an understanding of the instructions.    I provided 12 minutes of non-face-to-face  time during this encounter.  Dalia Heading, [facility], [facility] 07/01/2019 5:35 PM ROB C/o prolapse, would like to speak about what Pt stated  Denies lof, no vb, FM not as much

## 2019-07-02 ENCOUNTER — Telehealth: Payer: Self-pay | Admitting: Certified Nurse Midwife

## 2019-07-02 NOTE — Telephone Encounter (Signed)
Tried to call pt, I amended the FMLA and refaxed and made new copies for her to pick up!

## 2019-07-02 NOTE — Telephone Encounter (Signed)
Patient is calling with Questions about her FMLA Forms. The says it say that a letter from the provider is suppose to be attached and that she is currently she a physical therapist and it's noted on her FMLA forms that she is not. Would you please advise patient. Thank you

## 2019-07-03 ENCOUNTER — Ambulatory Visit: Payer: 59 | Attending: Internal Medicine

## 2019-07-03 DIAGNOSIS — Z23 Encounter for immunization: Secondary | ICD-10-CM

## 2019-07-03 NOTE — Progress Notes (Signed)
   Covid-19 Vaccination Clinic  Name:  Isabel Higgins    MRN: [id number] DOB: [birth year]  07/03/2019  [name] was observed post Covid-19 immunization for 15 minutes without incident. She was provided with Vaccine Information Sheet and instruction to access the V-Safe system.   [name] was instructed to call 911 with any severe reactions post vaccine: Marland Kitchen Difficulty breathing  . Swelling of face and throat  . A fast heartbeat  . A bad rash all over body  . Dizziness and weakness   Immunizations Administered    Name Date Dose VIS Date Route   Pfizer COVID-19 Vaccine 07/03/2019  8:43 AM 0.3 mL 03/05/2019 Intramuscular   Manufacturer: [employer], [employer]   Lot: [id number]   NDC: [address]-0

## 2019-07-07 ENCOUNTER — Ambulatory Visit: Payer: 59 | Admitting: Physical Therapy

## 2019-07-07 ENCOUNTER — Other Ambulatory Visit: Payer: Self-pay

## 2019-07-07 DIAGNOSIS — R102 Pelvic and perineal pain: Secondary | ICD-10-CM

## 2019-07-07 DIAGNOSIS — R293 Abnormal posture: Secondary | ICD-10-CM

## 2019-07-07 DIAGNOSIS — M6281 Muscle weakness (generalized): Secondary | ICD-10-CM

## 2019-07-07 NOTE — Therapy (Signed)
[facility] [address] [place], [place], [address] Phone: [phone]   Fax:  [phone]  Physical Therapy Treatment  Patient Details  Name: Isabel Higgins MRN: [id number] Date of Birth: [birth year] Referring Provider (PT): Vena Austria   Encounter Date: 07/07/2019  PT End of Session - 07/07/19 1727    Visit Number  3    Number of Visits  13    Date for PT Re-Evaluation  09/14/19    Authorization Type  CAID    Authorization - Visit Number  3    Authorization - Number of Visits  4    PT Start Time  1700    PT Stop Time  1755    PT Time Calculation (min)  55 min    Activity Tolerance  Patient tolerated treatment well;Patient limited by pain    Behavior During Therapy  [facility] for tasks assessed/performed       Past Medical History:  Diagnosis Date  . Bradycardia   . Depression   . GERD (gastroesophageal reflux disease)   . Menometrorrhagia 08/07/2016  . Mental disorder   . Psoriasis     Past Surgical History:  Procedure Laterality Date  . INNER EAR SURGERY     right  . LAPAROSCOPY N/A 11/19/2018   Procedure: LAPAROSCOPY DIAGNOSTIC AND BIOPSY;  Surgeon: Nadara Mustard, MD;  Location: [facility] ORS;  Service: Gynecology;  Laterality: N/A;  . SHOULDER SURGERY     right    There were no vitals filed for this visit.  Subjective Assessment - 07/07/19 1716    Subjective  Patient presents to clinic with concerns for continued ability to feel tissue at the vaginal opening. Patient reports that she has intense pain at night with bed mobility. She is unable to find a comfortable position to sleep in. Patient does note that her pubic symphysis is tender to touch and with movement. She has 3/10 pain in sitting and 8/10 pain in standing/walking.    Currently in Pain?  Yes    Pain Score  3     Pain Location  Pelvis    Pain Orientation  Anterior       TREATMENT  Manual Therapy: STM and TPR performed to B lumbar and gluteals to  allow for decreased tension and pain and improved posture and function with vibratory and percussive instrument  Neuromuscular Re-education: Quadruped diaphragmatic breathing, VCs and TCs to decrease compensation Quadruped PFM activation with coordinated breath to help support POP and modulate pain at pubic symphysis Kinesiotape for pubic symphysis pain and round ligament pain. Patient educated on purpose and removal with s/s for skin irritation.    Patient educated throughout session on appropriate technique and form using multi-modal cueing, HEP, and activity modification. Patient articulated understanding and returned demonstration.  Patient Response to interventions: Patient notes improved comfort with ktape.  ASSESSMENT Patient presents to clinic with excellent motivation to participate in therapy. Patient demonstrates deficits in pain, lumbosacral mobility/stability, posture, balance, PFM coordination/strength, and BLE strength. Patient able to achieve rectocele relief in quadruped position during today's session and responded positively to educational and manual interventions. Patient will benefit from continued skilled therapeutic intervention to address remaining deficits in pain, lumbosacral mobility/stability, posture, balance, PFM coordination/strength, and BLE strength in order to decrease progression of POP during pregnancy, increase function, and improve overall QOL.    PT Long Term Goals - 06/22/19 1453      PT LONG TERM GOAL #1   Title  Patient will demonstrate independence with HEP in order to maximize therapeutic gains and improve carryover from physical therapy sessions to ADLs in the home and community.    Baseline  IE: not demonstrated    Time  12    Period  Weeks    Status  New    Target Date  09/14/19      PT LONG TERM GOAL #2   Title  Patient will decrease worst pain as reported on NPRS by at least 2 points to demonstrate clinically significant reduction in pain  in order to restore/improve function and overall QOL.    Baseline  IE: 10/10    Time  12    Period  Weeks    Status  New    Target Date  09/14/19      PT LONG TERM GOAL #3   Title  Patient will demonstrate improved toileting posture with knees higher than hips and feet supported, and will report straining <2x/week with bowel movements in order to decrease incidence of constipation/hemorrhoids/mismanagement of intra-abdominal pressure and improve overall QOL.    Baseline  IE: 3+x/week    Time  12    Period  Weeks    Status  New    Target Date  09/14/19      PT LONG TERM GOAL #4   Title  Patient will demonstrate palpable (external) PFM contraction for > 5 seconds for 5 repetitions and cued relaxation of the PFM coordinated with breath for improved management of intra-abdominal pressure and normal bowel function and POP management without the presence of pain in order to improve participation at home and in the community.    Baseline  IE: not demonstrated    Time  12    Period  Weeks    Status  New    Target Date  09/14/19      PT LONG TERM GOAL #5   Title  Patient will demonstrate improved function as evidenced by a score of 52 on FOTO measure for full participation in activities at home and in the community.    Baseline  IE: 45    Time  12    Period  Weeks    Status  New    Target Date  09/14/19            Plan - 07/07/19 1728    Clinical Impression Statement  Patient presents to clinic with excellent motivation to participate in therapy. Patient demonstrates deficits in pain, lumbosacral mobility/stability, posture, balance, PFM coordination/strength, and BLE strength. Patient able to achieve rectocele relief in quadruped position during today's session and responded positively to educational and manual interventions. Patient will benefit from continued skilled therapeutic intervention to address remaining deficits in pain, lumbosacral mobility/stability, posture, balance, PFM  coordination/strength, and BLE strength in order to decrease progression of POP during pregnancy, increase function, and improve overall QOL.    Personal Factors and Comorbidities  Age;Education;Sex;Time since onset of injury/illness/exacerbation;Fitness;Past/Current Experience;Comorbidity 3+;Behavior Pattern    Comorbidities  POTS, depression, GERD, rectocele during pregnancy    Examination-Activity Limitations  Sit;Transfers;Sleep;Bed Mobility;Lift;Squat;Stairs;Stand;Caring for Others;Adult nurse;Reach Overhead;Continence;Bathing    Examination-Participation Restrictions  Yard Work;Interpersonal Relationship;Personal Finances;Cleaning;Laundry;Community Activity;Driving    Stability/Clinical Decision Making  Evolving/Moderate complexity    Rehab Potential  Fair    PT Frequency  1x / week    PT Duration  12 weeks    PT Treatment/Interventions  Cryotherapy;Moist Heat;Ultrasound;Manual techniques;Passive range of motion;Therapeutic activities;Neuromuscular re-education;Balance training;Therapeutic exercise;Patient/family education;Taping;Scar mobilization;Joint Manipulations;Gait training;Compression bandaging  PT Next Visit Plan  Manual PRN; stabilization    PT Home Exercise Plan  toileting posture; knack with transfers; wear pregnancy support belt    Consulted and Agree with Plan of Care  Patient       Patient will benefit from skilled therapeutic intervention in order to improve the following deficits and impairments:  Abnormal gait, Decreased balance, Decreased endurance, Decreased mobility, Difficulty walking, Improper body mechanics, Pain, Postural dysfunction, Decreased range of motion, Decreased coordination, Decreased strength, Hypermobility, Decreased activity tolerance  Visit Diagnosis: Pelvic pain  Abnormal posture  Muscle weakness (generalized)     Problem List Patient Active Problem List   Diagnosis Date Noted  . Rectocele affecting obstetric care in second  trimester 06/21/2019  . Back pain affecting pregnancy in third trimester 06/02/2019  . BMI 35.0-35.9,adult 03/29/2019  . History of gestational hypertension 02/09/2019  . Maternal obesity, antepartum 02/09/2019  . Supervision of other normal pregnancy, antepartum 02/09/2019  . Endosalpingiosis 11/25/2018  . Dysmenorrhea 11/13/2018  . Pelvic pain 07/14/2018  . Abnormal uterine bleeding 07/14/2018  . POTS (postural orthostatic tachycardia syndrome) 03/06/2012   Sheria Lang PT, DPT [phone] 07/07/2019, 6:46 PM  [facility] [address] [place], [place], [address] Phone: [phone]   Fax:  [phone]  Name: ATALIA LITZINGER MRN: [id number] Date of Birth: 27-Feb-1996

## 2019-07-14 ENCOUNTER — Other Ambulatory Visit: Payer: Self-pay

## 2019-07-14 ENCOUNTER — Encounter: Payer: Self-pay | Admitting: Physical Therapy

## 2019-07-14 ENCOUNTER — Ambulatory Visit: Payer: 59 | Admitting: Physical Therapy

## 2019-07-14 DIAGNOSIS — R102 Pelvic and perineal pain: Secondary | ICD-10-CM | POA: Diagnosis not present

## 2019-07-14 DIAGNOSIS — M6281 Muscle weakness (generalized): Secondary | ICD-10-CM

## 2019-07-14 DIAGNOSIS — R293 Abnormal posture: Secondary | ICD-10-CM

## 2019-07-14 NOTE — Therapy (Signed)
[facility] [address] [place], [place], [address] Phone: [phone]   Fax:  [phone]  Physical Therapy Treatment  Patient Details  Name: Isabel Higgins MRN: [id number] Date of Birth: 05/26/1995 Referring Provider (PT): Vena Austria   Encounter Date: 07/14/2019  PT End of Session - 07/14/19 1714    Visit Number  4    Number of Visits  13    Date for PT Re-Evaluation  09/14/19    Authorization Type  CAID    Authorization - Visit Number  4    Authorization - Number of Visits  4    PT Start Time  1700    PT Stop Time  1755    PT Time Calculation (min)  55 min    Activity Tolerance  Patient tolerated treatment well;Patient limited by pain    Behavior During Therapy  [facility] for tasks assessed/performed       Past Medical History:  Diagnosis Date  . Bradycardia   . Depression   . GERD (gastroesophageal reflux disease)   . Menometrorrhagia 08/07/2016  . Mental disorder   . Psoriasis     Past Surgical History:  Procedure Laterality Date  . INNER EAR SURGERY     right  . LAPAROSCOPY N/A 11/19/2018   Procedure: LAPAROSCOPY DIAGNOSTIC AND BIOPSY;  Surgeon: Nadara Mustard, MD;  Location: [facility] ORS;  Service: Gynecology;  Laterality: N/A;  . SHOULDER SURGERY     right    There were no vitals filed for this visit.  Subjective Assessment - 07/14/19 1703    Subjective  Patient presents to clinic with high pain intensity in all positions. Patient notes improved pelvic pain, but has higher more consistent pain at the pubic symphysis and at the base of the pelvis. Patient has pain with standing/walking, sitting, and is unable to sleep in her bed.    Currently in Pain?  Yes    Pain Score  9     Pain Location  Pelvis    Pain Orientation  Anterior;Medial      TREATMENT  Neuromuscular Re-education: Quadruped diaphragmatic breathing, VCs and TCs to decrease compensation Quadruped PFM activation with coordinated breath to  help support POP and modulate pain at pubic symphysis Quadruped TrA activation with coordinated breathing, VCs and TCs to decrease compensation Seated pelvic tilts on washcloth roll for POP support and improved postural awareness/control Patient education on POP support garments including: V-brace/Prenatal Cradle and pessary. Patient educated on risks associated with increased straining with BMs. Patient encouraged to reach out to OB/GYN for guidance on rectocele management with progression of pregnancy in light of 71 point decrease in bowel function and 17 point increase in pain since evaluation.   Patient educated throughout session on appropriate technique and form using multi-modal cueing, HEP, and activity modification. Patient articulated understanding and returned demonstration.  Patient Response to interventions: Patient notes comfort of quadruped position and beneficial feedback provided by washcloth roll.  ASSESSMENT Patient presents to clinic with excellent motivation to participate in therapy. Patient demonstrates deficits in pain, lumbosacral mobility/stability, posture, balance, PFM coordination/strength, and BLE strength. Patient able to achieve minimal-moderate rectocele relief in quadruped position and with firm tactile cueing via washcloth roll during today's session and responded positively to educational and active interventions. Patient's condition has limited potential to improve in response to therapy without adjunctive supportive measures for rectocele prolapse during pregnancy. Maximum improvement is yet to be obtained. The anticipated improvement is attainable with adjunctive  supportive measures of POP and reasonable in a generally predictable time. Patient will benefit from continued skilled therapeutic intervention to address remaining deficits in pain, lumbosacral mobility/stability, posture, balance, PFM coordination/strength, and BLE strength in order to decrease  progression of POP during pregnancy, increase function, and improve overall QOL.    PT Long Term Goals - 07/14/19 1720      PT LONG TERM GOAL #1   Title  Patient will demonstrate independence with HEP in order to maximize therapeutic gains and improve carryover from physical therapy sessions to ADLs in the home and community.    Baseline  IE: not demonstrated;  4/21: IND    Time  12    Period  Weeks    Status  Achieved      PT LONG TERM GOAL #2   Title  Patient will decrease worst pain as reported on NPRS by at least 2 points to demonstrate clinically significant reduction in pain in order to restore/improve function and overall QOL.    Baseline  IE: 10/10; 4/21: 10/10    Time  12    Period  Weeks    Status  On-going    Target Date  09/14/19      PT LONG TERM GOAL #3   Title  Patient will demonstrate improved toileting posture with knees higher than hips and feet supported, and will report straining <2x/week with bowel movements in order to decrease incidence of constipation/hemorrhoids/mismanagement of intra-abdominal pressure and improve overall QOL.    Baseline  IE: 3+x/week; 4/21: straining with every BM with squatty potty    Time  12    Period  Weeks    Status  On-going    Target Date  09/14/19      PT LONG TERM GOAL #4   Title  Patient will demonstrate palpable (external) PFM contraction for > 5 seconds for 5 repetitions and cued relaxation of the PFM coordinated with breath for improved management of intra-abdominal pressure and normal bowel function and POP management without the presence of pain in order to improve participation at home and in the community.    Baseline  IE: not demonstrated; 3/87: 3 quick flicks in quadruped position    Time  12    Period  Weeks    Status  On-going    Target Date  09/14/19      PT LONG TERM GOAL #5   Title  Patient will demonstrate improved function as evidenced by a score of 52 on FOTO measure for full participation in activities at  home and in the community.    Baseline  IE: 45; 4/21: 45 (Bowel), 88 (PFDI Prolapse a 38 point worsening), 71 (PFDI Bowel 71 point worsening), 92 (PFDI Pain 17 point worsening)    Time  12    Period  Weeks    Status  On-going    Target Date  09/14/19            Plan - 07/14/19 1714    Clinical Impression Statement  Patient presents to clinic with excellent motivation to participate in therapy. Patient demonstrates deficits in pain, lumbosacral mobility/stability, posture, balance, PFM coordination/strength, and BLE strength. Patient able to achieve minimal-moderate rectocele relief in quadruped position and with firm tactile cueing via washcloth roll during today's session and responded positively to educational and active interventions. Patient's condition has limited potential to improve in response to therapy without adjunctive supportive measures for rectocele prolapse during pregnancy. Maximum improvement is yet to be obtained.  The anticipated improvement is attainable with adjunctive supportive measures of POP and reasonable in a generally predictable time. Patient will benefit from continued skilled therapeutic intervention to address remaining deficits in pain, lumbosacral mobility/stability, posture, balance, PFM coordination/strength, and BLE strength in order to decrease progression of POP during pregnancy, increase function, and improve overall QOL.    Personal Factors and Comorbidities  Age;Education;Sex;Time since onset of injury/illness/exacerbation;Fitness;Past/Current Experience;Comorbidity 3+;Behavior Pattern    Comorbidities  POTS, depression, GERD, rectocele during pregnancy    Examination-Activity Limitations  Sit;Transfers;Sleep;Bed Mobility;Lift;Squat;Stairs;Stand;Caring for Others;Public relations account executive;Reach Overhead;Continence;Bathing    Examination-Participation Restrictions  Yard Work;Interpersonal Relationship;Personal Finances;Cleaning;Laundry;Community  Activity;Driving    Stability/Clinical Decision Making  Evolving/Moderate complexity    Rehab Potential  Fair    PT Frequency  1x / week    PT Duration  12 weeks    PT Treatment/Interventions  Cryotherapy;Moist Heat;Ultrasound;Manual techniques;Passive range of motion;Therapeutic activities;Neuromuscular re-education;Balance training;Therapeutic exercise;Patient/family education;Taping;Scar mobilization;Joint Manipulations;Gait training;Compression bandaging    PT Next Visit Plan  Manual PRN; stabilization    PT Home Exercise Plan  toileting posture; knack with transfers; wear pregnancy support belt    Consulted and Agree with Plan of Care  Patient       Patient will benefit from skilled therapeutic intervention in order to improve the following deficits and impairments:  Abnormal gait, Decreased balance, Decreased endurance, Decreased mobility, Difficulty walking, Improper body mechanics, Pain, Postural dysfunction, Decreased range of motion, Decreased coordination, Decreased strength, Hypermobility, Decreased activity tolerance  Visit Diagnosis: Pelvic pain  Abnormal posture  Muscle weakness (generalized)     Problem List Patient Active Problem List   Diagnosis Date Noted  . Rectocele affecting obstetric care in second trimester 06/21/2019  . Back pain affecting pregnancy in third trimester 06/02/2019  . BMI 35.0-35.9,adult 03/29/2019  . History of gestational hypertension 02/09/2019  . Maternal obesity, antepartum 02/09/2019  . Supervision of other normal pregnancy, antepartum 02/09/2019  . Endosalpingiosis 11/25/2018  . Dysmenorrhea 11/13/2018  . Pelvic pain 07/14/2018  . Abnormal uterine bleeding 07/14/2018  . POTS (postural orthostatic tachycardia syndrome) 03/06/2012   Sheria Lang PT, DPT [phone] 07/14/2019, 6:28 PM  [place] [facility] [address]. [place], [place], [address] Phone: [phone]   Fax:  [phone]  Name:  MARGOT ORIORDAN MRN: [id number] Date of Birth: March 23, 1996

## 2019-07-16 ENCOUNTER — Encounter: Payer: Medicaid Other | Admitting: Obstetrics and Gynecology

## 2019-07-16 ENCOUNTER — Other Ambulatory Visit: Payer: Medicaid Other

## 2019-07-21 ENCOUNTER — Encounter: Payer: Self-pay | Admitting: Physical Therapy

## 2019-07-21 ENCOUNTER — Other Ambulatory Visit: Payer: Self-pay

## 2019-07-21 ENCOUNTER — Ambulatory Visit: Payer: 59 | Admitting: Physical Therapy

## 2019-07-21 DIAGNOSIS — M6281 Muscle weakness (generalized): Secondary | ICD-10-CM

## 2019-07-21 DIAGNOSIS — R102 Pelvic and perineal pain: Secondary | ICD-10-CM

## 2019-07-21 DIAGNOSIS — R293 Abnormal posture: Secondary | ICD-10-CM

## 2019-07-21 NOTE — Therapy (Signed)
[place] [facility] [address] [place], [place], [address] Phone: [phone]   Fax:  [phone]  Physical Therapy Treatment  Patient Details  Name: Isabel Higgins MRN: [id number] Date of Birth: 13-Jan-1996 Referring Provider (PT): Malachy Mood   Encounter Date: 07/21/2019  PT End of Session - 07/21/19 1719    Visit Number  5    Number of Visits  13    Date for PT Re-Evaluation  09/14/19    Authorization Type  CAID    Authorization - Visit Number  1    Authorization - Number of Visits  8    PT Start Time  1703    PT Stop Time  1756    PT Time Calculation (min)  53 min    Activity Tolerance  Patient tolerated treatment well;Patient limited by pain    Behavior During Therapy  [facility] for tasks assessed/performed       Past Medical History:  Diagnosis Date  . Bradycardia   . Depression   . GERD (gastroesophageal reflux disease)   . Menometrorrhagia 08/07/2016  . Mental disorder   . Psoriasis     Past Surgical History:  Procedure Laterality Date  . INNER EAR SURGERY     right  . LAPAROSCOPY N/A 11/19/2018   Procedure: LAPAROSCOPY DIAGNOSTIC AND BIOPSY;  Surgeon: Gae Dry, MD;  Location: [facility] ORS;  Service: Gynecology;  Laterality: N/A;  . SHOULDER SURGERY     right    There were no vitals filed for this visit.  Subjective Assessment - 07/21/19 1708    Subjective  Patient notes that she has occasional deep sensation of tearing and is concerned with the combination of pressure that she might be having more than rectocele. Patient is also having radiating pain to the anterior R thigh and inner thigh to R labial folds.    Currently in Pain?  Yes    Pain Score  7        TREATMENT  Neuromuscular Re-education: Quadruped diaphragmatic breathing, VCs and TCs to decrease compensation Quadruped PFM activation with coordinated breath to help support POP and modulate pain at pubic symphysis Quadruped TrA activation with  coordinated breathing, VCs and TCs to decrease compensation Quadruped PFM activation with glute set with coordinated breathing, VCs and TCs to decrease compensation Patient education on risks associated with pushing/vaginal delivery with the presence of POP.  Patient educated throughout session on appropriate technique and form using multi-modal cueing, HEP, and activity modification. Patient articulated understanding and returned demonstration.  Patient Response to interventions: Patient notes comfort of quadruped position and beneficial feedback provided by washcloth roll.  ASSESSMENT Patient presents to clinic with excellent motivation to participate in therapy. Patient demonstrates deficits in pain, lumbosacral mobility/stability, posture, balance, PFM coordination/strength, and BLE strength. Patient demonstrating good PFM activation in quadruped/gravity eliminated position during today's session and responded positively to educational and active interventions. Patient will benefit from continued skilled therapeutic intervention to address remaining deficits in pain, lumbosacral mobility/stability, posture, balance, PFM coordination/strength, and BLE strength in order to decrease progression of POP during pregnancy, increase function, and improve overall QOL.     PT Long Term Goals - 07/14/19 1720      PT LONG TERM GOAL #1   Title  Patient will demonstrate independence with HEP in order to maximize therapeutic gains and improve carryover from physical therapy sessions to ADLs in the home and community.    Baseline  IE: not demonstrated;  4/21:  IND    Time  12    Period  Weeks    Status  Achieved      PT LONG TERM GOAL #2   Title  Patient will decrease worst pain as reported on NPRS by at least 2 points to demonstrate clinically significant reduction in pain in order to restore/improve function and overall QOL.    Baseline  IE: 10/10; 4/21: 10/10    Time  12    Period  Weeks    Status   On-going    Target Date  09/14/19      PT LONG TERM GOAL #3   Title  Patient will demonstrate improved toileting posture with knees higher than hips and feet supported, and will report straining <2x/week with bowel movements in order to decrease incidence of constipation/hemorrhoids/mismanagement of intra-abdominal pressure and improve overall QOL.    Baseline  IE: 3+x/week; 4/21: straining with every BM with squatty potty    Time  12    Period  Weeks    Status  On-going    Target Date  09/14/19      PT LONG TERM GOAL #4   Title  Patient will demonstrate palpable (external) PFM contraction for > 5 seconds for 5 repetitions and cued relaxation of the PFM coordinated with breath for improved management of intra-abdominal pressure and normal bowel function and POP management without the presence of pain in order to improve participation at home and in the community.    Baseline  IE: not demonstrated; 4/21: 3 quick flicks in quadruped position    Time  12    Period  Weeks    Status  On-going    Target Date  09/14/19      PT LONG TERM GOAL #5   Title  Patient will demonstrate improved function as evidenced by a score of 52 on FOTO measure for full participation in activities at home and in the community.    Baseline  IE: 45; 4/21: 45 (Bowel), 88 (PFDI Prolapse a 38 point worsening), 71 (PFDI Bowel 71 point worsening), 92 (PFDI Pain 17 point worsening)    Time  12    Period  Weeks    Status  On-going    Target Date  09/14/19            Plan - 07/21/19 1724    Clinical Impression Statement  Patient presents to clinic with excellent motivation to participate in therapy. Patient demonstrates deficits in pain, lumbosacral mobility/stability, posture, balance, PFM coordination/strength, and BLE strength. Patient demonstrating good PFM activation in quadruped/gravity eliminated position during today's session and responded positively to educational and active interventions. Patient will  benefit from continued skilled therapeutic intervention to address remaining deficits in pain, lumbosacral mobility/stability, posture, balance, PFM coordination/strength, and BLE strength in order to decrease progression of POP during pregnancy, increase function, and improve overall QOL.    Personal Factors and Comorbidities  Age;Education;Sex;Time since onset of injury/illness/exacerbation;Fitness;Past/Current Experience;Comorbidity 3+;Behavior Pattern    Comorbidities  POTS, depression, GERD, rectocele during pregnancy    Examination-Activity Limitations  Sit;Transfers;Sleep;Bed Mobility;Lift;Squat;Stairs;Stand;Caring for Others;Public relations account executive;Reach Overhead;Continence;Bathing    Examination-Participation Restrictions  Yard Work;Interpersonal Relationship;Personal Finances;Cleaning;Laundry;Community Activity;Driving    Stability/Clinical Decision Making  Evolving/Moderate complexity    Rehab Potential  Fair    PT Frequency  1x / week    PT Duration  12 weeks    PT Treatment/Interventions  Cryotherapy;Moist Heat;Ultrasound;Manual techniques;Passive range of motion;Therapeutic activities;Neuromuscular re-education;Balance training;Therapeutic exercise;Patient/family education;Taping;Scar mobilization;Joint Manipulations;Gait training;Compression bandaging  PT Next Visit Plan  Manual PRN; stabilization    PT Home Exercise Plan  toileting posture; knack with transfers; wear pregnancy support belt    Consulted and Agree with Plan of Care  Patient       Patient will benefit from skilled therapeutic intervention in order to improve the following deficits and impairments:  Abnormal gait, Decreased balance, Decreased endurance, Decreased mobility, Difficulty walking, Improper body mechanics, Pain, Postural dysfunction, Decreased range of motion, Decreased coordination, Decreased strength, Hypermobility, Decreased activity tolerance  Visit Diagnosis: Pelvic pain  Abnormal posture  Muscle  weakness (generalized)     Problem List Patient Active Problem List   Diagnosis Date Noted  . Rectocele affecting obstetric care in second trimester 06/21/2019  . Back pain affecting pregnancy in third trimester 06/02/2019  . BMI 35.0-35.9,adult 03/29/2019  . History of gestational hypertension 02/09/2019  . Maternal obesity, antepartum 02/09/2019  . Supervision of other normal pregnancy, antepartum 02/09/2019  . Endosalpingiosis 11/25/2018  . Dysmenorrhea 11/13/2018  . Pelvic pain 07/14/2018  . Abnormal uterine bleeding 07/14/2018  . POTS (postural orthostatic tachycardia syndrome) 03/06/2012   Sheria Lang PT, DPT [phone] 07/21/2019, 6:18 PM  [place] [facility] [address] [place], [place], [address] Phone: [phone]   Fax:  [phone]  Name: Isabel Higgins MRN: [id number] Date of Birth: 02/15/1996

## 2019-07-23 ENCOUNTER — Other Ambulatory Visit: Payer: Self-pay

## 2019-07-23 ENCOUNTER — Other Ambulatory Visit: Payer: 59

## 2019-07-23 ENCOUNTER — Encounter: Payer: Medicaid Other | Admitting: Obstetrics and Gynecology

## 2019-07-23 ENCOUNTER — Ambulatory Visit (INDEPENDENT_AMBULATORY_CARE_PROVIDER_SITE_OTHER): Payer: 59

## 2019-07-23 DIAGNOSIS — O0992 Supervision of high risk pregnancy, unspecified, second trimester: Secondary | ICD-10-CM | POA: Diagnosis not present

## 2019-07-23 DIAGNOSIS — O09292 Supervision of pregnancy with other poor reproductive or obstetric history, second trimester: Secondary | ICD-10-CM

## 2019-07-23 DIAGNOSIS — Z131 Encounter for screening for diabetes mellitus: Secondary | ICD-10-CM

## 2019-07-23 DIAGNOSIS — Z3A24 24 weeks gestation of pregnancy: Secondary | ICD-10-CM

## 2019-07-23 DIAGNOSIS — O99212 Obesity complicating pregnancy, second trimester: Secondary | ICD-10-CM

## 2019-07-23 DIAGNOSIS — O99210 Obesity complicating pregnancy, unspecified trimester: Secondary | ICD-10-CM

## 2019-07-24 LAB — 28 WEEK RH+PANEL
Basophils Absolute: 0 10*3/uL (ref 0.0–0.2)
Basos: 0 %
EOS (ABSOLUTE): 0.1 10*3/uL (ref 0.0–0.4)
Eos: 0 %
Gestational Diabetes Screen: 139 mg/dL (ref 65–139)
HIV Screen 4th Generation wRfx: NONREACTIVE
Hematocrit: 38.2 % (ref 34.0–46.6)
Hemoglobin: 12.7 g/dL (ref 11.1–15.9)
Immature Grans (Abs): 0.1 10*3/uL (ref 0.0–0.1)
Immature Granulocytes: 1 %
Lymphocytes Absolute: 2.2 10*3/uL (ref 0.7–3.1)
Lymphs: 18 %
MCH: 30.2 pg (ref 26.6–33.0)
MCHC: 33.2 g/dL (ref 31.5–35.7)
MCV: 91 fL (ref 79–97)
Monocytes Absolute: 0.5 10*3/uL (ref 0.1–0.9)
Monocytes: 4 %
Neutrophils Absolute: 9.5 10*3/uL — ABNORMAL HIGH (ref 1.4–7.0)
Neutrophils: 77 %
Platelets: 187 10*3/uL (ref 150–450)
RBC: 4.2 x10E6/uL (ref 3.77–5.28)
RDW: 13.2 % (ref 11.7–15.4)
RPR Ser Ql: NONREACTIVE
WBC: 12.3 10*3/uL — ABNORMAL HIGH (ref 3.4–10.8)

## 2019-07-26 ENCOUNTER — Ambulatory Visit (INDEPENDENT_AMBULATORY_CARE_PROVIDER_SITE_OTHER): Payer: Medicaid Other | Admitting: Certified Nurse Midwife

## 2019-07-26 ENCOUNTER — Encounter: Payer: Self-pay | Admitting: Certified Nurse Midwife

## 2019-07-26 ENCOUNTER — Other Ambulatory Visit: Payer: Self-pay

## 2019-07-26 VITALS — BP 124/80 | Wt 248.0 lb

## 2019-07-26 DIAGNOSIS — O99213 Obesity complicating pregnancy, third trimester: Secondary | ICD-10-CM

## 2019-07-26 DIAGNOSIS — O99210 Obesity complicating pregnancy, unspecified trimester: Secondary | ICD-10-CM

## 2019-07-26 DIAGNOSIS — Z3483 Encounter for supervision of other normal pregnancy, third trimester: Secondary | ICD-10-CM

## 2019-07-26 DIAGNOSIS — Z3A28 28 weeks gestation of pregnancy: Secondary | ICD-10-CM

## 2019-07-26 LAB — POCT URINALYSIS DIPSTICK OB
Glucose, UA: NEGATIVE
POC,PROTEIN,UA: NEGATIVE

## 2019-07-26 NOTE — Progress Notes (Signed)
Growth scan on Friday. No vb. No lof. Pt has had a lot of pressure and sharp pains. Wants cervical check just to make sure everything is okay.

## 2019-07-26 NOTE — Telephone Encounter (Signed)
Talked with patient at her appointment today. She no longer requires this.Farrel Conners, CNM

## 2019-07-26 NOTE — Progress Notes (Signed)
HROB at [facility]: Baby moving. Occasional contraction/tightening. Complains of increasing pelvic pressure and occasional sharp pains in the vaginal area. Desires cervical check.  Last baby was almost 10# at 37 wk 5 days (induced for gestational hypertension). Continues on her pelvic PT.  HAd ultrasound on 30 April: EFW 3# (69.7%) with AC in the 86th%. AFI was 19.3cm. Frank breech presentation 1 hour GTT on 30 April was 139. Hemoglobin 12.7 gm/dl FH today 43OO and FHT 875 Cervical exam today: L/C/OOP  A: IUP at [facility]>D No evidence of preterm dilation Borderline 1 hour GTT with hx of LGA baby  P: DIscussed decreasing CHO/ sweets in diet: portion control for pasta, and making most of the CHO in her diet complex CHO Repeat growth scan in 4 weeks Maternity support belt recommended. ROB in 2 weeks  Farrel Conners, CNM

## 2019-07-28 ENCOUNTER — Other Ambulatory Visit: Payer: Self-pay

## 2019-07-28 ENCOUNTER — Encounter: Payer: Medicaid Other | Admitting: Obstetrics and Gynecology

## 2019-07-28 ENCOUNTER — Ambulatory Visit: Payer: 59 | Attending: Internal Medicine

## 2019-07-28 ENCOUNTER — Encounter: Payer: Self-pay | Admitting: Physical Therapy

## 2019-07-28 ENCOUNTER — Ambulatory Visit: Payer: 59 | Attending: Obstetrics and Gynecology | Admitting: Physical Therapy

## 2019-07-28 DIAGNOSIS — Z23 Encounter for immunization: Secondary | ICD-10-CM

## 2019-07-28 DIAGNOSIS — R293 Abnormal posture: Secondary | ICD-10-CM | POA: Diagnosis present

## 2019-07-28 DIAGNOSIS — M6281 Muscle weakness (generalized): Secondary | ICD-10-CM | POA: Diagnosis present

## 2019-07-28 DIAGNOSIS — R102 Pelvic and perineal pain: Secondary | ICD-10-CM | POA: Diagnosis present

## 2019-07-28 NOTE — Therapy (Signed)
[place] [facility] [address] [place], [place], [address] Phone: [phone]   Fax:  [phone]  Physical Therapy Treatment  Patient Details  Name: Isabel Higgins MRN: [id number] Date of Birth: 1995/07/01 Referring Provider (PT): Vena Austria   Encounter Date: 07/28/2019  PT End of Session - 07/28/19 1458    Visit Number  6    Number of Visits  13    Date for PT Re-Evaluation  09/14/19    Authorization Type  CAID    Authorization - Visit Number  2    Authorization - Number of Visits  8    PT Start Time  1454    PT Stop Time  1548    PT Time Calculation (min)  54 min    Activity Tolerance  Patient tolerated treatment well    Behavior During Therapy  [facility] for tasks assessed/performed       Past Medical History:  Diagnosis Date  . Bradycardia   . Depression   . GERD (gastroesophageal reflux disease)   . Menometrorrhagia 08/07/2016  . Mental disorder   . Psoriasis     Past Surgical History:  Procedure Laterality Date  . INNER EAR SURGERY     right  . LAPAROSCOPY N/A 11/19/2018   Procedure: LAPAROSCOPY DIAGNOSTIC AND BIOPSY;  Surgeon: Nadara Mustard, MD;  Location: [facility] ORS;  Service: Gynecology;  Laterality: N/A;  . SHOULDER SURGERY     right    There were no vitals filed for this visit.  Subjective Assessment - 07/28/19 1458    Subjective  Patient reports that she is feeling much better and notes that she has been doing her exercises. She states that she isn't sure what changed but she is not in as much pain. She states that she feels like she is doing everything she is supposed to do and this is a good day. She adds that all days are not good days, but things have improved.    Currently in Pain?  Yes    Pain Score  3     Pain Location  Pelvis       TREATMENT  Neuromuscular Re-education: Quadruped diaphragmatic breathing, VCs and TCs to decrease compensation Quadruped PFM activation with coordinated breath to  help support POP and modulate pain at pubic symphysis Quadruped TrA activation with coordinated breathing, VCs and TCs to decrease compensation Quadruped PFM activation with glute set with coordinated breathing, VCs and TCs to decrease compensation Patient education on birthing positions and strategies for decreased risk of progression of POP with vaginal delivery. Patient education on concepts for pelvic degrees of freedom during labor and delivery.  Patient educated throughout session on appropriate technique and form using multi-modal cueing, HEP, and activity modification. Patient articulated understanding and returned demonstration.  Patient Response to interventions: Patient notes comfort with continuing current HEP.  ASSESSMENT Patient presents to clinic with excellent motivation to participate in therapy. Patient demonstrates deficits in pain, lumbosacral mobility/stability, posture, balance, PFM coordination/strength, and BLE strength. Patient demonstrating improvement with respect to PFM strength goals during today's session and responded positively to educational and active interventions. Patient will benefit from continued skilled therapeutic intervention to address remaining deficits in pain, lumbosacral mobility/stability, posture, balance, PFM coordination/strength, and BLE strength in order to decrease progression of POP during pregnancy, increase function, and improve overall QOL.    PT Long Term Goals - 07/28/19 1528      PT LONG TERM GOAL #1   Title  Patient will demonstrate independence with HEP in order to maximize therapeutic gains and improve carryover from physical therapy sessions to ADLs in the home and community.    Baseline  IE: not demonstrated;  4/21: IND    Time  12    Period  Weeks    Status  Achieved    Target Date  09/14/19      PT LONG TERM GOAL #2   Title  Patient will decrease worst pain as reported on NPRS by at least 2 points to demonstrate clinically  significant reduction in pain in order to restore/improve function and overall QOL.    Baseline  IE: 10/10; 4/21: 10/10; 5/5: 5-6/10    Time  12    Period  Weeks    Status  On-going    Target Date  09/14/19      PT LONG TERM GOAL #3   Title  Patient will demonstrate improved toileting posture with knees higher than hips and feet supported, and will report straining <2x/week with bowel movements in order to decrease incidence of constipation/hemorrhoids/mismanagement of intra-abdominal pressure and improve overall QOL.    Baseline  IE: 3+x/week; 4/21: straining with every BM with squatty potty; 5/5: toilet posture achieved, manual assistance via splinting    Time  12    Period  Weeks    Status  On-going    Target Date  09/14/19      PT LONG TERM GOAL #4   Title  Patient will demonstrate palpable (external) PFM contraction for > 5 seconds for 5 repetitions and cued relaxation of the PFM coordinated with breath for improved management of intra-abdominal pressure and normal bowel function and POP management without the presence of pain in order to improve participation at home and in the community.    Baseline  IE: not demonstrated; 1/77: 3 quick flicks in quadruped position; 5/5: 5x 3 sec with even lift and lower.    Time  12    Period  Weeks    Status  On-going    Target Date  09/14/19      PT LONG TERM GOAL #5   Title  Patient will demonstrate improved function as evidenced by a score of 52 on FOTO measure for full participation in activities at home and in the community.    Baseline  IE: 45; 4/21: 45 (Bowel), 88 (PFDI Prolapse a 38 point worsening), 71 (PFDI Bowel 71 point worsening), 92 (PFDI Pain 17 point worsening)    Time  12    Period  Weeks    Status  On-going    Target Date  09/14/19            Plan - 07/28/19 1500    Clinical Impression Statement  Patient presents to clinic with excellent motivation to participate in therapy. Patient demonstrates deficits in pain,  lumbosacral mobility/stability, posture, balance, PFM coordination/strength, and BLE strength. Patient demonstrating improvement with respect to PFM strength goals during today's session and responded positively to educational and active interventions. Patient will benefit from continued skilled therapeutic intervention to address remaining deficits in pain, lumbosacral mobility/stability, posture, balance, PFM coordination/strength, and BLE strength in order to decrease progression of POP during pregnancy, increase function, and improve overall QOL.    Personal Factors and Comorbidities  Age;Education;Sex;Time since onset of injury/illness/exacerbation;Fitness;Past/Current Experience;Comorbidity 3+;Behavior Pattern    Comorbidities  POTS, depression, GERD, rectocele during pregnancy    Examination-Activity Limitations  Sit;Transfers;Sleep;Bed Mobility;Lift;Squat;Stairs;Stand;Caring for Others;Adult nurse;Reach Overhead;Continence;Bathing    Examination-Participation Restrictions  Yard Work;Interpersonal Relationship;Personal Finances;Cleaning;Laundry;Community Activity;Driving    Stability/Clinical Decision Making  Evolving/Moderate complexity    Rehab Potential  Fair    PT Frequency  1x / week    PT Duration  12 weeks    PT Treatment/Interventions  Cryotherapy;Moist Heat;Ultrasound;Manual techniques;Passive range of motion;Therapeutic activities;Neuromuscular re-education;Balance training;Therapeutic exercise;Patient/family education;Taping;Scar mobilization;Joint Manipulations;Gait training;Compression bandaging    PT Next Visit Plan  Manual PRN; stabilization    PT Home Exercise Plan  toileting posture; knack with transfers; wear pregnancy support belt    Consulted and Agree with Plan of Care  Patient       Patient will benefit from skilled therapeutic intervention in order to improve the following deficits and impairments:  Abnormal gait, Decreased balance, Decreased endurance,  Decreased mobility, Difficulty walking, Improper body mechanics, Pain, Postural dysfunction, Decreased range of motion, Decreased coordination, Decreased strength, Hypermobility, Decreased activity tolerance  Visit Diagnosis: Pelvic pain  Abnormal posture  Muscle weakness (generalized)     Problem List Patient Active Problem List   Diagnosis Date Noted  . Rectocele affecting obstetric care in second trimester 06/21/2019  . Back pain affecting pregnancy in third trimester 06/02/2019  . BMI 35.0-35.9,adult 03/29/2019  . History of gestational hypertension 02/09/2019  . Maternal obesity, antepartum 02/09/2019  . Supervision of other normal pregnancy, antepartum 02/09/2019  . Endosalpingiosis 11/25/2018  . Dysmenorrhea 11/13/2018  . Pelvic pain 07/14/2018  . Abnormal uterine bleeding 07/14/2018  . POTS (postural orthostatic tachycardia syndrome) 03/06/2012   Sheria Lang PT, DPT [phone] 07/28/2019, 4:02 PM  [place] [facility] [address] [place], [place], [address] Phone: [phone]   Fax:  [phone]  Name: Isabel Higgins MRN: [id number] Date of Birth: Jun 29, 1995

## 2019-07-28 NOTE — Progress Notes (Signed)
   Covid-19 Vaccination Clinic  Name:  Isabel Higgins    MRN: [id number] DOB: 12/24/1995  07/28/2019  [name] was observed post Covid-19 immunization for 15 minutes without incident. She was provided with Vaccine Information Sheet and instruction to access the V-Safe system.   [name] was instructed to call 911 with any severe reactions post vaccine: Marland Kitchen Difficulty breathing  . Swelling of face and throat  . A fast heartbeat  . A bad rash all over body  . Dizziness and weakness   Immunizations Administered    Name Date Dose VIS Date Route   Pfizer COVID-19 Vaccine 07/28/2019  4:49 PM 0.3 mL 05/19/2018 Intramuscular   Manufacturer: [employer], [employer]   Lot: [id number]   NDC: [address]-1

## 2019-08-04 ENCOUNTER — Encounter: Payer: 59 | Admitting: Physical Therapy

## 2019-08-06 ENCOUNTER — Encounter: Payer: Self-pay | Admitting: Certified Nurse Midwife

## 2019-08-09 ENCOUNTER — Encounter: Payer: Self-pay | Admitting: Obstetrics and Gynecology

## 2019-08-09 ENCOUNTER — Ambulatory Visit (INDEPENDENT_AMBULATORY_CARE_PROVIDER_SITE_OTHER): Payer: 59 | Admitting: Obstetrics and Gynecology

## 2019-08-09 ENCOUNTER — Other Ambulatory Visit: Payer: Self-pay

## 2019-08-09 VITALS — BP 122/74 | Wt 252.0 lb

## 2019-08-09 DIAGNOSIS — O0993 Supervision of high risk pregnancy, unspecified, third trimester: Secondary | ICD-10-CM

## 2019-08-09 DIAGNOSIS — O99210 Obesity complicating pregnancy, unspecified trimester: Secondary | ICD-10-CM

## 2019-08-09 DIAGNOSIS — Z3A30 30 weeks gestation of pregnancy: Secondary | ICD-10-CM

## 2019-08-09 DIAGNOSIS — Z8759 Personal history of other complications of pregnancy, childbirth and the puerperium: Secondary | ICD-10-CM

## 2019-08-09 NOTE — Progress Notes (Signed)
Routine Prenatal Care Visit  Subjective  Isabel Higgins is a 24 y.o. G2P1001 at [redacted]w[redacted]d being seen today for ongoing prenatal care.  She is currently monitored for the following issues for this high-risk pregnancy and has POTS (postural orthostatic tachycardia syndrome); Pelvic pain; Abnormal uterine bleeding; Dysmenorrhea; Endosalpingiosis; History of gestational hypertension; Maternal obesity, antepartum; Supervision of high risk pregnancy, antepartum; BMI 35.0-35.9,adult; Back pain affecting pregnancy in third trimester; and Rectocele affecting obstetric care in second trimester on their problem list.  ----------------------------------------------------------------------------------- Patient reports loss of mucus plug, ?leaking fluid.   Contractions: Irregular. Vag. Bleeding: None.  Movement: Present. Marland Kitchen  ----------------------------------------------------------------------------------- The following portions of the patient's history were reviewed and updated as appropriate: allergies, current medications, past family history, past medical history, past social history, past surgical history and problem list. Problem list updated.  Objective  Blood pressure 122/74, weight 252 lb (114.3 kg), last menstrual period 01/13/2019, currently breastfeeding. Pregravid weight 231 lb (104.8 kg) Total Weight Gain 21 lb (9.526 kg) Urinalysis: Urine Protein    Urine Glucose    Fetal Status: Fetal Heart Rate (bpm): 145 Fundal Height: 32 cm Movement: Present     General:  Alert, oriented and cooperative. Patient is in no acute distress.  Skin: Skin is warm and dry. No rash noted.   Cardiovascular: Normal heart rate noted  Respiratory: Normal respiratory effort, no problems with respiration noted  Abdomen: Soft, gravid, appropriate for gestational age. Pain/Pressure: Present     Pelvic:  Cervical exam deferred        Extremities: Normal range of motion.     Mental Status: Normal mood and affect. Normal  behavior. Normal judgment and thought content.   ROM workup: Nitrazine: negative Ferning: negative Pooling: negative.   Wet Prep: PH: 4 Clue Cells: Negative Fungal elements: Negative Trichomonas: Negative   Assessment   23 y.o. G2P1001 at [redacted]w[redacted]d by  10/15/2019, by Ultrasound presenting for routine prenatal visit  Plan   Pregnancy#2 Problems (from 01/13/19 to present)    Problem Noted Resolved   BMI 35.0-35.9,adult 03/29/2019 by Conard Novak, MD No   History of gestational hypertension 02/09/2019 by Vena Austria, MD No   Maternal obesity, antepartum 02/09/2019 by Vena Austria, MD No   Supervision of high risk pregnancy, antepartum 02/09/2019 by Vena Austria, MD No   Overview Addendum 07/26/2019  9:58 PM by Farrel Conners, CNM    [facility] Prenatal Labs  Dating 8 week [place] on 03/05/19 Blood type: A/Positive/-- (11/17 1008)   Genetic Screen     NIPS:nml XY Antibody:Negative (11/17 1008)  Anatomic [place] [facility] nml Rubella: 1.20 (11/17 1008) Varicella: Immune  GTT Early: 129 Third trimester: 139 RPR: Non Reactive (11/17 1008)   Rhogam n/a HBsAg: Negative (11/17 1008)   TDaP vaccine                       Flu Shot:1/29 HIV: Non Reactive (11/17 1008)   Baby Food                                GBS:   Contraception  Pap:08/2018  CBB  No   CS/VBAC n/a   Support Person               Preterm labor symptoms and general obstetric precautions including but not limited to vaginal bleeding, contractions, leaking of fluid and fetal movement were reviewed in detail with the patient. Please  refer to After Visit Summary for other counseling recommendations.   - offered cervical check. Patient declines - no evidence of ROM or vaginitis. Continue to monitor.  Return in about 2 weeks (around 08/23/2019) for Growth u/s and routine prenatal visit (Keep appts for 6/1).  Prentice Docker, MD, [facility], [facility] 08/09/2019 5:07 PM

## 2019-08-11 ENCOUNTER — Ambulatory Visit: Payer: 59 | Admitting: Physical Therapy

## 2019-08-11 ENCOUNTER — Other Ambulatory Visit: Payer: Self-pay

## 2019-08-11 ENCOUNTER — Encounter: Payer: Self-pay | Admitting: Physical Therapy

## 2019-08-11 DIAGNOSIS — R293 Abnormal posture: Secondary | ICD-10-CM

## 2019-08-11 DIAGNOSIS — M6281 Muscle weakness (generalized): Secondary | ICD-10-CM

## 2019-08-11 DIAGNOSIS — R102 Pelvic and perineal pain: Secondary | ICD-10-CM

## 2019-08-11 NOTE — Therapy (Signed)
[facility] [address] [place], [place], [address] Phone: [phone]   Fax:  [phone]  Physical Therapy Treatment  Patient Details  Name: Isabel Higgins MRN: [id number] Date of Birth: 06/13/1995 Referring Provider (PT): Malachy Mood   Encounter Date: 08/11/2019  PT End of Session - 08/11/19 1706    Visit Number  7    Number of Visits  13    Date for PT Re-Evaluation  09/14/19    Authorization Type  CAID    Authorization - Visit Number  3    Authorization - Number of Visits  8    PT Start Time  1700    PT Stop Time  2694    PT Time Calculation (min)  55 min    Activity Tolerance  Patient tolerated treatment well    Behavior During Therapy  [facility] for tasks assessed/performed       Past Medical History:  Diagnosis Date  . Bradycardia   . Depression   . GERD (gastroesophageal reflux disease)   . Menometrorrhagia 08/07/2016  . Mental disorder   . Psoriasis     Past Surgical History:  Procedure Laterality Date  . INNER EAR SURGERY     right  . LAPAROSCOPY N/A 11/19/2018   Procedure: LAPAROSCOPY DIAGNOSTIC AND BIOPSY;  Surgeon: Gae Dry, MD;  Location: [facility] ORS;  Service: Gynecology;  Laterality: N/A;  . SHOULDER SURGERY     right    There were no vitals filed for this visit.  Subjective Assessment - 08/11/19 1702    Subjective  Patient states that she is doing about the same with pelvic pain/pressure. Patient adds that the presure is her primary complaint and it contributes to the pain, but the pressure is higher intensity. Patient states that she continues to do her HEP and it seems to be helping. Patient states that she is managing even though the pain/pressure isn't fully resolved.    Currently in Pain?  Yes    Pain Score  4     Pain Location  Pelvis       TREATMENT Manual Therapy: Gentle lymphy stimulation and STM of BLE for improved edema management. Patient educated on interventions and  provided with handouts for partner support and home management of swelling.  Neuromuscular Re-education: Quadruped diaphragmatic breathing, VCs and TCs to decrease compensation Quadruped PFM activation with coordinated breath to help support POP and modulate pain at pubic symphysis Quadruped TrA activation with coordinated breathing, VCs and TCs to decrease compensation Quadruped PFM activation with glute set with coordinated breathing, VCs and TCs to decrease compensation  Patient educated throughout session on appropriate technique and form using multi-modal cueing, HEP, and activity modification. Patient articulated understanding and returned demonstration.  Patient Response to interventions: Patient notes comfort with continuing current HEP for self-management and returning PRN until post-partum phase.  ASSESSMENT Patient presents to clinic with excellent motivation to participate in therapy. Patient demonstrates deficits in pain, lumbosacral mobility/stability, posture, balance, PFM coordination/strength, and BLE strength. Patient demonstrating improvement with respect to PFM strength goals during today's session and responded positively to educational and active interventions. Patient will benefit from continued skilled therapeutic intervention to address remaining deficits in pain, lumbosacral mobility/stability, posture, balance, PFM coordination/strength, and BLE strength in order to decrease progression of POP during pregnancy, increase function, and improve overall QOL.    PT Long Term Goals - 08/11/19 1711      PT LONG TERM GOAL #1  Title  Patient will demonstrate independence with HEP in order to maximize therapeutic gains and improve carryover from physical therapy sessions to ADLs in the home and community.    Baseline  IE: not demonstrated;  4/21: IND    Time  12    Period  Weeks    Status  Achieved    Target Date  09/14/19      PT LONG TERM GOAL #2   Title  Patient will  decrease worst pain as reported on NPRS by at least 2 points to demonstrate clinically significant reduction in pain in order to restore/improve function and overall QOL.    Baseline  IE: 10/10; 4/21: 10/10; 5/5: 5-6/10; 5/19: 3-4/10 (pelvic)    Time  12    Period  Weeks    Status  On-going    Target Date  09/14/19      PT LONG TERM GOAL #3   Title  Patient will demonstrate improved toileting posture with knees higher than hips and feet supported, and will report straining <2x/week with bowel movements in order to decrease incidence of constipation/hemorrhoids/mismanagement of intra-abdominal pressure and improve overall QOL.    Baseline  IE: 3+x/week; 4/21: straining with every BM with squatty potty; 5/5: toilet posture achieved, manual assistance via splinting; 5/19: < 1 x/week with splinting strategy, breathing, and toileting posture    Time  12    Period  Weeks    Status  On-going    Target Date  09/14/19      PT LONG TERM GOAL #4   Title  Patient will demonstrate palpable (external) PFM contraction for > 5 seconds for 5 repetitions and cued relaxation of the PFM coordinated with breath for improved management of intra-abdominal pressure and normal bowel function and POP management without the presence of pain in order to improve participation at home and in the community.    Baseline  IE: not demonstrated; 4/21: 3 quick flicks in quadruped position; 5/5: 5x 3 sec with even lift and lower.; 5/19: 3x 5 sec with even lift and lower    Time  12    Period  Weeks    Status  On-going    Target Date  09/14/19      PT LONG TERM GOAL #5   Title  Patient will demonstrate improved function as evidenced by a score of 52 on FOTO measure for full participation in activities at home and in the community.    Baseline  IE: 45; 4/21: 45 (Bowel), 88 (PFDI Prolapse a 38 point worsening), 71 (PFDI Bowel 71 point worsening), 92 (PFDI Pain 17 point worsening); 5/19: 47 (Bowel), 29 (PFDI Prolapse), 13 (PFDI  Bowel), 58 (PFDI Pain);    Time  12    Period  Weeks    Status  On-going    Target Date  09/14/19            Plan - 08/11/19 1709    Clinical Impression Statement  Patient presents to clinic with excellent motivation to participate in therapy. Patient demonstrates deficits in pain, lumbosacral mobility/stability, posture, balance, PFM coordination/strength, and BLE strength. Patient demonstrating improvement with respect to PFM strength goals during today's session and responded positively to educational and active interventions. Patient will benefit from continued skilled therapeutic intervention to address remaining deficits in pain, lumbosacral mobility/stability, posture, balance, PFM coordination/strength, and BLE strength in order to decrease progression of POP during pregnancy, increase function, and improve overall QOL.    Personal Factors and Comorbidities  Age;Education;Sex;Time since onset of injury/illness/exacerbation;Fitness;Past/Current Experience;Comorbidity 3+;Behavior Pattern    Comorbidities  POTS, depression, GERD, rectocele during pregnancy    Examination-Activity Limitations  Sit;Transfers;Sleep;Bed Mobility;Lift;Squat;Stairs;Stand;Caring for Others;Public relations account executive;Reach Overhead;Continence;Bathing    Examination-Participation Restrictions  Yard Work;Interpersonal Relationship;Personal Finances;Cleaning;Laundry;Community Activity;Driving    Stability/Clinical Decision Making  Evolving/Moderate complexity    Rehab Potential  Fair    PT Frequency  1x / week    PT Duration  12 weeks    PT Treatment/Interventions  Cryotherapy;Moist Heat;Ultrasound;Manual techniques;Passive range of motion;Therapeutic activities;Neuromuscular re-education;Balance training;Therapeutic exercise;Patient/family education;Taping;Scar mobilization;Joint Manipulations;Gait training;Compression bandaging    PT Next Visit Plan  Manual PRN; stabilization    PT Home Exercise Plan  toileting  posture; knack with transfers; wear pregnancy support belt    Consulted and Agree with Plan of Care  Patient       Patient will benefit from skilled therapeutic intervention in order to improve the following deficits and impairments:  Abnormal gait, Decreased balance, Decreased endurance, Decreased mobility, Difficulty walking, Improper body mechanics, Pain, Postural dysfunction, Decreased range of motion, Decreased coordination, Decreased strength, Hypermobility, Decreased activity tolerance  Visit Diagnosis: Pelvic pain  Abnormal posture  Muscle weakness (generalized)     Problem List Patient Active Problem List   Diagnosis Date Noted  . Rectocele affecting obstetric care in second trimester 06/21/2019  . Back pain affecting pregnancy in third trimester 06/02/2019  . BMI 35.0-35.9,adult 03/29/2019  . History of gestational hypertension 02/09/2019  . Maternal obesity, antepartum 02/09/2019  . Supervision of high risk pregnancy, antepartum 02/09/2019  . Endosalpingiosis 11/25/2018  . Dysmenorrhea 11/13/2018  . Pelvic pain 07/14/2018  . Abnormal uterine bleeding 07/14/2018  . POTS (postural orthostatic tachycardia syndrome) 03/06/2012   Sheria Lang PT, DPT [phone] 08/11/2019, 5:58 PM  [facility] [address] [place], [place], [address] Phone: [phone]   Fax:  [phone]  Name: MARRY KUSCH MRN: [id number] Date of Birth: 1996-02-19

## 2019-08-17 ENCOUNTER — Encounter: Payer: Self-pay | Admitting: Obstetrics and Gynecology

## 2019-08-17 ENCOUNTER — Observation Stay
Admission: EM | Admit: 2019-08-17 | Discharge: 2019-08-17 | Disposition: A | Discharge status: Home / Self Care | Payer: 59 | Attending: Obstetrics and Gynecology | Admitting: Obstetrics and Gynecology

## 2019-08-17 ENCOUNTER — Emergency Department
Admission: EM | Admit: 2019-08-17 | Discharge: 2019-08-17 | Disposition: A | Discharge status: Home / Self Care | Payer: 59 | Source: Home / Self Care | Attending: Emergency Medicine | Admitting: Emergency Medicine

## 2019-08-17 ENCOUNTER — Telehealth: Payer: Self-pay

## 2019-08-17 ENCOUNTER — Other Ambulatory Visit: Payer: Self-pay

## 2019-08-17 DIAGNOSIS — K219 Gastro-esophageal reflux disease without esophagitis: Secondary | ICD-10-CM | POA: Diagnosis not present

## 2019-08-17 DIAGNOSIS — E669 Obesity, unspecified: Secondary | ICD-10-CM | POA: Insufficient documentation

## 2019-08-17 DIAGNOSIS — R55 Syncope and collapse: Secondary | ICD-10-CM | POA: Diagnosis present

## 2019-08-17 DIAGNOSIS — O99613 Diseases of the digestive system complicating pregnancy, third trimester: Secondary | ICD-10-CM | POA: Insufficient documentation

## 2019-08-17 DIAGNOSIS — Z3A31 31 weeks gestation of pregnancy: Secondary | ICD-10-CM | POA: Diagnosis not present

## 2019-08-17 DIAGNOSIS — Z79899 Other long term (current) drug therapy: Secondary | ICD-10-CM | POA: Insufficient documentation

## 2019-08-17 DIAGNOSIS — O26893 Other specified pregnancy related conditions, third trimester: Principal | ICD-10-CM | POA: Insufficient documentation

## 2019-08-17 DIAGNOSIS — O99213 Obesity complicating pregnancy, third trimester: Secondary | ICD-10-CM | POA: Diagnosis not present

## 2019-08-17 DIAGNOSIS — O0993 Supervision of high risk pregnancy, unspecified, third trimester: Secondary | ICD-10-CM | POA: Diagnosis not present

## 2019-08-17 DIAGNOSIS — R102 Pelvic and perineal pain: Secondary | ICD-10-CM | POA: Diagnosis not present

## 2019-08-17 LAB — URINALYSIS, COMPLETE (UACMP) WITH MICROSCOPIC
Bacteria, UA: NONE SEEN
Bilirubin Urine: NEGATIVE
Glucose, UA: 150 mg/dL — AB
Hgb urine dipstick: NEGATIVE
Ketones, ur: NEGATIVE mg/dL
Leukocytes,Ua: NEGATIVE
Nitrite: NEGATIVE
Protein, ur: NEGATIVE mg/dL
Specific Gravity, Urine: 1.025 (ref 1.005–1.030)
pH: 5 (ref 5.0–8.0)

## 2019-08-17 LAB — CBC
HCT: 37.5 % (ref 36.0–46.0)
Hemoglobin: 12.5 g/dL (ref 12.0–15.0)
MCH: 29.7 pg (ref 26.0–34.0)
MCHC: 33.3 g/dL (ref 30.0–36.0)
MCV: 89.1 fL (ref 80.0–100.0)
Platelets: 199 10*3/uL (ref 150–400)
RBC: 4.21 MIL/uL (ref 3.87–5.11)
RDW: 14 % (ref 11.5–15.5)
WBC: 12.5 10*3/uL — ABNORMAL HIGH (ref 4.0–10.5)
nRBC: 0 % (ref 0.0–0.2)

## 2019-08-17 LAB — BASIC METABOLIC PANEL
Anion gap: 8 (ref 5–15)
BUN: 8 mg/dL (ref 6–20)
CO2: 21 mmol/L — ABNORMAL LOW (ref 22–32)
Calcium: 8.8 mg/dL — ABNORMAL LOW (ref 8.9–10.3)
Chloride: 108 mmol/L (ref 98–111)
Creatinine, Ser: 0.64 mg/dL (ref 0.44–1.00)
GFR calc Af Amer: >60 mL/min (ref >60)
GFR calc non Af Amer: >60 mL/min (ref >60)
Glucose, Bld: 121 mg/dL — ABNORMAL HIGH (ref 70–99)
Potassium: 3.7 mmol/L (ref 3.5–5.1)
Sodium: 137 mmol/L (ref 135–145)

## 2019-08-17 LAB — TROPONIN I (HIGH SENSITIVITY): Troponin I (High Sensitivity): 3 ng/L (ref <18)

## 2019-08-17 MED ORDER — ACETAMINOPHEN 325 MG PO TABS: 650.0000 mg | ORAL_TABLET | ORAL | Status: DC | PRN

## 2019-08-17 MED ORDER — SODIUM CHLORIDE 0.9% FLUSH: 3.0000 mL | Freq: Once | INTRAVENOUS | Status: DC

## 2019-08-17 MED ORDER — ACETAMINOPHEN 500 MG PO TABS
1000.0000 mg | ORAL_TABLET | Freq: Once | ORAL | Status: AC
  Administered 2019-08-17: 1000 mg via ORAL
  Filled 2019-08-17: qty 2

## 2019-08-17 NOTE — Telephone Encounter (Signed)
Spoke w/patient. She was up more than usually yesterday being short staffed at work. She was having regular ctx, but did not time them. Advised on if having more than 4 in 1 hour needs to report to L&D for monitoring. Patient also mentioned having headache. Advised will need to report to L&D thru ED for monitoring

## 2019-08-17 NOTE — Telephone Encounter (Signed)
Patient reports she's been taking her blood pressure and it stays around 130/80-86. She has been having ctx since yesterday which have kind of subsided. Today every once in a while, she will get a hot flash & get nauseas/dizzy. Sometimes she feels like her vision is off/room is spinning. She took her BP when this happened. It was 90-68. She is inquiring if she should be concerned or needs to be seen. Her next scheduled visit is a week out 08/24/2019.

## 2019-08-17 NOTE — ED Notes (Addendum)
Pt c/o feeling dizzy, felt like she was going to pass out, states her BP was low as well with 80 systolic.  Pt also c/o having contractions intermittent since last night.   Called L&D per charge RN, pt will need to be cleared in the ED prior to going up stairs.

## 2019-08-17 NOTE — ED Triage Notes (Signed)
PT is [redacted]wks pregnant with second child. Has been generally not  Feeling well since yesterday. Has felt weak, hot and dizzy. PT states BP normally 110's systolic and went up to 130's and dropped to 80's which was when she felt like she was going to pass out. PT AO, color WDL

## 2019-08-17 NOTE — Discharge Instructions (Signed)
Please proceed directly to labor and delivery for further evaluation. 

## 2019-08-17 NOTE — ED Provider Notes (Signed)
[facility] Emergency Department Provider Note  Time seen: 6:47 PM  I have reviewed the triage vital signs and the nursing notes.   HISTORY  Chief Complaint Near Syncope and Weakness   HPI Isabel Higgins is a 24 y.o. female with a past medical history of depression, gastric reflux, pots, presents to the emergency department for a near syncopal event.  According to the patient she is approximately [redacted] weeks pregnant, states she was crouched down and upon standing began feeling very lightheaded and weak and felt she was going to pass out.  Patient states she has a portable blood pressure cuff because she had an issue with preeclampsia during her last pregnancy.  Checked her blood pressure and it was in the 80s.  Patient came to the emergency department for evaluation.  As a secondary complaint the patient states since last night she has been experiencing lower abdominal "contractions".  Patient states she feels contraction type pain that starts in her abdomen and radiates around to her lower back occurring every few minutes.  States she was going to come last night to be evaluated by OB but did not because her son was already asleep.  Patient denies any chest pain.  States she is feeling much better but still has a mild headache.   Past Medical History:  Diagnosis Date  . Bradycardia   . Depression   . GERD (gastroesophageal reflux disease)   . Menometrorrhagia 08/07/2016  . Mental disorder   . Psoriasis     Patient Active Problem List   Diagnosis Date Noted  . Rectocele affecting obstetric care in second trimester 06/21/2019  . Back pain affecting pregnancy in third trimester 06/02/2019  . BMI 35.0-35.9,adult 03/29/2019  . History of gestational hypertension 02/09/2019  . Maternal obesity, antepartum 02/09/2019  . Supervision of high risk pregnancy, antepartum 02/09/2019  . Endosalpingiosis 11/25/2018  . Dysmenorrhea 11/13/2018  . Pelvic pain 07/14/2018  .  Abnormal uterine bleeding 07/14/2018  . POTS (postural orthostatic tachycardia syndrome) 03/06/2012    Past Surgical History:  Procedure Laterality Date  . INNER EAR SURGERY     right  . LAPAROSCOPY N/A 11/19/2018   Procedure: LAPAROSCOPY DIAGNOSTIC AND BIOPSY;  Surgeon: Nadara Mustard, MD;  Location: [facility] ORS;  Service: Gynecology;  Laterality: N/A;  . SHOULDER SURGERY     right    Prior to Admission medications   Medication Sig Start Date End Date Taking? Authorizing Provider  calcium carbonate (TUMS - DOSED IN MG ELEMENTAL CALCIUM) 500 MG chewable tablet Chew 1 tablet by mouth 2 (two) times daily as needed for indigestion or heartburn.    [provider]  cephALEXin (KEFLEX) 500 MG capsule Take 1 capsule (500 mg total) by mouth 3 (three) times daily. 06/14/19   Tresea Mall, CNM  cyclobenzaprine (FLEXERIL) 10 MG tablet Take 1 tablet (10 mg total) by mouth 3 (three) times daily as needed for muscle spasms. 06/11/19   Vena Austria, MD  folic acid (FOLVITE) 1 MG tablet Take 1 tablet (1 mg total) by mouth daily. 02/09/19   Vena Austria, MD  ondansetron (ZOFRAN ODT) 8 MG disintegrating tablet Take 1 tablet (8 mg total) by mouth every 8 (eight) hours as needed for nausea or vomiting. 03/29/19   Conard Novak, MD  pantoprazole (PROTONIX) 20 MG tablet Take 1 tablet (20 mg total) by mouth daily as needed. 03/08/19   Nadara Mustard, MD  Prenatal-DSS-FeCb-FeGl-FA (CITRANATAL BLOOM) 90-1 MG TABS Take 1 tablet by  mouth daily. 03/10/19   Malachy Mood, MD  promethazine (PHENERGAN) 12.5 MG tablet Take 1 tablet (12.5 mg total) by mouth every 6 (six) hours as needed for nausea or vomiting. 04/12/19   Blake Divine, MD    Allergies  Allergen Reactions  . Honeysuckle Flower [Lonicera] Swelling, Rash and Other (See Comments)    Other Reaction: BREAK OUT, FACIAL SWELLING, WE  . Amoxicillin Nausea And Vomiting    Did it involve swelling of the face/tongue/throat, SOB, or low  BP? No Did it involve sudden or severe rash/hives, skin peeling, or any reaction on the inside of your mouth or nose? No Did you need to seek medical attention at a hospital or doctor's office? No When did it last happen?Can tolerate but gets an upset stomach If all above answers are "NO", may proceed with cephalosporin use.     Family History  Problem Relation Age of Onset  . Hypertension Father   . Diabetes Maternal Grandfather   . Heart attack Maternal Grandmother        x5  . Diabetes Paternal Grandfather     Social History Social History   Tobacco Use  . Smoking status: Never Smoker  . Smokeless tobacco: Never Used  Substance Use Topics  . Alcohol use: No    Alcohol/week: 0.0 standard drinks  . Drug use: No    Review of Systems Constitutional: Negative for fever.  Dizziness, now resolved. Cardiovascular: Negative for chest pain. Respiratory: Negative for shortness of breath. Gastrointestinal: Negative for abdominal pain, vomiting Musculoskeletal: Negative for musculoskeletal complaints Skin: Negative for skin complaints  Neurological: Negative for headache All other ROS negative  ____________________________________________   PHYSICAL EXAM:  VITAL SIGNS: ED Triage Vitals  Enc Vitals Group     BP 08/17/19 1724 (!) 130/92     Pulse Rate 08/17/19 1724 (!) 128     Resp 08/17/19 1724 (!) 22     Temp 08/17/19 1823 98.5 F (36.9 C)     Temp Source 08/17/19 1823 Oral     SpO2 08/17/19 1724 98 %     Weight 08/17/19 1748 252 lb (114.3 kg)     Height 08/17/19 1748 5\' 7"  (1.702 m)     Head Circumference --      Peak Flow --      Pain Score 08/17/19 1748 5     Pain Loc --      Pain Edu? --      Excl. in [place]? --    Constitutional: Alert and oriented. Well appearing and in no distress. Eyes: Normal exam ENT      Head: Normocephalic and atraumatic.      Mouth/Throat: Mucous membranes are moist. Cardiovascular: Regular rhythm rate around 100 to 110 bpm.   No obvious murmur. Respiratory: Normal respiratory effort without tachypnea nor retractions. Breath sounds are clear Gastrointestinal: Soft and nontender. No distention.   Musculoskeletal: Nontender with normal range of motion in all extremities. Neurologic:  Normal speech and language. No gross focal neurologic deficits Skin:  Skin is warm, dry and intact.  Psychiatric: Mood and affect are normal.  ____________________________________________    EKG  EKG viewed and interpreted by myself shows sinus tachycardia 127 bpm with a narrow QRS, normal axis, normal intervals, nonspecific ST changes.  ____________________________________________    INITIAL IMPRESSION / ASSESSMENT AND PLAN / ED COURSE  Pertinent labs & imaging results that were available during my care of the patient were reviewed by me and considered in my medical decision  making (see chart for details).   Patient presents to the emergency department after a near syncopal episode.  Overall the patient appears well, reassuring work-up including lab work.  Blood pressure is normal.  Patient's pulse rate has come down significantly without intervention currently around 100 to 105 bpm.  Patient denies any chest pain at any point.  No pleuritic pain.  Denies shortness of breath.  Given the patient's reassuring work-up I believe she is safe for discharge home from an emergency department standpoint.  However given the patient's complaint of contractions last night and again today I do believe she would benefit from follow-up in labor and delivery.  I spoke to labor and delivery they will see her in obs 3 for further work-up  Isabel Higgins was evaluated in Emergency Department on 08/17/2019 for the symptoms described in the history of present illness. She was evaluated in the context of the global COVID-19 pandemic, which necessitated consideration that the patient might be at risk for infection with the SARS-CoV-2 virus that causes  COVID-19. Institutional protocols and algorithms that pertain to the evaluation of patients at risk for COVID-19 are in a state of rapid change based on information released by regulatory bodies including the CDC and federal and state organizations. These policies and algorithms were followed during the patient's care in the ED.  ____________________________________________   FINAL CLINICAL IMPRESSION(S) / ED DIAGNOSES  Near syncope   Minna Antis, MD 08/17/19 [phone]

## 2019-08-17 NOTE — Discharge Summary (Signed)
Obstetric H&P   Chief Complaint: Near syncopal episode  Prenatal Care Provider: [facility]  History of Present Illness: 24 y.o. G2P1001 8w4dby 10/15/2019, by Ultrasound presenting to L&D after a near syncopal episode.   The patient was crouching, then stood up and began to feel light headed and dizzy.  On her home blood pressure cuff systolic BP was in the 857'S  She felt diaphoretic, began feeling her heart race, and experiencing her vision black out.   She has not had similar episodes previously.  Her parental care has otherwise been unremarkable.  +FM, no LOF, no VB  Pregravid weight 104.8 kg Total Weight Gain 9.526 kg  Pregnancy#2 Problems (from 01/13/19 to present)    Problem Noted Resolved   BMI 35.0-35.9,adult 03/29/2019 by JWill Bonnet MD No   History of gestational hypertension 02/09/2019 by SMalachy Mood MD No   Maternal obesity, antepartum 02/09/2019 by SMalachy Mood MD No   Supervision of high risk pregnancy, antepartum 02/09/2019 by SMalachy Mood MD No   Overview Addendum 07/26/2019  9:58 PM by GDalia Heading [place] Labs  Dating 8 week [place] 03/05/19 Blood type: A/Positive/-- (11/17 1008)   Genetic Screen     NIPS:nml XY Antibody:Negative (11/17 1008)  Anatomic [place] nml Rubella: 1.20 (11/17 1008) Varicella: Immune  GTT Early: 117Third trimester: 139 RPR: Non Reactive (11/17 1008)   Rhogam n/a HBsAg: Negative (11/17 1008)   TDaP vaccine                       Flu Shot:1/29 HIV: Non Reactive (11/17 1008)   Baby Food                                GBS:   Contraception  Pap:08/2018  CBB  No   CS/VBAC n/a   Support Person               Review of Systems: 10 point review of systems negative unless otherwise noted in HPI  Past Medical History: Patient Active Problem List   Diagnosis Date Noted  . Syncope 08/17/2019  . Rectocele affecting obstetric care in second trimester 06/21/2019  . Back pain affecting pregnancy in  third trimester 06/02/2019  . BMI 35.0-35.9,adult 03/29/2019  . History of gestational hypertension 02/09/2019  . Maternal obesity, antepartum 02/09/2019  . Supervision of high risk pregnancy, antepartum 02/09/2019    [facility]  Dating 8 week [place] 03/05/19 Blood type: A/Positive/-- (11/17 1008)   Genetic Screen     NIPS:nml XY Antibody:Negative (11/17 1008)  Anatomic [place] nml Rubella: 1.20 (11/17 1008) Varicella: Immune  GTT Early: 129 Third trimester: 139 RPR: Non Reactive (11/17 1008)   Rhogam n/a HBsAg: Negative (11/17 1008)   TDaP vaccine                       Flu Shot:1/29 HIV: Non Reactive (11/17 1008)   Baby Food                                GBS:   Contraception  Pap:08/2018  CBB  No   CS/VBAC n/a   Support Person        . Endosalpingiosis 11/25/2018  . Dysmenorrhea 11/13/2018  . Pelvic pain 07/14/2018  . Abnormal  uterine bleeding 07/14/2018  . POTS (postural orthostatic tachycardia syndrome) 03/06/2012    Past Surgical History: Past Surgical History:  Procedure Laterality Date  . INNER EAR SURGERY     right  . LAPAROSCOPY N/A 11/19/2018   Procedure: LAPAROSCOPY DIAGNOSTIC AND BIOPSY;  Surgeon: Gae Dry, MD;  Location: [facility] ORS;  Service: Gynecology;  Laterality: N/A;  . SHOULDER SURGERY     right    Past Obstetric History: # 1 - Date: 05/05/16, Sex: Female, Weight: 4460 g, GA: [redacted]w[redacted]d Delivery: Vaginal, Spontaneous, Apgar1: 8, Apgar5: 9, Living: Living, Birth Comments: none observed at delivery  # 2 - Date: None, Sex: None, Weight: None, GA: None, Delivery: None, Apgar1: None, Apgar5: None, Living: None, Birth Comments: None   Family History: Family History  Problem Relation Age of Onset  . Hypertension Father   . Diabetes Maternal Grandfather   . Heart attack Maternal Grandmother        x5  . Diabetes Paternal Grandfather     Social History: Social History   Socioeconomic History  . Marital status: Married    Spouse  name: Not on file  . Number of children: Not on file  . Years of education: Not on file  . Highest education level: Not on file  Occupational History  . Not on file  Tobacco Use  . Smoking status: Never Smoker  . Smokeless tobacco: Never Used  Substance and Sexual Activity  . Alcohol use: No    Alcohol/week: 0.0 standard drinks  . Drug use: No  . Sexual activity: Yes    Birth control/protection: I.U.D.  Other Topics Concern  . Not on file  Social History Narrative  . Not on file   Social Determinants of Health   Financial Resource Strain:   . Difficulty of Paying Living Expenses:   Food Insecurity:   . Worried About RCharity fundraiserin the Last Year:   . RArboriculturistin the Last Year:   Transportation Needs:   . LFilm/video editor(Medical):   .Marland KitchenLack of Transportation (Non-Medical):   Physical Activity:   . Days of Exercise per Week:   . Minutes of Exercise per Session:   Stress:   . Feeling of Stress :   Social Connections:   . Frequency of Communication with Friends and Family:   . Frequency of Social Gatherings with Friends and Family:   . Attends Religious Services:   . Active Member of Clubs or Organizations:   . Attends CArchivistMeetings:   .Marland KitchenMarital Status:   Intimate Partner Violence:   . Fear of Current or Ex-Partner:   . Emotionally Abused:   .Marland KitchenPhysically Abused:   . Sexually Abused:     Medications: Prior to Admission medications   Medication Sig Start Date End Date Taking? Authorizing Provider  calcium carbonate (TUMS - DOSED IN MG ELEMENTAL CALCIUM) 500 MG chewable tablet Chew 1 tablet by mouth 2 (two) times daily as needed for indigestion or heartburn.    [provider]  cephALEXin (KEFLEX) 500 MG capsule Take 1 capsule (500 mg total) by mouth 3 (three) times daily. 06/14/19   GRod Can CNM  cyclobenzaprine (FLEXERIL) 10 MG tablet Take 1 tablet (10 mg total) by mouth 3 (three) times daily as needed for muscle  spasms. 06/11/19   SMalachy Mood MD  folic acid (FOLVITE) 1 MG tablet Take 1 tablet (1 mg total) by mouth daily. 02/09/19   SMalachy Mood MD  ondansetron (ZOFRAN ODT) 8 MG disintegrating tablet Take 1 tablet (8 mg total) by mouth every 8 (eight) hours as needed for nausea or vomiting. 03/29/19   Will Bonnet, MD  pantoprazole (PROTONIX) 20 MG tablet Take 1 tablet (20 mg total) by mouth daily as needed. 03/08/19   Gae Dry, MD  Prenatal-DSS-FeCb-FeGl-FA (CITRANATAL BLOOM) 90-1 MG TABS Take 1 tablet by mouth daily. 03/10/19   Malachy Mood, MD  promethazine (PHENERGAN) 12.5 MG tablet Take 1 tablet (12.5 mg total) by mouth every 6 (six) hours as needed for nausea or vomiting. 04/12/19   Blake Divine, MD    Allergies: Allergies  Allergen Reactions  . Honeysuckle Flower [Lonicera] Swelling, Rash and Other (See Comments)    Other Reaction: BREAK OUT, FACIAL SWELLING, WE  . Amoxicillin Nausea And Vomiting    Did it involve swelling of the face/tongue/throat, SOB, or low BP? No Did it involve sudden or severe rash/hives, skin peeling, or any reaction on the inside of your mouth or nose? No Did you need to seek medical attention at a hospital or doctor's office? No When did it last happen?Can tolerate but gets an upset stomach If all above answers are "NO", may proceed with cephalosporin use.     Physical Exam: Vitals: Blood pressure 114/72, pulse 78, temperature 98.1 F (36.7 C), resp. rate 16, last menstrual period 01/13/2019, currently breastfeeding.   FHT: 145, moderate, +accels, no decels Toco: mild irregular irritability  General: NAD HEENT: normocephalic, anicteric Pulmonary: No increased work of breathing Cardiovascular: RRR, distal pulses 2+ Abdomen: Gravid, non-tender Genitourinary: closed, thick, high Extremities: no edema, erythema, or tenderness Neurologic: Grossly intact Psychiatric: mood appropriate, affect full  Labs: Results for orders  placed or performed during the hospital encounter of 08/17/19 (from the past 24 hour(s))  Basic metabolic panel     Status: Abnormal   Collection Time: 08/17/19  5:58 PM  Result Value Ref Range   Sodium 137 135 - 145 mmol/L   Potassium 3.7 3.5 - 5.1 mmol/L   Chloride 108 98 - 111 mmol/L   CO2 21 (L) 22 - 32 mmol/L   Glucose, Bld 121 (H) 70 - 99 mg/dL   BUN 8 6 - 20 mg/dL   Creatinine, Ser 0.64 0.44 - 1.00 mg/dL   Calcium 8.8 (L) 8.9 - 10.3 mg/dL   GFR calc non Af Amer >60 >60 mL/min   GFR calc Af Amer >60 >60 mL/min   Anion gap 8 5 - 15  CBC     Status: Abnormal   Collection Time: 08/17/19  5:58 PM  Result Value Ref Range   WBC 12.5 (H) 4.0 - 10.5 K/uL   RBC 4.21 3.87 - 5.11 MIL/uL   Hemoglobin 12.5 12.0 - 15.0 g/dL   HCT 37.5 36.0 - 46.0 %   MCV 89.1 80.0 - 100.0 fL   MCH 29.7 26.0 - 34.0 pg   MCHC 33.3 30.0 - 36.0 g/dL   RDW 14.0 11.5 - 15.5 %   Platelets 199 150 - 400 K/uL   nRBC 0.0 0.0 - 0.2 %  Troponin I (High Sensitivity)     Status: None   Collection Time: 08/17/19  5:58 PM  Result Value Ref Range   Troponin I (High Sensitivity) 3 <18 ng/L  Urinalysis, Complete w Microscopic     Status: Abnormal   Collection Time: 08/17/19  6:02 PM  Result Value Ref Range   Color, Urine YELLOW (A) YELLOW   APPearance HAZY (A) CLEAR  Specific Gravity, Urine 1.025 1.005 - 1.030   pH 5.0 5.0 - 8.0   Glucose, UA 150 (A) NEGATIVE mg/dL   Hgb urine dipstick NEGATIVE NEGATIVE   Bilirubin Urine NEGATIVE NEGATIVE   Ketones, ur NEGATIVE NEGATIVE mg/dL   Protein, ur NEGATIVE NEGATIVE mg/dL   Nitrite NEGATIVE NEGATIVE   Leukocytes,Ua NEGATIVE NEGATIVE   RBC / HPF 0-5 0 - 5 RBC/hpf   WBC, UA 0-5 0 - 5 WBC/hpf   Bacteria, UA NONE SEEN NONE SEEN   Squamous Epithelial / LPF 0-5 0 - 5   Mucus PRESENT    Ca Oxalate Crys, UA PRESENT     Assessment: 24 y.o. G2P1001 99w4dby 10/15/2019, by Ultrasound with near syncopal episdoe  Plan: 1) NEar syncopal episode - suspect caval compression  secondary to gravid uterus.  Discussed etiology, precipitating factors, and means of decreasing recurrence  2) Fetus - reactive NST  3) PNL - Blood type A/Positive/-- (11/17 1008) / Anti-bodyscreen Negative (11/17 1008) / Rubella 1.20 (11/17 1008) / Varicella Immune / RPR Non Reactive (04/30 1521) / HBsAg Negative (11/17 1008) / HIV Non Reactive (04/30 1521) / 1-hr OGTT 139 / GBS    4) Immunization History -  Immunization History  Administered Date(s) Administered  . DTaP 10/16/1993, 11/18/1993, 05/23/1994, 07/18/1995, 09/06/1998  . Hepatitis B 0August 10, 1995 10/16/1993, 05/23/1994  . HiB (PRP-OMP) 10/16/1993, 11/18/1993, 05/23/1994, 07/18/1995  . Influenza,inj,Quad PF,6+ Mos 04/23/2019  . MMR 08/17/1994, 09/06/1998  . PFIZER SARS-COV-2 Vaccination 07/03/2019, 07/28/2019  . Tdap 04/07/2014    5) Disposition - discharge home  AMalachy Mood MD, [place] [facility] 08/17/2019, 8:37 PM

## 2019-08-17 NOTE — OB Triage Note (Signed)
Pt was in ED for syncopal episode this afternoon and she developed increasing painful UCs. Pt is a G2P1 at 31.4 weeks. States she has not eaten since 1330 today and has not had much of anything to drink. Dr Bonney Aid updated

## 2019-08-18 ENCOUNTER — Ambulatory Visit: Payer: 59 | Admitting: Physical Therapy

## 2019-08-18 ENCOUNTER — Other Ambulatory Visit: Payer: Self-pay | Admitting: Obstetrics & Gynecology

## 2019-08-24 ENCOUNTER — Ambulatory Visit (INDEPENDENT_AMBULATORY_CARE_PROVIDER_SITE_OTHER): Payer: 59

## 2019-08-24 ENCOUNTER — Ambulatory Visit (INDEPENDENT_AMBULATORY_CARE_PROVIDER_SITE_OTHER): Payer: 59 | Admitting: Certified Nurse Midwife

## 2019-08-24 ENCOUNTER — Encounter: Payer: Self-pay | Admitting: Certified Nurse Midwife

## 2019-08-24 ENCOUNTER — Other Ambulatory Visit: Payer: Self-pay

## 2019-08-24 VITALS — BP 130/72 | Wt 254.2 lb

## 2019-08-24 DIAGNOSIS — O3660X0 Maternal care for excessive fetal growth, unspecified trimester, not applicable or unspecified: Secondary | ICD-10-CM

## 2019-08-24 DIAGNOSIS — Z23 Encounter for immunization: Secondary | ICD-10-CM | POA: Diagnosis not present

## 2019-08-24 DIAGNOSIS — O26843 Uterine size-date discrepancy, third trimester: Secondary | ICD-10-CM

## 2019-08-24 DIAGNOSIS — O99210 Obesity complicating pregnancy, unspecified trimester: Secondary | ICD-10-CM | POA: Diagnosis not present

## 2019-08-24 DIAGNOSIS — O0990 Supervision of high risk pregnancy, unspecified, unspecified trimester: Secondary | ICD-10-CM

## 2019-08-24 DIAGNOSIS — O0993 Supervision of high risk pregnancy, unspecified, third trimester: Secondary | ICD-10-CM

## 2019-08-24 DIAGNOSIS — Z3A32 32 weeks gestation of pregnancy: Secondary | ICD-10-CM

## 2019-08-24 DIAGNOSIS — O09213 Supervision of pregnancy with history of pre-term labor, third trimester: Secondary | ICD-10-CM | POA: Diagnosis not present

## 2019-08-24 DIAGNOSIS — O3663X0 Maternal care for excessive fetal growth, third trimester, not applicable or unspecified: Secondary | ICD-10-CM

## 2019-08-24 LAB — POCT URINALYSIS DIPSTICK OB

## 2019-08-25 ENCOUNTER — Encounter: Payer: 59 | Admitting: Physical Therapy

## 2019-08-26 NOTE — Progress Notes (Signed)
ROB at 32wk4d: Complains of more painful contractions, although they remain irregular. No vaginal bleeding, but has noticed increased colorless discharge in her underwear.  Baby active. Thinking about going out on maternity leave 18 June. She has completed her course of PT, but was advised to return after the birth of her baby.  Growth scan today: CGA 34wk4d with EFW 5#9oz (79.1%, with AC >97%), AFI 17.8cm FHTs 145 with FH 41cm Cervix: TH/closed internal os/ ballotable Nitrazine negative  A: IUP at 32wk4d with S>D, with LGA fetus Pelvic pain/ pressure  P: ROB in 2 weeks Labor precautions TDAP today Breast Mirena  Farrel Conners, CNM

## 2019-09-01 ENCOUNTER — Encounter: Payer: 59 | Admitting: Physical Therapy

## 2019-09-08 ENCOUNTER — Encounter: Payer: Self-pay | Admitting: Obstetrics and Gynecology

## 2019-09-08 ENCOUNTER — Encounter: Payer: 59 | Admitting: Physical Therapy

## 2019-09-08 ENCOUNTER — Other Ambulatory Visit: Payer: Self-pay

## 2019-09-08 ENCOUNTER — Ambulatory Visit (INDEPENDENT_AMBULATORY_CARE_PROVIDER_SITE_OTHER): Payer: 59 | Admitting: Obstetrics and Gynecology

## 2019-09-08 VITALS — BP 110/70 | Ht 67.0 in | Wt 257.8 lb

## 2019-09-08 DIAGNOSIS — O0993 Supervision of high risk pregnancy, unspecified, third trimester: Secondary | ICD-10-CM

## 2019-09-08 DIAGNOSIS — O0990 Supervision of high risk pregnancy, unspecified, unspecified trimester: Secondary | ICD-10-CM

## 2019-09-08 DIAGNOSIS — Z3A34 34 weeks gestation of pregnancy: Secondary | ICD-10-CM

## 2019-09-08 LAB — POCT URINALYSIS DIPSTICK OB

## 2019-09-08 NOTE — Progress Notes (Signed)
Routine Prenatal Care Visit  Subjective  Isabel Higgins is a 24 y.o. G2P1001 at [redacted]w[redacted]d being seen today for ongoing prenatal care.  She is currently monitored for the following issues for this high-risk pregnancy and has POTS (postural orthostatic tachycardia syndrome); Pelvic pain; Abnormal uterine bleeding; Dysmenorrhea; Endosalpingiosis; History of gestational hypertension; Maternal obesity, antepartum; Supervision of high risk pregnancy, antepartum; BMI 35.0-35.9,adult; Back pain affecting pregnancy in third trimester; Rectocele affecting obstetric care in second trimester; and Syncope on their problem list.  ----------------------------------------------------------------------------------- Patient reports backache and irregular contractions. Reports she is considering a trip to the beach. Went into early labor with her last pregnancy but it was stopped at the hospital. She delivered at 37 weeks because of high blood pressures.  Contractions: Irregular. Vag. Bleeding: None.  Movement: Present. Denies leaking of fluid.  ----------------------------------------------------------------------------------- The following portions of the patient's history were reviewed and updated as appropriate: allergies, current medications, past family history, past medical history, past social history, past surgical history and problem list. Problem list updated.   Objective  Blood pressure 110/70, height 5\' 7"  (1.702 m), weight 257 lb 12.8 oz (116.9 kg), last menstrual period 01/13/2019, currently breastfeeding. Pregravid weight 231 lb (104.8 kg) Total Weight Gain 26 lb 12.8 oz (12.2 kg) Urinalysis:      Fetal Status: Fetal Heart Rate (bpm): 144   Movement: Present  Presentation: Vertex  General:  Alert, oriented and cooperative. Patient is in no acute distress.  Skin: Skin is warm and dry. No rash noted.   Cardiovascular: Normal heart rate noted  Respiratory: Normal respiratory effort, no problems  with respiration noted  Abdomen: Soft, gravid, appropriate for gestational age. Pain/Pressure: Present     Pelvic:  Cervical exam performed Dilation: 1 Effacement (%): 50 Station: -2  Extremities: Normal range of motion.  Edema: None  Mental Status: Normal mood and affect. Normal behavior. Normal judgment and thought content.     Assessment   24 y.o. G2P1001 at [redacted]w[redacted]d by  10/15/2019, by Ultrasound presenting for routine prenatal visit  Plan   Pregnancy#2 Problems (from 01/13/19 to present)    Problem Noted Resolved   BMI 35.0-35.9,adult 03/29/2019 by 05/27/2019, MD No   History of gestational hypertension 02/09/2019 by 02/11/2019, MD No   Maternal obesity, antepartum 02/09/2019 by 02/11/2019, MD No   Supervision of high risk pregnancy, antepartum 02/09/2019 by 02/11/2019, MD No   Overview Addendum 08/26/2019  7:19 PM by 10/26/2019, CNM    [facility] Prenatal Labs  Dating 8 week Farrel Conners on 03/05/19 Blood type: A/Positive/-- (11/17 1008)   Genetic Screen     NIPS:nml XY Antibody:Negative (11/17 1008)  Anatomic [birth year]1983 [facility] nml Rubella: 1.20 (11/17 1008) Varicella: Immune  GTT Early: 129 Third trimester: 139 RPR: Non Reactive (11/17 1008)   Rhogam n/a HBsAg: Negative (11/17 1008)   TDaP vaccine      6/1                 Flu Shot:1/29 HIV: Non Reactive (11/17 1008)   Baby Food         Breast                       GBS:   Contraception Mirena Pap:08/2018  CBB  No   CS/VBAC n/a   Support Person           Previous Version       Gestational age appropriate obstetric precautions including  but not limited to vaginal bleeding, contractions, leaking of fluid and fetal movement were reviewed in detail with the patient.    Return in about 2 weeks (around 09/22/2019) for [place] and growth/afi [place].  Homero Fellers MD [facility], [facility] 09/08/2019, 4:37 PM

## 2019-09-15 ENCOUNTER — Encounter: Payer: 59 | Admitting: Physical Therapy

## 2019-09-16 ENCOUNTER — Other Ambulatory Visit: Payer: Self-pay | Admitting: Obstetrics and Gynecology

## 2019-09-16 DIAGNOSIS — N3000 Acute cystitis without hematuria: Secondary | ICD-10-CM

## 2019-09-16 MED ORDER — NITROFURANTOIN MONOHYD MACRO 100 MG PO CAPS: 100.0000 mg | ORAL_CAPSULE | Freq: Two times a day (BID) | ORAL | 1 refills | Status: DC

## 2019-09-20 ENCOUNTER — Other Ambulatory Visit: Payer: Self-pay

## 2019-09-20 ENCOUNTER — Ambulatory Visit (INDEPENDENT_AMBULATORY_CARE_PROVIDER_SITE_OTHER): Payer: 59 | Admitting: Obstetrics & Gynecology

## 2019-09-20 ENCOUNTER — Encounter: Payer: Self-pay | Admitting: Obstetrics & Gynecology

## 2019-09-20 VITALS — BP 140/90 | Wt 260.0 lb

## 2019-09-20 DIAGNOSIS — Z3685 Encounter for antenatal screening for Streptococcus B: Secondary | ICD-10-CM

## 2019-09-20 DIAGNOSIS — O0993 Supervision of high risk pregnancy, unspecified, third trimester: Secondary | ICD-10-CM

## 2019-09-20 DIAGNOSIS — O99213 Obesity complicating pregnancy, third trimester: Secondary | ICD-10-CM

## 2019-09-20 DIAGNOSIS — Z8759 Personal history of other complications of pregnancy, childbirth and the puerperium: Secondary | ICD-10-CM

## 2019-09-20 DIAGNOSIS — Z3A36 36 weeks gestation of pregnancy: Secondary | ICD-10-CM

## 2019-09-20 NOTE — Patient Instructions (Signed)
Group B Streptococcus Infection During Pregnancy °Group B Streptococcus (GBS) is a type of bacteria that is often found in healthy people. It is commonly found in the rectum, vagina, and intestines. In people who are healthy and not pregnant, the bacteria rarely cause serious illness or complications. However, women who test positive for GBS during pregnancy can pass the bacteria to the baby during childbirth. This can cause serious infection in the baby after birth. °Women with GBS may also have infections during their pregnancy or soon after childbirth. The infections include urinary tract infections (UTIs) or infections of the uterus. GBS also increases a woman's risk of complications during pregnancy, such as early labor or delivery, miscarriage, or stillbirth. Routine testing for GBS is recommended for all pregnant women. °What are the causes? °This condition is caused by bacteria called Streptococcus agalactiae. °What increases the risk? °You may have a higher risk for GBS infection during pregnancy if you had one during a past pregnancy. °What are the signs or symptoms? °In most cases, GBS infection does not cause symptoms in pregnant women. If symptoms exist, they may include: °· Labor that starts before the 37th week of pregnancy. °· A UTI or bladder infection. This may cause a fever, frequent urination, or pain and burning during urination. °· Fever during labor. There can also be a rapid heartbeat in the mother or baby. °Rare but serious symptoms of a GBS infection in women include: °· Blood infection (septicemia). This may cause fever, chills, or confusion. °· Lung infection (pneumonia). This may cause fever, chills, cough, rapid breathing, chest pain, or difficulty breathing. °· Bone, joint, skin, or soft tissue infection. °How is this diagnosed? °You may be screened for GBS between week 35 and week 37 of pregnancy. If you have symptoms of preterm labor, you may be screened earlier. This condition is  diagnosed based on lab test results from: °· A swab of fluid from the vagina and rectum. °· A urine sample. °How is this treated? °This condition is treated with antibiotic medicine. Antibiotic medicine may be given: °· To you when you go into labor, or as soon as your water breaks. The medicines will continue until after you give birth. If you are having a cesarean delivery, you do not need antibiotics unless your water has broken. °· To your baby, if he or she requires treatment. Your health care provider will check your baby to decide if he or she needs antibiotics to prevent a serious infection. °Follow these instructions at home: °· Take over-the-counter and prescription medicines only as told by your health care provider. °· Take your antibiotic medicine as told by your health care provider. Do not stop taking the antibiotic even if you start to feel better. °· Keep all pre-birth (prenatal) visits and follow-up visits as told by your health care provider. This is important. °Contact a health care provider if: °· You have pain or burning when you urinate. °· You have to urinate more often than usual. °· You have a fever or chills. °· You develop a bad-smelling vaginal discharge. °Get help right away if: °· Your water breaks. °· You go into labor. °· You have severe pain in your abdomen. °· You have difficulty breathing. °· You have chest pain. °These symptoms may represent a serious problem that is an emergency. Do not wait to see if the symptoms will go away. Get medical help right away. Call your local emergency services (911 in the [place] Do not drive yourself to   the hospital. °Summary °· GBS is a type of bacteria that is common in healthy people. °· During pregnancy, colonization with GBS can cause serious complications for you or your baby. °· Your health care provider will screen you between 35 and 37 weeks of pregnancy to determine if you are colonized with GBS. °· If you are colonized with GBS during  pregnancy, your health care provider will recommend antibiotics through an IV during labor. °· After delivery, your baby will be evaluated for complications related to potential GBS infection and may require antibiotics to prevent a serious infection. °This information is not intended to replace advice given to you by your health care provider. Make sure you discuss any questions you have with your health care provider. °Document Revised: 10/05/2018 Document Reviewed: 10/05/2018 °Elsevier Patient Education © 2020 [employer] ° °

## 2019-09-20 NOTE — Progress Notes (Signed)
  Subjective  Fetal Movement? yes Contractions? Irreg, mild    Seen last Thurs at [facility] and was 3 cm; did not progress in labor then Leaking Fluid? no Vaginal Bleeding? no  Objective  BP 140/90   Wt 260 lb (117.9 kg)   LMP 01/13/2019 (Exact Date)   BMI 40.72 kg/m  General: NAD Pumonary: no increased work of breathing Abdomen: gravid, non-tender Extremities: no edema Psychiatric: mood appropriate, affect full SVE 3/60/-3 Assessment  24 y.o. G2P1001 at [redacted]w[redacted]d by  10/15/2019, by Ultrasound presenting for routine prenatal visit  Plan   Problem List Items Addressed This Visit      Other   History of gestational hypertension   Maternal obesity, antepartum   Supervision of high risk pregnancy, antepartum    Other Visit Diagnoses    [redacted] weeks gestation of pregnancy    -  Primary   Encounter for antenatal screening for Streptococcus B       Relevant Orders   Culture, beta strep (group b only)      Pregnancy#2 Problems (from 01/13/19 to present)    Problem Noted Resolved   BMI 35.0-35.9,adult 03/29/2019 by Conard Novak, MD No   History of gestational hypertension 02/09/2019 by Vena Austria, MD No   Maternal obesity, antepartum 02/09/2019 by Vena Austria, MD No   Supervision of high risk pregnancy, antepartum 02/09/2019 by Vena Austria, MD No   Overview Addendum 08/26/2019  7:19 PM by Farrel Conners, CNM    [facility] Prenatal Labs  Dating 8 week [place] on 03/05/19 Blood type: A/Positive/-- (11/17 1008)   Genetic Screen     NIPS:nml XY Antibody:Negative (11/17 1008)  Anatomic [place] [facility] nml Rubella: 1.20 (11/17 1008) Varicella: Immune  GTT Early: 129 Third trimester: 139 RPR: Non Reactive (11/17 1008)   Rhogam n/a HBsAg: Negative (11/17 1008)   TDaP vaccine      6/1                 Flu Shot:1/29 HIV: Non Reactive (11/17 1008)   Baby Food         Breast                       GBS: TODAY  Contraception Mirena Pap:08/2018  CBB  No   CS/VBAC n/a    Support Person           PNV, [facility], Labor precautions, GBS  Annamarie Major, MD, [facility], [facility] 09/20/2019  5:05 PM

## 2019-09-21 ENCOUNTER — Observation Stay
Admission: EM | Admit: 2019-09-21 | Discharge: 2019-09-21 | Disposition: A | Discharge status: Home / Self Care | Payer: 59 | Attending: Obstetrics & Gynecology | Admitting: Obstetrics & Gynecology

## 2019-09-21 ENCOUNTER — Encounter: Payer: Self-pay | Admitting: Obstetrics & Gynecology

## 2019-09-21 ENCOUNTER — Other Ambulatory Visit: Payer: Self-pay

## 2019-09-21 DIAGNOSIS — K219 Gastro-esophageal reflux disease without esophagitis: Secondary | ICD-10-CM | POA: Diagnosis not present

## 2019-09-21 DIAGNOSIS — Z3A36 36 weeks gestation of pregnancy: Secondary | ICD-10-CM | POA: Insufficient documentation

## 2019-09-21 DIAGNOSIS — Z6835 Body mass index (BMI) 35.0-35.9, adult: Secondary | ICD-10-CM

## 2019-09-21 DIAGNOSIS — O99213 Obesity complicating pregnancy, third trimester: Secondary | ICD-10-CM | POA: Diagnosis not present

## 2019-09-21 DIAGNOSIS — O0990 Supervision of high risk pregnancy, unspecified, unspecified trimester: Secondary | ICD-10-CM

## 2019-09-21 DIAGNOSIS — R519 Headache, unspecified: Secondary | ICD-10-CM | POA: Insufficient documentation

## 2019-09-21 DIAGNOSIS — O99613 Diseases of the digestive system complicating pregnancy, third trimester: Secondary | ICD-10-CM | POA: Diagnosis not present

## 2019-09-21 DIAGNOSIS — Z8759 Personal history of other complications of pregnancy, childbirth and the puerperium: Secondary | ICD-10-CM

## 2019-09-21 DIAGNOSIS — O26899 Other specified pregnancy related conditions, unspecified trimester: Secondary | ICD-10-CM | POA: Diagnosis present

## 2019-09-21 DIAGNOSIS — O26893 Other specified pregnancy related conditions, third trimester: Secondary | ICD-10-CM | POA: Diagnosis not present

## 2019-09-21 DIAGNOSIS — O99210 Obesity complicating pregnancy, unspecified trimester: Secondary | ICD-10-CM

## 2019-09-21 LAB — PROTEIN / CREATININE RATIO, URINE
Creatinine, Urine: 82 mg/dL
Protein Creatinine Ratio: 0.12 mg/mg{Cre} (ref 0.00–0.15)
Total Protein, Urine: 10 mg/dL

## 2019-09-21 MED ORDER — ONDANSETRON HCL 4 MG/2ML IJ SOLN: 4.0000 mg | Freq: Four times a day (QID) | INTRAMUSCULAR | Status: DC | PRN

## 2019-09-21 MED ORDER — LIDOCAINE HCL (PF) 1 % IJ SOLN: 30.0000 mL | INTRAMUSCULAR | Status: DC | PRN

## 2019-09-21 MED ORDER — ACETAMINOPHEN 325 MG PO TABS
650.0000 mg | ORAL_TABLET | ORAL | Status: DC | PRN
  Administered 2019-09-21: 650 mg via ORAL
  Filled 2019-09-21: qty 2

## 2019-09-21 NOTE — Discharge Instructions (Signed)
Braxton Hicks Contractions °Contractions of the uterus can occur throughout pregnancy, but they are not always a sign that you are in labor. You may have practice contractions called Braxton Hicks contractions. These false labor contractions are sometimes confused with true labor. °What are Braxton Hicks contractions? °Braxton Hicks contractions are tightening movements that occur in the muscles of the uterus before labor. Unlike true labor contractions, these contractions do not result in opening (dilation) and thinning of the cervix. Toward the end of pregnancy (32-34 weeks), Braxton Hicks contractions can happen more often and may become stronger. These contractions are sometimes difficult to tell apart from true labor because they can be very uncomfortable. You should not feel embarrassed if you go to the hospital with false labor. °Sometimes, the only way to tell if you are in true labor is for your health care provider to look for changes in the cervix. The health care provider will do a physical exam and may monitor your contractions. If you are not in true labor, the exam should show that your cervix is not dilating and your water has not broken. °If there are no other health problems associated with your pregnancy, it is completely safe for you to be sent home with false labor. You may continue to have Braxton Hicks contractions until you go into true labor. °How to tell the difference between true labor and false labor °True labor °· Contractions last 30-70 seconds. °· Contractions become very regular. °· Discomfort is usually felt in the top of the uterus, and it spreads to the lower abdomen and low back. °· Contractions do not go away with walking. °· Contractions usually become more intense and increase in frequency. °· The cervix dilates and gets thinner. °False labor °· Contractions are usually shorter and not as strong as true labor contractions. °· Contractions are usually irregular. °· Contractions  are often felt in the front of the lower abdomen and in the groin. °· Contractions may go away when you walk around or change positions while lying down. °· Contractions get weaker and are shorter-lasting as time goes on. °· The cervix usually does not dilate or become thin. °Follow these instructions at home: ° °· Take over-the-counter and prescription medicines only as told by your health care provider. °· Keep up with your usual exercises and follow other instructions from your health care provider. °· Eat and drink lightly if you think you are going into labor. °· If Braxton Hicks contractions are making you uncomfortable: °? Change your position from lying down or resting to walking, or change from walking to resting. °? Sit and rest in a tub of warm water. °? Drink enough fluid to keep your urine pale yellow. Dehydration may cause these contractions. °? Do slow and deep breathing several times an hour. °· Keep all follow-up prenatal visits as told by your health care provider. This is important. °Contact a health care provider if: °· You have a fever. °· You have continuous pain in your abdomen. °Get help right away if: °· Your contractions become stronger, more regular, and closer together. °· You have fluid leaking or gushing from your vagina. °· You pass blood-tinged mucus (bloody show). °· You have bleeding from your vagina. °· You have low back pain that you never had before. °· You feel your baby’s head pushing down and causing pelvic pressure. °· Your baby is not moving inside you as much as it used to. °Summary °· Contractions that occur before labor are   called Braxton Hicks contractions, false labor, or practice contractions. °· Braxton Hicks contractions are usually shorter, weaker, farther apart, and less regular than true labor contractions. True labor contractions usually become progressively stronger and regular, and they become more frequent. °· Manage discomfort from Braxton Hicks contractions  by changing position, resting in a warm bath, drinking plenty of water, or practicing deep breathing. °This information is not intended to replace advice given to you by your health care provider. Make sure you discuss any questions you have with your health care provider. °Document Revised: 02/21/2017 Document Reviewed: 07/25/2016 °Elsevier Patient Education © 2020 [employer] ° °

## 2019-09-21 NOTE — Final Progress Note (Signed)
Physician Final Progress Note  Patient ID: Isabel Higgins MRN: [id number] DOB/AGE: Dec 08, 1995 24 y.o.  Admit date: 09/21/2019 Admitting provider: Nadara Mustard, MD Discharge date: 09/21/2019  Admission Diagnoses: Headache, lower abdominal pains, Nausea 36 weeks pregnancy  Discharge Diagnoses:  Active Problems:   Headache in pregnancy   Same  Consults: None  Significant Findings/ Diagnostic Studies:  Obstetrics Admission History & Physical   No chief complaint on file.   HPI:  24 y.o. G2P1001 @ [redacted]w[redacted]d (10/15/2019, by Ultrasound). Admitted on 09/21/2019:   Patient Active Problem List   Diagnosis Date Noted   Headache in pregnancy 09/21/2019   Syncope 08/17/2019   Rectocele affecting obstetric care in second trimester 06/21/2019   Back pain affecting pregnancy in third trimester 06/02/2019   BMI 35.0-35.9,adult 03/29/2019   History of gestational hypertension 02/09/2019   Maternal obesity, antepartum 02/09/2019   Supervision of high risk pregnancy, antepartum 02/09/2019   Endosalpingiosis 11/25/2018   Dysmenorrhea 11/13/2018   Pelvic pain 07/14/2018   Abnormal uterine bleeding 07/14/2018   POTS (postural orthostatic tachycardia syndrome) 03/06/2012     Presents for Headache that began today, frontal, no radiation, has had in past due to stress, no associated findings other than nausea, no help w diet, rest at home.  Also reports lower abd cramping since exam yesterday in office.  No radiation.  Loss of mucus, no ROM of VB.  Prenatal care at: at [facility]. Pregnancy complicated by obesity, h/o get HTN, macrosomia..  ROS: A review of systems was performed and negative, except as stated in the above HPI. PMHx:  Past Medical History:  Diagnosis Date   Bradycardia    Depression    GERD (gastroesophageal reflux disease)    Menometrorrhagia 08/07/2016   Mental disorder    Psoriasis    PSHx:  Past Surgical History:  Procedure Laterality Date    INNER EAR SURGERY     right   LAPAROSCOPY N/A 11/19/2018   Procedure: LAPAROSCOPY DIAGNOSTIC AND BIOPSY;  Surgeon: Nadara Mustard, MD;  Location: [facility] ORS;  Service: Gynecology;  Laterality: N/A;   SHOULDER SURGERY     right   Medications:  Medications Prior to Admission  Medication Sig Dispense Refill Last Dose   calcium carbonate (TUMS - DOSED IN MG ELEMENTAL CALCIUM) 500 MG chewable tablet Chew 1 tablet by mouth 2 (two) times daily as needed for indigestion or heartburn.   09/20/2019 at Unknown time   folic acid (FOLVITE) 1 MG tablet Take 1 tablet (1 mg total) by mouth daily. 30 tablet 10 09/20/2019 at Unknown time   pantoprazole (PROTONIX) 20 MG tablet TAKE 1 TABLET BY MOUTH EVERY DAY AS NEEDED 30 tablet 4 09/20/2019 at Unknown time   Prenatal-DSS-FeCb-FeGl-FA (CITRANATAL BLOOM) 90-1 MG TABS Take 1 tablet by mouth daily. 30 tablet 11 09/20/2019 at Unknown time   cephALEXin (KEFLEX) 500 MG capsule Take 1 capsule (500 mg total) by mouth 3 (three) times daily. (Patient not taking: Reported on 09/21/2019) 21 capsule 0 Unknown at Unknown time   cyclobenzaprine (FLEXERIL) 10 MG tablet Take 1 tablet (10 mg total) by mouth 3 (three) times daily as needed for muscle spasms. (Patient not taking: Reported on 09/21/2019) 30 tablet 0 Unknown at Unknown time   nitrofurantoin, macrocrystal-monohydrate, (MACROBID) 100 MG capsule Take 1 capsule (100 mg total) by mouth 2 (two) times daily. (Patient not taking: Reported on 09/21/2019) 14 capsule 1 Unknown at Unknown time   ondansetron (ZOFRAN ODT) 8 MG disintegrating tablet Take 1 tablet (8  mg total) by mouth every 8 (eight) hours as needed for nausea or vomiting. (Patient not taking: Reported on 09/21/2019) 30 tablet 1 Unknown at Unknown time   promethazine (PHENERGAN) 12.5 MG tablet Take 1 tablet (12.5 mg total) by mouth every 6 (six) hours as needed for nausea or vomiting. (Patient not taking: Reported on 09/21/2019) 15 tablet 0 Unknown at Unknown time    Allergies: is allergic to honeysuckle flower [lonicera] and amoxicillin. OBHx:  OB History  Gravida Para Term Preterm AB Living  2 1 1  0 0 1  SAB TAB Ectopic Multiple Live Births        0 1    # Outcome Date GA Lbr Len/2nd Weight Sex Delivery Anes PTL Lv  2 Current           1 Term 05/05/16 [redacted]w[redacted]d 09:30 / 01:11 4460 g M Vag-Spont EPI  LIV     Birth Comments: none observed at delivery   [redacted]w[redacted]d except as detailed in HPI.ZHG:DJMEQAST/[id number]  No family history of birth defects. Soc Hx: Alcohol: none and Recreational drug use: none  Objective:   Vitals:   09/21/19 1533  BP: 134/78  Pulse: (!) 102  Resp: 17  Temp: 98.8 F (37.1 C)   Constitutional: Well nourished, well developed female in no acute distress.  HEENT: normal Skin: Warm and dry.  Cardiovascular:Regular rate and rhythm.   Extremity: trace to 1+ bilateral pedal edema Respiratory: Clear to auscultation bilateral. Normal respiratory effort Abdomen: gravid, ND, FHT present, mild tenderness on exam Back: no CVAT Neuro: DTRs 2+, Cranial nerves grossly intact Psych: Alert and Oriented x3. No memory deficits. Normal mood and affect.  MS: normal gait, normal bilateral lower extremity ROM/strength/stability.  EFM:FHR: 140 bpm, variability: moderate,  accelerations:  Present,  decelerations:  Absent Toco: None  Assessment & Plan:   24 y.o. G2P1001 @ [redacted]w[redacted]d, Admitted on 09/21/2019:headache, treated w Tylenol w success. No s/sx PTL. Home monitoring, stress reduction, Tylenol advised    Procedures: A NST procedure was performed with FHR monitoring and a normal baseline established, appropriate time of 20-40 minutes of evaluation, and accels >2 seen w 15x15 characteristics.  Results show a REACTIVE NST.    Discharge Condition: good  Disposition: Discharge disposition: 01-Home or Self Care       Diet: Regular diet  Discharge Activity: Activity as tolerated  Discharge Instructions    Call MD for:   Complete by:  As directed    Worsening contractions or pain; leakage of fluid; bleeding.   Diet general   Complete by: As directed    Increase activity slowly   Complete by: As directed      Allergies as of 09/21/2019      Reactions   Honeysuckle Flower [lonicera] Swelling, Rash, Other (See Comments)   Other Reaction: BREAK OUT, FACIAL SWELLING, WE   Amoxicillin Nausea And Vomiting   Did it involve swelling of the face/tongue/throat, SOB, or low BP? No Did it involve sudden or severe rash/hives, skin peeling, or any reaction on the inside of your mouth or nose? No Did you need to seek medical attention at a hospital or doctor's office? No When did it last happen?Can tolerate but gets an upset stomach If all above answers are "NO", may proceed with cephalosporin use.      Medication List    TAKE these medications   calcium carbonate 500 MG chewable tablet Commonly known as: TUMS - dosed in mg elemental calcium Chew 1 tablet by mouth 2 (  two) times daily as needed for indigestion or heartburn.   cephALEXin 500 MG capsule Commonly known as: KEFLEX Take 1 capsule (500 mg total) by mouth 3 (three) times daily.   CitraNatal Bloom 90-1 MG Tabs Take 1 tablet by mouth daily.   cyclobenzaprine 10 MG tablet Commonly known as: FLEXERIL Take 1 tablet (10 mg total) by mouth 3 (three) times daily as needed for muscle spasms.   folic acid 1 MG tablet Commonly known as: FOLVITE Take 1 tablet (1 mg total) by mouth daily.   nitrofurantoin (macrocrystal-monohydrate) 100 MG capsule Commonly known as: MACROBID Take 1 capsule (100 mg total) by mouth 2 (two) times daily.   ondansetron 8 MG disintegrating tablet Commonly known as: Zofran ODT Take 1 tablet (8 mg total) by mouth every 8 (eight) hours as needed for nausea or vomiting.   pantoprazole 20 MG tablet Commonly known as: PROTONIX TAKE 1 TABLET BY MOUTH EVERY DAY AS NEEDED   promethazine 12.5 MG tablet Commonly known as: PHENERGAN Take 1  tablet (12.5 mg total) by mouth every 6 (six) hours as needed for nausea or vomiting.       Follow-up Information    Nadara Mustard, MD. Go in 2 day(s).   Specialty: Obstetrics and Gynecology Contact information: [address] [place] [address] [phone]               Total time spent taking care of this patient: 20 minutes  Signed: Letitia Libra 09/21/2019, 5:33 PM

## 2019-09-21 NOTE — Discharge Summary (Signed)
  See FPN 

## 2019-09-22 ENCOUNTER — Encounter: Payer: 59 | Admitting: Physical Therapy

## 2019-09-23 ENCOUNTER — Ambulatory Visit (INDEPENDENT_AMBULATORY_CARE_PROVIDER_SITE_OTHER): Payer: 59

## 2019-09-23 ENCOUNTER — Ambulatory Visit (INDEPENDENT_AMBULATORY_CARE_PROVIDER_SITE_OTHER): Payer: 59 | Admitting: Certified Nurse Midwife

## 2019-09-23 ENCOUNTER — Other Ambulatory Visit: Payer: Self-pay

## 2019-09-23 VITALS — BP 128/70 | Ht 67.0 in | Wt 261.2 lb

## 2019-09-23 DIAGNOSIS — Z3A36 36 weeks gestation of pregnancy: Secondary | ICD-10-CM

## 2019-09-23 DIAGNOSIS — O0990 Supervision of high risk pregnancy, unspecified, unspecified trimester: Secondary | ICD-10-CM | POA: Diagnosis not present

## 2019-09-23 DIAGNOSIS — Z6841 Body Mass Index (BMI) 40.0 and over, adult: Secondary | ICD-10-CM

## 2019-09-23 DIAGNOSIS — O0993 Supervision of high risk pregnancy, unspecified, third trimester: Secondary | ICD-10-CM

## 2019-09-23 LAB — POCT URINALYSIS DIPSTICK OB
Glucose, UA: NEGATIVE
POC,PROTEIN,UA: NEGATIVE

## 2019-09-24 NOTE — Progress Notes (Signed)
Follow up at 36wk6d after L&D visit 2 days ago for contractions and headache. Headache resolved with Tylenol, blood pressure was normal and cervix had not changed (3cm). NST was reactive. [place] and [place] cr[place]. [place] and released when the contractions petered out and she made no further progress past 3 cm. Complains or irregular contractions and increased pelvic pressure. No vaginal bleeding  Growth scan today: EFW 8#4oz (>90th%) and AFI 12.4cm, cephalic FHT 144. FH 45cm. Cervix: 2cm/50-60%/-1/very posterior  A: IUP at 36.6 weeks S>D Hx of fetal macrosomia with first pregnancy  P: GBS pending Labor precautions NST/ROB in 1 week if NIL  Farrel Conners, CNM

## 2019-09-25 LAB — CULTURE, BETA STREP (GROUP B ONLY): Strep Gp B Culture: NEGATIVE

## 2019-09-27 ENCOUNTER — Other Ambulatory Visit: Payer: Self-pay

## 2019-09-27 ENCOUNTER — Encounter: Payer: Self-pay | Admitting: Obstetrics and Gynecology

## 2019-09-27 ENCOUNTER — Observation Stay
Admission: EM | Admit: 2019-09-27 | Discharge: 2019-09-27 | Disposition: A | Discharge status: Home / Self Care | Payer: 59 | Attending: Obstetrics and Gynecology | Admitting: Obstetrics and Gynecology

## 2019-09-27 DIAGNOSIS — O0990 Supervision of high risk pregnancy, unspecified, unspecified trimester: Secondary | ICD-10-CM | POA: Diagnosis not present

## 2019-09-27 DIAGNOSIS — Z3A37 37 weeks gestation of pregnancy: Secondary | ICD-10-CM

## 2019-09-27 DIAGNOSIS — Z880 Allergy status to penicillin: Secondary | ICD-10-CM | POA: Insufficient documentation

## 2019-09-27 DIAGNOSIS — Z6841 Body Mass Index (BMI) 40.0 and over, adult: Secondary | ICD-10-CM

## 2019-09-27 DIAGNOSIS — O99210 Obesity complicating pregnancy, unspecified trimester: Secondary | ICD-10-CM

## 2019-09-27 DIAGNOSIS — O471 False labor at or after 37 completed weeks of gestation: Secondary | ICD-10-CM | POA: Diagnosis present

## 2019-09-27 DIAGNOSIS — Z8759 Personal history of other complications of pregnancy, childbirth and the puerperium: Secondary | ICD-10-CM

## 2019-09-27 LAB — URINALYSIS, ROUTINE W REFLEX MICROSCOPIC
Bilirubin Urine: NEGATIVE
Glucose, UA: 50 mg/dL — AB
Hgb urine dipstick: NEGATIVE
Ketones, ur: NEGATIVE mg/dL
Nitrite: NEGATIVE
Protein, ur: NEGATIVE mg/dL
Specific Gravity, Urine: 1.011 (ref 1.005–1.030)
pH: 6 (ref 5.0–8.0)

## 2019-09-27 NOTE — Discharge Summary (Signed)
See final Progress Note  Mirna Mires, CNM  09/27/2019 5:27 PM

## 2019-09-27 NOTE — Final Progress Note (Signed)
Final Progress Note  Patient ID: Isabel Higgins MRN: [id number] DOB/AGE: [birth year] 24 y.o.  Admit date: 09/27/2019 Admitting provider: Mirna Mires, CNM Discharge date: 09/27/2019   Admission Diagnoses:  1) IUP at [redacted]w[redacted]d  2) contractions  Discharge Diagnoses:  Active Problems:   [redacted] weeks gestation of pregnancy  False labor  History of Present Illness: The patient is a 24 y.o. female G2P1001 at [redacted]w[redacted]d who presents for  irregular contractions. She denies any LOF or vaginal bleeding. Her baby is moving well. No recent IC. She has not been timing the contractions as they are still irregular and she is talking through them.. .   Past Medical History:  Diagnosis Date  . Bradycardia   . Depression   . GERD (gastroesophageal reflux disease)   . Menometrorrhagia 08/07/2016  . Mental disorder   . Psoriasis     Past Surgical History:  Procedure Laterality Date  . INNER EAR SURGERY     right  . LAPAROSCOPY N/A 11/19/2018   Procedure: LAPAROSCOPY DIAGNOSTIC AND BIOPSY;  Surgeon: Nadara Mustard, MD;  Location: [facility] ORS;  Service: Gynecology;  Laterality: N/A;  . SHOULDER SURGERY     right    No current facility-administered medications on file prior to encounter.   Current Outpatient Medications on File Prior to Encounter  Medication Sig Dispense Refill  . calcium carbonate (TUMS - DOSED IN MG ELEMENTAL CALCIUM) 500 MG chewable tablet Chew 1 tablet by mouth 2 (two) times daily as needed for indigestion or heartburn.    . folic acid (FOLVITE) 1 MG tablet Take 1 tablet (1 mg total) by mouth daily. 30 tablet 10  . pantoprazole (PROTONIX) 20 MG tablet TAKE 1 TABLET BY MOUTH EVERY DAY AS NEEDED 30 tablet 4  . Prenatal-DSS-FeCb-FeGl-FA (CITRANATAL BLOOM) 90-1 MG TABS Take 1 tablet by mouth daily. 30 tablet 11    Allergies  Allergen Reactions  . Honeysuckle Flower [Lonicera] Swelling, Rash and Other (See Comments)    Other Reaction: BREAK OUT, FACIAL SWELLING, WE  . Amoxicillin  Nausea And Vomiting    Did it involve swelling of the face/tongue/throat, SOB, or low BP? No Did it involve sudden or severe rash/hives, skin peeling, or any reaction on the inside of your mouth or nose? No Did you need to seek medical attention at a hospital or doctor's office? No When did it last happen?Can tolerate but gets an upset stomach If all above answers are "NO", may proceed with cephalosporin use.     Social History   Socioeconomic History  . Marital status: Married    Spouse name: Not on file  . Number of children: 1  . Years of education: Not on file  . Highest education level: Not on file  Occupational History  . Not on file  Tobacco Use  . Smoking status: Never Smoker  . Smokeless tobacco: Never Used  Vaping Use  . Vaping Use: Never used  Substance and Sexual Activity  . Alcohol use: No    Alcohol/week: 0.0 standard drinks  . Drug use: No  . Sexual activity: Yes    Birth control/protection: I.U.D.  Other Topics Concern  . Not on file  Social History Narrative  . Not on file   Social Determinants of Health   Financial Resource Strain:   . Difficulty of Paying Living Expenses:   Food Insecurity:   . Worried About Programme researcher, broadcasting/film/video in the Last Year:   . [employer] of Food in the Last  Year:   Transportation Needs:   . Freight forwarder (Medical):   Marland Kitchen Lack of Transportation (Non-Medical):   Physical Activity:   . Days of Exercise per Week:   . Minutes of Exercise per Session:   Stress:   . Feeling of Stress :   Social Connections:   . Frequency of Communication with Friends and Family:   . Frequency of Social Gatherings with Friends and Family:   . Attends Religious Services:   . Active Member of Clubs or Organizations:   . Attends Banker Meetings:   Marland Kitchen Marital Status:   Intimate Partner Violence:   . Fear of Current or Ex-Partner:   . Emotionally Abused:   Marland Kitchen Physically Abused:   . Sexually Abused:     Family History   Problem Relation Age of Onset  . Hypertension Father   . Diabetes Maternal Grandfather   . Heart attack Maternal Grandmother        x5  . Diabetes Paternal Grandfather      Review of Systems  Constitutional: Negative.   HENT: Negative.   Eyes: Negative.   Respiratory: Negative.   Cardiovascular: Negative.   Gastrointestinal: Positive for abdominal pain (ctx). Negative for nausea and vomiting.  Genitourinary: Negative.   Musculoskeletal: Negative.   Skin: Negative.   Neurological: Negative.   Endo/Heme/Allergies: Negative.   Psychiatric/Behavioral: Negative.      Physical Exam: BP 137/86 (BP Location: Left Arm)   Pulse (!) 103   Temp 98.2 F (36.8 C) (Oral)   Resp 16   Ht 5\' 7"  (1.702 m)   Wt 117.9 kg   LMP 01/13/2019 (Exact Date)   SpO2 100%   BMI 40.72 kg/m   Physical Exam Constitutional:      General: She is not in acute distress.    Appearance: Normal appearance.  Genitourinary:     Genitourinary Comments: VE: 3 cms/40%/-3  HENT:     Head: Normocephalic and atraumatic.  Eyes:     General: No scleral icterus.    Conjunctiva/sclera: Conjunctivae normal.  Cardiovascular:     Rate and Rhythm: Normal rate and regular rhythm.  Pulmonary:     Effort: No respiratory distress.  Abdominal:     Tenderness: There is no abdominal tenderness. There is no guarding or rebound.  Musculoskeletal:        General: No swelling. Normal range of motion.  Neurological:     General: No focal deficit present.     Mental Status: She is alert and oriented to person, place, and time.     Cranial Nerves: No cranial nerve deficit.  Skin:    General: Skin is warm and dry.     Coloration: Skin is not jaundiced.  Psychiatric:        Mood and Affect: Mood normal.        Behavior: Behavior normal.        Judgment: Judgment normal.     Consults: None  Significant Findings/ Diagnostic Studies:  Lab Results  Component Value Date   APPEARANCEUR CLEAR (A) 09/27/2019   GLUCOSEU  50 (A) 09/27/2019   BILIRUBINUR NEGATIVE 09/27/2019   KETONESUR NEGATIVE 09/27/2019   LABSPEC 1.011 09/27/2019   HGBUR NEGATIVE 09/27/2019   PHURINE 6.0 09/27/2019   NITRITE NEGATIVE 09/27/2019   LEUKOCYTESUR TRACE (A) 09/27/2019     Procedures: NST NST Baseline FHR: 145 beats/min Variability: moderate Accelerations: present Decelerations: absent Tocometry: rare, mild ucs noted  Interpretation:  INDICATIONS: rule out uterine contractions RESULTS:  A NST procedure was performed with FHR monitoring and a normal baseline established, appropriate time of 20-40 minutes of evaluation, and accels >2 seen w 15x15 characteristics.  Results show a REACTIVE NST.    Hospital Course: The patient was admitted to Labor and Delivery Triage for observation. Normal VS, Reactive NST, no cervical change from last office visit.  Discharge Condition: good  Disposition: Discharge disposition: 01-Home or Self Care       Diet: Regular diet  Discharge Activity: Activity as tolerated   Allergies as of 09/27/2019      Reactions   Honeysuckle Flower [lonicera] Swelling, Rash, Other (See Comments)   Other Reaction: BREAK OUT, FACIAL SWELLING, WE   Amoxicillin Nausea And Vomiting   Did it involve swelling of the face/tongue/throat, SOB, or low BP? No Did it involve sudden or severe rash/hives, skin peeling, or any reaction on the inside of your mouth or nose? No Did you need to seek medical attention at a hospital or doctor's office? No When did it last happen?Can tolerate but gets an upset stomach If all above answers are "NO", may proceed with cephalosporin use.      Medication List    TAKE these medications   calcium carbonate 500 MG chewable tablet Commonly known as: TUMS - dosed in mg elemental calcium Chew 1 tablet by mouth 2 (two) times daily as needed for indigestion or heartburn.   CitraNatal Bloom 90-1 MG Tabs Take 1 tablet by mouth daily.   folic acid 1 MG  tablet Commonly known as: FOLVITE Take 1 tablet (1 mg total) by mouth daily.   pantoprazole 20 MG tablet Commonly known as: PROTONIX TAKE 1 TABLET BY MOUTH EVERY DAY AS NEEDED        Total time spent taking care of this patient: 45  minutes  Signed: Mirna Mires, CNM  09/27/2019, 5:26 PM

## 2019-09-27 NOTE — OB Triage Note (Signed)
Pt presents c/o ctx that started around 11. Denies LOF or bleeding. Denies recent intercourse. Reports positive fetal movement. VSS. Will continue to monitor.

## 2019-09-28 ENCOUNTER — Ambulatory Visit (INDEPENDENT_AMBULATORY_CARE_PROVIDER_SITE_OTHER): Payer: 59 | Admitting: Certified Nurse Midwife

## 2019-09-28 VITALS — BP 128/72 | Ht 67.0 in | Wt 260.6 lb

## 2019-09-28 DIAGNOSIS — R102 Pelvic and perineal pain: Secondary | ICD-10-CM | POA: Diagnosis not present

## 2019-09-28 DIAGNOSIS — O26893 Other specified pregnancy related conditions, third trimester: Secondary | ICD-10-CM

## 2019-09-28 DIAGNOSIS — Z3A37 37 weeks gestation of pregnancy: Secondary | ICD-10-CM | POA: Diagnosis not present

## 2019-09-28 LAB — POCT URINALYSIS DIPSTICK OB

## 2019-09-28 LAB — FETAL NONSTRESS TEST

## 2019-09-29 ENCOUNTER — Encounter: Payer: 59 | Admitting: Physical Therapy

## 2019-10-05 ENCOUNTER — Observation Stay
Admission: EM | Admit: 2019-10-05 | Discharge: 2019-10-05 | Disposition: A | Discharge status: Home / Self Care | Payer: 59 | Attending: Certified Nurse Midwife | Admitting: Certified Nurse Midwife

## 2019-10-05 ENCOUNTER — Encounter: Payer: Self-pay | Admitting: Obstetrics and Gynecology

## 2019-10-05 ENCOUNTER — Telehealth: Payer: Self-pay

## 2019-10-05 ENCOUNTER — Other Ambulatory Visit: Payer: Self-pay

## 2019-10-05 DIAGNOSIS — O2243 Hemorrhoids in pregnancy, third trimester: Principal | ICD-10-CM | POA: Insufficient documentation

## 2019-10-05 DIAGNOSIS — O0990 Supervision of high risk pregnancy, unspecified, unspecified trimester: Secondary | ICD-10-CM

## 2019-10-05 DIAGNOSIS — K644 Residual hemorrhoidal skin tags: Secondary | ICD-10-CM | POA: Diagnosis present

## 2019-10-05 DIAGNOSIS — M549 Dorsalgia, unspecified: Secondary | ICD-10-CM | POA: Diagnosis present

## 2019-10-05 DIAGNOSIS — Z3A38 38 weeks gestation of pregnancy: Secondary | ICD-10-CM | POA: Diagnosis not present

## 2019-10-05 DIAGNOSIS — O99210 Obesity complicating pregnancy, unspecified trimester: Secondary | ICD-10-CM

## 2019-10-05 DIAGNOSIS — O99213 Obesity complicating pregnancy, third trimester: Secondary | ICD-10-CM | POA: Diagnosis not present

## 2019-10-05 DIAGNOSIS — Z79899 Other long term (current) drug therapy: Secondary | ICD-10-CM | POA: Insufficient documentation

## 2019-10-05 DIAGNOSIS — O99343 Other mental disorders complicating pregnancy, third trimester: Secondary | ICD-10-CM | POA: Insufficient documentation

## 2019-10-05 DIAGNOSIS — Z3A37 37 weeks gestation of pregnancy: Secondary | ICD-10-CM

## 2019-10-05 DIAGNOSIS — F329 Major depressive disorder, single episode, unspecified: Secondary | ICD-10-CM | POA: Insufficient documentation

## 2019-10-05 DIAGNOSIS — O99891 Other specified diseases and conditions complicating pregnancy: Secondary | ICD-10-CM | POA: Diagnosis present

## 2019-10-05 DIAGNOSIS — Z6841 Body Mass Index (BMI) 40.0 and over, adult: Secondary | ICD-10-CM

## 2019-10-05 DIAGNOSIS — Z8759 Personal history of other complications of pregnancy, childbirth and the puerperium: Secondary | ICD-10-CM

## 2019-10-05 LAB — URINALYSIS, COMPLETE (UACMP) WITH MICROSCOPIC
Bilirubin Urine: NEGATIVE
Glucose, UA: NEGATIVE mg/dL
Hgb urine dipstick: NEGATIVE
Ketones, ur: NEGATIVE mg/dL
Nitrite: NEGATIVE
Protein, ur: NEGATIVE mg/dL
Specific Gravity, Urine: 1.015 (ref 1.005–1.030)
pH: 6 (ref 5.0–8.0)

## 2019-10-05 MED ORDER — PRAMOXINE-HC 1-1 % EX CREA: TOPICAL_CREAM | Freq: Three times a day (TID) | CUTANEOUS | 0 refills | Status: DC

## 2019-10-05 NOTE — Telephone Encounter (Signed)
Pt calling; knows she has a hemorrhoid; it has now protruded out; yesterday and today has had blood with wiping; not sure where it's coming from.  Today she doesn't feel good, hurting in back and lower stomach esp right side.  Seen today or go to hosp?  [phone]  Pt states she has good FM.  Adv since she doesn't know where the blood is coming from to go to L&D via ED.  Mary notified.

## 2019-10-05 NOTE — Discharge Instructions (Signed)
Hemorrhoids Hemorrhoids are swollen veins that may develop:  In the butt (rectum). These are called internal hemorrhoids.  Around the opening of the butt (anus). These are called external hemorrhoids. Hemorrhoids can cause pain, itching, or bleeding. Most of the time, they do not cause serious problems. They usually get better with diet changes, lifestyle changes, and other home treatments. What are the causes? This condition may be caused by:  Having trouble pooping (constipation).  Pushing hard (straining) to poop.  Watery poop (diarrhea).  Pregnancy.  Being very overweight (obese).  Sitting for long periods of time.  Heavy lifting or other activity that causes you to strain.  Anal sex.  Riding a bike for a long period of time. What are the signs or symptoms? Symptoms of this condition include:  Pain.  Itching or soreness in the butt.  Bleeding from the butt.  Leaking poop.  Swelling in the area.  One or more lumps around the opening of your butt. How is this diagnosed? A doctor can often diagnose this condition by looking at the affected area. The doctor may also:  Do an exam that involves feeling the area with a gloved hand (digital rectal exam).  Examine the area inside your butt using a small tube (anoscope).  Order blood tests. This may be done if you have lost a lot of blood.  Have you get a test that involves looking inside the colon using a flexible tube with a camera on the end (sigmoidoscopy or colonoscopy). How is this treated? This condition can usually be treated at home. Your doctor may tell you to change what you eat, make lifestyle changes, or try home treatments. If these do not help, procedures can be done to remove the hemorrhoids or make them smaller. These may involve:  Placing rubber bands at the base of the hemorrhoids to cut off their blood supply.  Injecting medicine into the hemorrhoids to shrink them.  Shining a type of light  energy onto the hemorrhoids to cause them to fall off.  Doing surgery to remove the hemorrhoids or cut off their blood supply. Follow these instructions at home: Eating and drinking   Eat foods that have a lot of fiber in them. These include whole grains, beans, nuts, fruits, and vegetables.  Ask your doctor about taking products that have added fiber (fibersupplements).  Reduce the amount of fat in your diet. You can do this by: ? Eating low-fat dairy products. ? Eating less red meat. ? Avoiding processed foods.  Drink enough fluid to keep your pee (urine) pale yellow. Managing pain and swelling   Take a warm-water bath (sitz bath) for 20 minutes to ease pain. Do this 3-4 times a day. You may do this in a bathtub or using a portable sitz bath that fits over the toilet.  If told, put ice on the painful area. It may be helpful to use ice between your warm baths. ? Put ice in a plastic bag. ? Place a towel between your skin and the bag. ? Leave the ice on for 20 minutes, 2-3 times a day. General instructions  Take over-the-counter and prescription medicines only as told by your doctor. ? Medicated creams and medicines may be used as told.  Exercise often. Ask your doctor how much and what kind of exercise is best for you.  Go to the bathroom when you have the urge to poop. Do not wait.  Avoid pushing too hard when you poop.  Keep your   butt dry and clean. Use wet toilet paper or moist towelettes after pooping.  Do not sit on the toilet for a long time.  Keep all follow-up visits as told by your doctor. This is important. Contact a doctor if you:  Have pain and swelling that do not get better with treatment or medicine.  Have trouble pooping.  Cannot poop.  Have pain or swelling outside the area of the hemorrhoids. Get help right away if you have:  Bleeding that will not stop. Summary  Hemorrhoids are swollen veins in the butt or around the opening of the  butt.  They can cause pain, itching, or bleeding.  Eat foods that have a lot of fiber in them. These include whole grains, beans, nuts, fruits, and vegetables.  Take a warm-water bath (sitz bath) for 20 minutes to ease pain. Do this 3-4 times a day. This information is not intended to replace advice given to you by your health care provider. Make sure you discuss any questions you have with your health care provider. Document Revised: 03/19/2018 Document Reviewed: 07/31/2017 Elsevier Patient Education  2020 Elsevier Inc.  

## 2019-10-05 NOTE — Final Progress Note (Signed)
Physician Final Progress Note  Patient ID: Isabel Higgins MRN: [id number] DOB/AGE: [birth year] 26 y.o.  Admit date: 10/05/2019 Admitting provider: Conard Novak, MD/ Gasper Lloyd. Sharen Hones, CNM Discharge date: 10/05/2019   Admission Diagnoses: IUP at 38 weeks 4 days with rectal vs vaginal bleeding.   Discharge Diagnoses:  Active Problems:   Back pain affecting pregnancy in third trimester   Bleeding external hemorrhoids   Consults: None  Significant Findings/ Diagnostic Studies:  OB History & Physical   History of Present Illness:  Chief Complaint:   HPI:  Isabel Higgins is a 24 y.o. G71P2002 female with EDC=10/15/2019 at 38wk4d dated by 8 week ultrasound.  Her pregnancy has been complicated by morbid obesity, pelvic and back pain since mid second trimester (treated by PT), history of macrosomia with first baby (9#13 oz at 37 weeks), history of gestational hypertension with G1, and history of depression. .  She presents to L&D for evaluation of bleeding. She has been having contractions and lower abdominal pain. She has also been having bleeding today when wiping. Is not sure whether the bleeding is coming from her hemorrhoids or vagina. Good FM. No blood on underwear.   Has been scheduled for an elective induction on 7/16. Prenatal care site: Prenatal care at [facility] has also been remarkable for  [facility] Prenatal Labs  Dating 8 week [place] on 03/05/19 Blood type: A/Positive/-- (11/17 1008)   Genetic Screen     NIPS:nml XY Antibody:Negative (11/17 1008)  Anatomic [place] [facility] nml Rubella: 1.20 (11/17 1008) Varicella: Immune  GTT Early: 129 Third trimester: 139 RPR: Non Reactive (11/17 1008)   Rhogam n/a HBsAg: Negative (11/17 1008)   TDaP vaccine      6/1                 Flu Shot:1/29 HIV: Non Reactive (11/17 1008)   Baby Food         Breast                       GBS: negative  Contraception Mirena Pap:08/2018  CBB  No   CS/VBAC n/a   Support Person           Maternal Medical History:   Past Medical History:  Diagnosis Date  . Bradycardia   . Depression   . GERD (gastroesophageal reflux disease)   . Hemorrhoids during pregnancy in third trimester   . Menometrorrhagia 08/07/2016  . Mental disorder   . Morbid obesity (HCC)   . Psoriasis     Past Surgical History:  Procedure Laterality Date  . INNER EAR SURGERY     right  . LAPAROSCOPY N/A 11/19/2018   Procedure: LAPAROSCOPY DIAGNOSTIC AND BIOPSY;  Surgeon: Nadara Mustard, MD;  Location: [facility] ORS;  Service: Gynecology;  Laterality: N/A;  . SHOULDER SURGERY     right    Allergies  Allergen Reactions  . Honeysuckle Flower [Lonicera] Swelling, Rash and Other (See Comments)    Other Reaction: BREAK OUT, FACIAL SWELLING, WE  . Amoxicillin Nausea And Vomiting    Did it involve swelling of the face/tongue/throat, SOB, or low BP? No Did it involve sudden or severe rash/hives, skin peeling, or any reaction on the inside of your mouth or nose? No Did you need to seek medical attention at a hospital or doctor's office? No When did it last happen?Can tolerate but gets an upset stomach If all above answers are "NO", may proceed with cephalosporin  use.     Prior to Admission medications   Medication Sig Start Date End Date Taking? Authorizing Provider  calcium carbonate (TUMS - DOSED IN MG ELEMENTAL CALCIUM) 500 MG chewable tablet Chew 1 tablet by mouth 2 (two) times daily as needed for indigestion or heartburn.   Yes [provider]  folic acid (FOLVITE) 1 MG tablet Take 1 tablet (1 mg total) by mouth daily. 02/09/19  Yes Vena Austria, MD  pantoprazole (PROTONIX) 20 MG tablet TAKE 1 TABLET BY MOUTH EVERY DAY AS NEEDED 08/19/19  Yes Nadara Mustard, MD  Prenatal-DSS-FeCb-FeGl-FA (CITRANATAL BLOOM) 90-1 MG TABS Take 1 tablet by mouth daily. 03/10/19  Yes Vena Austria, MD       Farrel Conners, CNM  sertraline (ZOLOFT) 50 MG tablet Take 1 tablet (50 mg total) by  mouth daily. 10/10/19 10/09/20  Mirna Mires, CNM     Social History: She  reports that she has never smoked. She has never used smokeless tobacco. She reports that she does not drink alcohol and does not use drugs.  Family History: family history includes Diabetes in her maternal grandfather and paternal grandfather; Heart attack in her maternal grandmother; Hypertension in her father.   Review of Systems: Negative x 10 systems reviewed except as noted in the HPI.      Physical Exam:  Vital Signs: BP 136/85 (BP Location: Right Arm)   Pulse (!) 108   Temp 98.7 F (37.1 C) (Oral)   Resp 17   LMP 01/13/2019 (Exact Date)  General: no acute distress.  HEENT: normocephalic, atraumatic Lungs: normal respiratory effort Abdomen: soft, gravid, non-tender;   Pelvic:   External: Normal external female genitalia  Vagina: no blood in vault  Cervix: 3.5/60%/-2/cephalic. (no change)  Rectum: external hemorrhoid at 12 o'clock with scab where  it had been bleeding  Extremities: non-tender, symmetric, trace edema bilaterally.    Neurologic: Alert & oriented x 3.   Baseline FHR: baseline 135 with accelerations to 160s, moderate variability Toco: contractions initially mild to palpation and every 3 minutes. With rest contractions spaced out to 7-11 minutes apart.     Assessment:  Isabel Higgins is a 24 y.o. [phone] female at 45wk4d with bleeding external hemorrhoid Not in labor Cat 1 tracing RX for Analpram 1% sent to pharmacy. Apply to hemorrhoids qid. Tucks pads, sitz baths. Labor precautions Follow up in office as scheduled.   Procedures: none  Discharge Condition: stable  Disposition: Discharge disposition: 01-Home or Self Care       Diet: Regular diet  Discharge Activity: Activity as tolerated  Discharge Instructions    Discharge patient   Complete by: As directed    Discharge disposition: 01-Home or Self Care   Discharge patient date: 10/05/2019     Allergies as  of 10/05/2019      Reactions   Honeysuckle Flower [lonicera] Swelling, Rash, Other (See Comments)   Other Reaction: BREAK OUT, FACIAL SWELLING, WE   Amoxicillin Nausea And Vomiting   Did it involve swelling of the face/tongue/throat, SOB, or low BP? No Did it involve sudden or severe rash/hives, skin peeling, or any reaction on the inside of your mouth or nose? No Did you need to seek medical attention at a hospital or doctor's office? No When did it last happen?Can tolerate but gets an upset stomach If all above answers are "NO", may proceed with cephalosporin use.      Medication List    TAKE these medications   calcium carbonate  500 MG chewable tablet Commonly known as: TUMS - dosed in mg elemental calcium Chew 1 tablet by mouth 2 (two) times daily as needed for indigestion or heartburn.   CitraNatal Bloom 90-1 MG Tabs Take 1 tablet by mouth daily.   folic acid 1 MG tablet Commonly known as: FOLVITE Take 1 tablet (1 mg total) by mouth daily.   pantoprazole 20 MG tablet Commonly known as: PROTONIX TAKE 1 TABLET BY MOUTH EVERY DAY AS NEEDED   pramoxine-hydrocortisone cream Commonly known as: ANALPRAM HC Apply topically 3 (three) times daily.        Total time spent taking care of this patient: 20 minutes  Signed: Farrel Conners 10/05/2019, 1:25 PM

## 2019-10-06 ENCOUNTER — Ambulatory Visit (INDEPENDENT_AMBULATORY_CARE_PROVIDER_SITE_OTHER): Payer: 59 | Admitting: Obstetrics

## 2019-10-06 ENCOUNTER — Other Ambulatory Visit
Admission: RE | Admit: 2019-10-06 | Discharge: 2019-10-06 | Disposition: A | Discharge status: Home / Self Care | Payer: 59 | Source: Ambulatory Visit | Attending: Certified Nurse Midwife | Admitting: Certified Nurse Midwife

## 2019-10-06 ENCOUNTER — Encounter: Payer: 59 | Admitting: Physical Therapy

## 2019-10-06 VITALS — BP 126/74 | Wt 262.0 lb

## 2019-10-06 DIAGNOSIS — Z3A38 38 weeks gestation of pregnancy: Secondary | ICD-10-CM

## 2019-10-06 DIAGNOSIS — O0990 Supervision of high risk pregnancy, unspecified, unspecified trimester: Secondary | ICD-10-CM

## 2019-10-06 LAB — SARS CORONAVIRUS 2 (TAT 6-24 HRS): SARS Coronavirus 2: NEGATIVE

## 2019-10-06 NOTE — Progress Notes (Signed)
No vb. No lof. Pt went to hospital yesterday thinking she was bleeding, but thinking it was coming from hemorrhoids.

## 2019-10-06 NOTE — Progress Notes (Signed)
Routine Prenatal Care Visit  Subjective  Isabel Higgins is a 24 y.o. G2P1001 at [redacted]w[redacted]d being seen today for ongoing prenatal care.  She is currently monitored for the following issues for this high-risk pregnancy and has POTS (postural orthostatic tachycardia syndrome); Pelvic pain; Abnormal uterine bleeding; Dysmenorrhea; Endosalpingiosis; History of gestational hypertension; Maternal obesity, antepartum; Supervision of high risk pregnancy, antepartum; Adult BMI 40.0-44.9 kg/sq m (HCC); Back pain affecting pregnancy in third trimester; Rectocele affecting obstetric care in second trimester; Syncope; Headache in pregnancy; [redacted] weeks gestation of pregnancy; and Bleeding external hemorrhoids on their problem list.  ----------------------------------------------------------------------------------- Patient reports bleeding, occasional contractions and she was seen on labor and delivery the other day. She has an IOL set up for two days from now.    . Vag. Bleeding: None.  Movement: Present. Leaking Fluid denies.  ----------------------------------------------------------------------------------- The following portions of the patient's history were reviewed and updated as appropriate: allergies, current medications, past family history, past medical history, past social history, past surgical history and problem list. Problem list updated.  Objective  Blood pressure 126/74, weight 262 lb (118.8 kg), last menstrual period 01/13/2019, currently breastfeeding. Pregravid weight 231 lb (104.8 kg) Total Weight Gain 31 lb (14.1 kg) Urinalysis: Urine Protein    Urine Glucose    Fetal Status:     Movement: Present     General:  Alert, oriented and cooperative. Patient is in no acute distress.  Skin: Skin is warm and dry. No rash noted.   Cardiovascular: Normal heart rate noted  Respiratory: Normal respiratory effort, no problems with respiration noted  Abdomen: Soft, gravid, appropriate for gestational age.  Pain/Pressure: Present     Pelvic:  Cervical exam performed        Extremities: Normal range of motion.     Mental Status: Normal mood and affect. Normal behavior. Normal judgment and thought content.   Assessment   24 y.o. G2P1001 at [redacted]w[redacted]d by  10/15/2019, by Ultrasound presenting for routine prenatal visit  Plan   Pregnancy#2 Problems (from 01/13/19 to present)    Problem Noted Resolved   Adult BMI 40.0-44.9 kg/sq m (HCC) 03/29/2019 by Conard Novak, MD No   History of gestational hypertension 02/09/2019 by Vena Austria, MD No   Maternal obesity, antepartum 02/09/2019 by Vena Austria, MD No   Supervision of high risk pregnancy, antepartum 02/09/2019 by Vena Austria, MD No   Overview Addendum 09/27/2019  3:05 PM by Mirna Mires, CNM    [facility] Prenatal Labs  Dating 8 week [place] on 03/05/19 Blood type: A/Positive/-- (11/17 1008)   Genetic Screen     NIPS:nml XY Antibody:Negative (11/17 1008)  Anatomic [place] [facility] nml Rubella: 1.20 (11/17 1008) Varicella: Immune  GTT Early: 129 Third trimester: 139 RPR: Non Reactive (11/17 1008)   Rhogam n/a HBsAg: Negative (11/17 1008)   TDaP vaccine      6/1                 Flu Shot:1/29 HIV: Non Reactive (11/17 1008)   Baby Food         Breast                       GBS:   Contraception Mirena Pap:08/2018  CBB  No   CS/VBAC n/a   Support Person           Previous Version       Term labor symptoms and general obstetric precautions including but not limited to  vaginal bleeding, contractions, leaking of fluid and fetal movement were reviewed in detail with the patient. Please refer to After Visit Summary for other counseling recommendations.  With her consent, her cervix is swept gently to stimulate labor.   No returnappt set up, as she is to be induced in several days.  Mirna Mires, CNM  10/06/2019 2:37 PM

## 2019-10-07 LAB — URINE CULTURE: Special Requests: NORMAL

## 2019-10-08 ENCOUNTER — Observation Stay: Payer: 59 | Admitting: Anesthesiology

## 2019-10-08 ENCOUNTER — Other Ambulatory Visit: Payer: Self-pay

## 2019-10-08 ENCOUNTER — Encounter: Payer: Self-pay | Admitting: Obstetrics and Gynecology

## 2019-10-08 ENCOUNTER — Inpatient Hospital Stay
Admission: RE | Admit: 2019-10-08 | Discharge: 2019-10-10 | DRG: 806 | Disposition: A | Discharge status: Home / Self Care | Payer: 59 | Attending: Certified Nurse Midwife | Admitting: Certified Nurse Midwife

## 2019-10-08 DIAGNOSIS — O99214 Obesity complicating childbirth: Principal | ICD-10-CM | POA: Diagnosis present

## 2019-10-08 DIAGNOSIS — Z6841 Body Mass Index (BMI) 40.0 and over, adult: Secondary | ICD-10-CM

## 2019-10-08 DIAGNOSIS — O9081 Anemia of the puerperium: Secondary | ICD-10-CM | POA: Diagnosis not present

## 2019-10-08 DIAGNOSIS — Z3490 Encounter for supervision of normal pregnancy, unspecified, unspecified trimester: Secondary | ICD-10-CM | POA: Diagnosis present

## 2019-10-08 DIAGNOSIS — O0990 Supervision of high risk pregnancy, unspecified, unspecified trimester: Secondary | ICD-10-CM

## 2019-10-08 DIAGNOSIS — Z3A39 39 weeks gestation of pregnancy: Secondary | ICD-10-CM

## 2019-10-08 DIAGNOSIS — Z8759 Personal history of other complications of pregnancy, childbirth and the puerperium: Secondary | ICD-10-CM

## 2019-10-08 DIAGNOSIS — Z20822 Contact with and (suspected) exposure to covid-19: Secondary | ICD-10-CM | POA: Diagnosis present

## 2019-10-08 DIAGNOSIS — Z880 Allergy status to penicillin: Secondary | ICD-10-CM

## 2019-10-08 DIAGNOSIS — D62 Acute posthemorrhagic anemia: Secondary | ICD-10-CM | POA: Diagnosis not present

## 2019-10-08 DIAGNOSIS — O99210 Obesity complicating pregnancy, unspecified trimester: Secondary | ICD-10-CM

## 2019-10-08 HISTORY — DX: Morbid (severe) obesity due to excess calories: E66.01

## 2019-10-08 HISTORY — DX: Hemorrhoids in pregnancy, third trimester: O22.43

## 2019-10-08 LAB — CBC
HCT: 34.3 % — ABNORMAL LOW (ref 36.0–46.0)
Hemoglobin: 11.9 g/dL — ABNORMAL LOW (ref 12.0–15.0)
MCH: 29 pg (ref 26.0–34.0)
MCHC: 34.7 g/dL (ref 30.0–36.0)
MCV: 83.7 fL (ref 80.0–100.0)
Platelets: 149 10*3/uL — ABNORMAL LOW (ref 150–400)
RBC: 4.1 MIL/uL (ref 3.87–5.11)
RDW: 15.1 % (ref 11.5–15.5)
WBC: 9.5 10*3/uL (ref 4.0–10.5)
nRBC: 0 % (ref 0.0–0.2)

## 2019-10-08 LAB — TYPE AND SCREEN
ABO/RH(D): A POS
Antibody Screen: NEGATIVE

## 2019-10-08 LAB — RPR: RPR Ser Ql: NONREACTIVE

## 2019-10-08 MED ORDER — OXYTOCIN BOLUS FROM INFUSION
333.0000 mL | Freq: Once | INTRAVENOUS | Status: AC
  Administered 2019-10-09: 333 mL via INTRAVENOUS

## 2019-10-08 MED ORDER — ONDANSETRON HCL 4 MG/2ML IJ SOLN
4.0000 mg | Freq: Four times a day (QID) | INTRAMUSCULAR | Status: DC | PRN
  Administered 2019-10-08 (×2): 4 mg via INTRAVENOUS
  Filled 2019-10-08 (×2): qty 2

## 2019-10-08 MED ORDER — AMMONIA AROMATIC IN INHA
0.3000 mL | Freq: Once | RESPIRATORY_TRACT | Status: DC | PRN
  Filled 2019-10-08: qty 10

## 2019-10-08 MED ORDER — LIDOCAINE HCL (PF) 1 % IJ SOLN
30.0000 mL | INTRAMUSCULAR | Status: DC | PRN
  Filled 2019-10-08 (×2): qty 30

## 2019-10-08 MED ORDER — FENTANYL 2.5 MCG/ML W/ROPIVACAINE 0.15% IN NS 100 ML EPIDURAL (ARMC)
EPIDURAL | Status: AC
  Filled 2019-10-08: qty 100

## 2019-10-08 MED ORDER — BUPIVACAINE HCL (PF) 0.25 % IJ SOLN
INTRAMUSCULAR | Status: DC | PRN
  Administered 2019-10-08 (×2): 4 mL via EPIDURAL

## 2019-10-08 MED ORDER — LIDOCAINE HCL (PF) 1 % IJ SOLN
INTRAMUSCULAR | Status: DC | PRN
  Administered 2019-10-08: 3 mL

## 2019-10-08 MED ORDER — PHENYLEPHRINE 40 MCG/ML (10ML) SYRINGE FOR IV PUSH (FOR BLOOD PRESSURE SUPPORT)
80.0000 ug | PREFILLED_SYRINGE | INTRAVENOUS | Status: DC | PRN
  Filled 2019-10-08: qty 10

## 2019-10-08 MED ORDER — OXYTOCIN-SODIUM CHLORIDE 30-0.9 UT/500ML-% IV SOLN
1.0000 m[IU]/min | INTRAVENOUS | Status: DC
  Administered 2019-10-08: 2 m[IU]/min via INTRAVENOUS
  Filled 2019-10-08: qty 500

## 2019-10-08 MED ORDER — LACTATED RINGERS IV SOLN: 500.0000 mL | Freq: Once | INTRAVENOUS | Status: DC

## 2019-10-08 MED ORDER — EPHEDRINE 5 MG/ML INJ
10.0000 mg | INTRAVENOUS | Status: DC | PRN
  Filled 2019-10-08: qty 2

## 2019-10-08 MED ORDER — FENTANYL CITRATE (PF) 100 MCG/2ML IJ SOLN: 50.0000 ug | INTRAMUSCULAR | Status: DC | PRN

## 2019-10-08 MED ORDER — LACTATED RINGERS IV SOLN: 500.0000 mL | INTRAVENOUS | Status: DC | PRN

## 2019-10-08 MED ORDER — MISOPROSTOL 25 MCG QUARTER TABLET
25.0000 ug | ORAL_TABLET | Freq: Once | ORAL | Status: AC
  Administered 2019-10-08: 25 ug via VAGINAL

## 2019-10-08 MED ORDER — LACTATED RINGERS IV SOLN: INTRAVENOUS | Status: DC

## 2019-10-08 MED ORDER — FENTANYL 2.5 MCG/ML W/ROPIVACAINE 0.15% IN NS 100 ML EPIDURAL (ARMC)
12.0000 mL/h | EPIDURAL | Status: DC
  Administered 2019-10-08 (×2): 12 mL/h via EPIDURAL

## 2019-10-08 MED ORDER — LIDOCAINE-EPINEPHRINE (PF) 1.5 %-1:200000 IJ SOLN
INTRAMUSCULAR | Status: DC | PRN
  Administered 2019-10-08: 3 mL via PERINEURAL

## 2019-10-08 MED ORDER — TERBUTALINE SULFATE 1 MG/ML IJ SOLN: 0.2500 mg | Freq: Once | INTRAMUSCULAR | Status: DC | PRN

## 2019-10-08 MED ORDER — OXYTOCIN-SODIUM CHLORIDE 30-0.9 UT/500ML-% IV SOLN
2.5000 [IU]/h | INTRAVENOUS | Status: DC
  Administered 2019-10-09: 2.5 [IU]/h via INTRAVENOUS
  Filled 2019-10-08 (×2): qty 500

## 2019-10-08 MED ORDER — DIPHENHYDRAMINE HCL 50 MG/ML IJ SOLN: 12.5000 mg | INTRAMUSCULAR | Status: DC | PRN

## 2019-10-08 MED ORDER — MISOPROSTOL 200 MCG PO TABS
800.0000 ug | ORAL_TABLET | Freq: Once | ORAL | Status: DC | PRN
  Filled 2019-10-08: qty 4

## 2019-10-08 MED ORDER — MISOPROSTOL 100 MCG PO TABS
25.0000 ug | ORAL_TABLET | ORAL | Status: DC | PRN
  Filled 2019-10-08 (×2): qty 1

## 2019-10-08 MED ORDER — MISOPROSTOL 25 MCG QUARTER TABLET
ORAL_TABLET | ORAL | Status: AC
  Filled 2019-10-08: qty 1

## 2019-10-08 NOTE — Anesthesia Procedure Notes (Addendum)
Epidural Patient location during procedure: OB Start time: 10/08/2019 4:28 PM End time: 10/08/2019 4:48 PM  Staffing Anesthesiologist: Lenard Simmer, MD Resident/CRNA: Irving Burton, CRNA Performed: resident/CRNA   Preanesthetic Checklist Completed: patient identified, IV checked, site marked, risks and benefits discussed, surgical consent, monitors and equipment checked, pre-op evaluation and timeout performed  Epidural Patient position: sitting Prep: ChloraPrep Patient monitoring: heart rate, continuous pulse ox and blood pressure Approach: midline Location: L3-L4 Injection technique: LOR air  Needle:  Needle type: Tuohy  Needle gauge: 17 G Needle length: 9 cm and 9 Needle insertion depth: 8 cm Catheter type: closed end flexible Catheter size: 19 Gauge Catheter at skin depth: 13 cm Test dose: negative and 1.5% lidocaine with Epi 1:200 K  Assessment Events: blood not aspirated, injection not painful, no injection resistance, no paresthesia and negative IV test  Additional Notes 2 attempt Pt. Evaluated and documentation done after procedure finished. Patient identified. Risks/Benefits/Options discussed with patient including but not limited to bleeding, infection, nerve damage, paralysis, failed block, incomplete pain control, headache, blood pressure changes, nausea, vomiting, reactions to medication both or allergic, itching and postpartum back pain. Confirmed with bedside nurse the patient's most recent platelet count. Confirmed with patient that they are not currently taking any anticoagulation, have any bleeding history or any family history of bleeding disorders. Patient expressed understanding and wished to proceed. All questions were answered. Sterile technique was used throughout the entire procedure. Please see nursing notes for vital signs. Test dose was given through epidural catheter and negative prior to continuing to dose epidural or start infusion. Warning  signs of high block given to the patient including shortness of breath, tingling/numbness in hands, complete motor block, or any concerning symptoms with instructions to call for help. Patient was given instructions on fall risk and not to get out of bed. All questions and concerns addressed with instructions to call with any issues or inadequate analgesia.   Patient tolerated the insertion well without immediate complications.Reason for block:procedure for pain

## 2019-10-08 NOTE — H&P (Signed)
OB History & Physical   History of Present Illness:  Chief Complaint:  Here for an induction of labor HPI:  TYAUNA LACAZE is a 24 y.o. G76P1001 female with EDC=10/15/2019 at [redacted]w[redacted]d dated by an 8 week ultrasound.  Her pregnancy has been complicated by morbid obesity (current BMI 41 kg/m2), pelvic and back pain since mid second trimester (received PT), hx of macrosomia with first baby (9#13oz at 37 weeks)), hx of gestational hypertension with G1, hx of depression, rectocele and bleeding hemorrhoids.  .  She presents to L&D for elective induction of labor  Contractions are irregular and she has been seen several times in L&D for labor symptoms. No vaginal bleeding or leakage of fluid. Her last exam was 3cm dilated. EFW on 09/23/2019 was 8#4oz. Antepartum testing has been reassuring.  Prenatal care site: Prenatal care at [facility] has been remarkable for  [facility] Prenatal Labs  Dating 8 week [place] on 03/05/19 Blood type: A/Positive/-- (11/17 1008)   Genetic Screen     NIPS:nml XY Antibody:Negative (11/17 1008)  Anatomic [place] [facility] nml Rubella: 1.20 (11/17 1008) Varicella: Immune  GTT Early: 129 Third trimester: 139 RPR: Non Reactive (11/17 1008)   Rhogam n/a HBsAg: Negative (11/17 1008)   TDaP vaccine      6/1                 Flu Shot:1/29 HIV: Non Reactive (11/17 1008)   Baby Food         Breast                       GBS: negative  Contraception Mirena Pap:08/2018  CBB  No   CS/VBAC n/a   Support Person          Maternal Medical History:   Past Medical History:  Diagnosis Date  . Bradycardia   . Depression   . GERD (gastroesophageal reflux disease)   . Hemorrhoids during pregnancy in third trimester   . Menometrorrhagia 08/07/2016  . Mental disorder   . Morbid obesity (HCC)   . Psoriasis     Past Surgical History:  Procedure Laterality Date  . INNER EAR SURGERY     right  . LAPAROSCOPY N/A 11/19/2018   Procedure: LAPAROSCOPY DIAGNOSTIC AND BIOPSY;  Surgeon: Nadara Mustard, MD;  Location: [facility] ORS;  Service: Gynecology;  Laterality: N/A;  . SHOULDER SURGERY     right    Allergies  Allergen Reactions  . Honeysuckle Flower [Lonicera] Swelling, Rash and Other (See Comments)    Other Reaction: BREAK OUT, FACIAL SWELLING, WE  . Amoxicillin Nausea And Vomiting    Did it involve swelling of the face/tongue/throat, SOB, or low BP? No Did it involve sudden or severe rash/hives, skin peeling, or any reaction on the inside of your mouth or nose? No Did you need to seek medical attention at a hospital or doctor's office? No When did it last happen?Can tolerate but gets an upset stomach If all above answers are "NO", may proceed with cephalosporin use.     Prior to Admission medications   Medication Sig Start Date End Date Taking? Authorizing Provider  calcium carbonate (TUMS - DOSED IN MG ELEMENTAL CALCIUM) 500 MG chewable tablet Chew 1 tablet by mouth 2 (two) times daily as needed for indigestion or heartburn.    [provider]  folic acid (FOLVITE) 1 MG tablet Take 1 tablet (1 mg total) by mouth daily. 02/09/19   Staebler,  Andreas, MD  pantoprazole (PROTONIX) 20 MG tablet TAKE 1 TABLET BY MOUTH EVERY DAY AS NEEDED 08/19/19   Nadara Mustard, MD  pramoxine-hydrocortisone [facility]) cream Apply topically 3 (three) times daily. 10/05/19   Farrel Conners, CNM  Prenatal-DSS-FeCb-FeGl-FA (CITRANATAL BLOOM) 90-1 MG TABS Take 1 tablet by mouth daily. 03/10/19   Vena Austria, MD          Social History: She  reports that she has never smoked. She has never used smokeless tobacco. She reports that she does not drink alcohol and does not use drugs.  Family History: family history includes Diabetes in her maternal grandfather and paternal grandfather; Heart attack in her maternal grandmother; Hypertension in her father.   Review of Systems: Negative x 10 systems reviewed except as noted in the HPI.      Physical Exam:  Vital  Signs: BP 129/76 (BP Location: Left Arm)   Temp 98.6 F (37 C) (Oral)   Resp 18   Ht 5\' 7"  (1.702 m)   Wt 118.8 kg   LMP 01/13/2019 (Exact Date)   BMI 41.04 kg/m  General: gravid WF in no acute distress.  HEENT: normocephalic, atraumatic Heart: regular rate & rhythm.  No murmurs/rubs/gallops Lungs: clear to auscultation bilaterally Abdomen: soft, gravid, non-tender;  EFW: 9#4oz Pelvic:   External: Normal external female genitalia  Cervix: 3/thick/ 60%/-1 to -2  Extremities: non-tender, symmetric, trace edema bilaterally.   DTRs: +1  Neurologic: Alert & oriented x 3.   Baseline FHR: baseline 140 withy accelerations to 150s to 160s, moderate variability, no decelerations Toco: one contraction seen  Assessment:  ANISHKA BUSHARD is a 24 y.o. G49P1001 female at [redacted]w[redacted]d presents an elective IOL Bishop score: 7 Plan:  1. Admit to Labor & Delivery  2. CBC, T&S, RPR, Clrs, IVF 3. A POS/ RI/ VI/ GBS negative.   4. Discussed risks of induction of labor including hyperstimulation, failure to progress, fetal intolerance to labor, Cesarean section. She verbalizes understanding and wishes to proceed. Will start with Cytotec. 5. IV analgesics if desires, epidural when appropriate if desires 6. Mirena/ Breast 7. TDAP given AP  [redacted]w[redacted]d  10/08/2019 8:37 AM

## 2019-10-08 NOTE — Progress Notes (Signed)
Pt arrived to LDR 3 from ED for scheduled Induction of labor.

## 2019-10-08 NOTE — Progress Notes (Signed)
L&D Progress Note   S: Comfortable after epidural  O: BP 113/66 (BP Location: Left Arm)   Pulse 94   Temp 98.8 F (37.1 C) (Oral)   Resp 14   Ht 5\' 7"  (1.702 m)   Wt 118.8 kg   LMP 01/13/2019 (Exact Date)   SpO2 97%   BMI 41.04 kg/m    General: appears comfortable  FHR: 140 baseline with accelerations to 160s, moderate variability with some frequent variable decelerations after some contractions to 110s to 120s x 20 seconds. Toco: every 2-3 minutes apart on 10 miu/min Pitocin.  Cervix: 5/75%/-1 to -2 Ultrasound: ROT position  A: IOL in progress Overall reassuring fetal heart tracing  P: AROM: moderate pink tinged fluid Position change Continue to follow progress and fetal well being.  01/15/2019, CNM

## 2019-10-08 NOTE — Anesthesia Preprocedure Evaluation (Signed)
Anesthesia Evaluation  Patient identified by MRN, date of birth, ID band Patient awake    Reviewed: Allergy & Precautions, H&P , NPO status , Patient's Chart, lab work & pertinent test results  Airway Mallampati: II   Neck ROM: full    Dental  (+) Dental Advidsory Given   Pulmonary neg pulmonary ROS,           Cardiovascular negative cardio ROS       Neuro/Psych  Headaches, PSYCHIATRIC DISORDERS Depression    GI/Hepatic Neg liver ROS, GERD  ,  Endo/Other  negative endocrine ROS  Renal/GU negative Renal ROS  negative genitourinary   Musculoskeletal   Abdominal   Peds  Hematology negative hematology ROS (+)   Anesthesia Other Findings   Reproductive/Obstetrics (+) Pregnancy                             Anesthesia Physical Anesthesia Plan  ASA: II  Anesthesia Plan: Epidural   Post-op Pain Management:    Induction:   PONV Risk Score and Plan:   Airway Management Planned:   Additional Equipment:   Intra-op Plan:   Post-operative Plan:   Informed Consent: I have reviewed the patients History and Physical, chart, labs and discussed the procedure including the risks, benefits and alternatives for the proposed anesthesia with the patient or authorized representative who has indicated his/her understanding and acceptance.     Dental Advisory Given  Plan Discussed with: Anesthesiologist and CRNA  Anesthesia Plan Comments:         Anesthesia Quick Evaluation

## 2019-10-09 ENCOUNTER — Encounter: Payer: Self-pay | Admitting: Obstetrics and Gynecology

## 2019-10-09 DIAGNOSIS — Z880 Allergy status to penicillin: Secondary | ICD-10-CM | POA: Diagnosis not present

## 2019-10-09 DIAGNOSIS — Z20822 Contact with and (suspected) exposure to covid-19: Secondary | ICD-10-CM | POA: Diagnosis present

## 2019-10-09 DIAGNOSIS — Z3490 Encounter for supervision of normal pregnancy, unspecified, unspecified trimester: Secondary | ICD-10-CM | POA: Diagnosis not present

## 2019-10-09 DIAGNOSIS — D62 Acute posthemorrhagic anemia: Secondary | ICD-10-CM | POA: Diagnosis not present

## 2019-10-09 DIAGNOSIS — O26893 Other specified pregnancy related conditions, third trimester: Secondary | ICD-10-CM | POA: Diagnosis present

## 2019-10-09 DIAGNOSIS — Z3A39 39 weeks gestation of pregnancy: Secondary | ICD-10-CM

## 2019-10-09 DIAGNOSIS — O99214 Obesity complicating childbirth: Principal | ICD-10-CM

## 2019-10-09 DIAGNOSIS — O9081 Anemia of the puerperium: Secondary | ICD-10-CM | POA: Diagnosis not present

## 2019-10-09 DIAGNOSIS — O0990 Supervision of high risk pregnancy, unspecified, unspecified trimester: Secondary | ICD-10-CM | POA: Diagnosis not present

## 2019-10-09 MED ORDER — SIMETHICONE 80 MG PO CHEW: 80.0000 mg | CHEWABLE_TABLET | ORAL | Status: DC | PRN

## 2019-10-09 MED ORDER — IBUPROFEN 600 MG PO TABS
600.0000 mg | ORAL_TABLET | Freq: Four times a day (QID) | ORAL | Status: DC
  Administered 2019-10-09 – 2019-10-10 (×6): 600 mg via ORAL
  Filled 2019-10-09 (×6): qty 1

## 2019-10-09 MED ORDER — SENNOSIDES-DOCUSATE SODIUM 8.6-50 MG PO TABS
2.0000 | ORAL_TABLET | ORAL | Status: DC
  Administered 2019-10-10: 2 via ORAL
  Filled 2019-10-09: qty 2

## 2019-10-09 MED ORDER — FAMOTIDINE 20 MG PO TABS: 20.0000 mg | ORAL_TABLET | Freq: Two times a day (BID) | ORAL | Status: DC | PRN

## 2019-10-09 MED ORDER — METHYLERGONOVINE MALEATE 0.2 MG/ML IJ SOLN
INTRAMUSCULAR | Status: AC
  Administered 2019-10-09: 0.2 mg
  Filled 2019-10-09: qty 1

## 2019-10-09 MED ORDER — ONDANSETRON HCL 4 MG/2ML IJ SOLN
4.0000 mg | INTRAMUSCULAR | Status: DC | PRN
  Administered 2019-10-09: 4 mg via INTRAVENOUS
  Filled 2019-10-09: qty 2

## 2019-10-09 MED ORDER — COCONUT OIL OIL
1.0000 "application " | TOPICAL_OIL | Status: DC | PRN
  Filled 2019-10-09: qty 120

## 2019-10-09 MED ORDER — METHYLERGONOVINE MALEATE 0.2 MG/ML IJ SOLN: 0.2000 mg | Freq: Once | INTRAMUSCULAR | Status: DC

## 2019-10-09 MED ORDER — BENZOCAINE-MENTHOL 20-0.5 % EX AERO
1.0000 "application " | INHALATION_SPRAY | CUTANEOUS | Status: DC | PRN
  Filled 2019-10-09: qty 56

## 2019-10-09 MED ORDER — FERROUS SULFATE 325 (65 FE) MG PO TABS
325.0000 mg | ORAL_TABLET | Freq: Every day | ORAL | Status: DC
  Administered 2019-10-09 – 2019-10-10 (×2): 325 mg via ORAL
  Filled 2019-10-09 (×2): qty 1

## 2019-10-09 MED ORDER — ONDANSETRON HCL 4 MG PO TABS: 4.0000 mg | ORAL_TABLET | ORAL | Status: DC | PRN

## 2019-10-09 MED ORDER — DIBUCAINE (PERIANAL) 1 % EX OINT
1.0000 "application " | TOPICAL_OINTMENT | CUTANEOUS | Status: DC | PRN
  Filled 2019-10-09: qty 28

## 2019-10-09 MED ORDER — ACETAMINOPHEN 500 MG PO TABS
1000.0000 mg | ORAL_TABLET | Freq: Four times a day (QID) | ORAL | Status: DC | PRN
  Administered 2019-10-09: 1000 mg via ORAL
  Filled 2019-10-09: qty 2

## 2019-10-09 MED ORDER — WITCH HAZEL-GLYCERIN EX PADS
1.0000 "application " | MEDICATED_PAD | CUTANEOUS | Status: DC | PRN
  Filled 2019-10-09: qty 100

## 2019-10-09 MED ORDER — OXYCODONE HCL 5 MG PO TABS: 5.0000 mg | ORAL_TABLET | ORAL | Status: DC | PRN

## 2019-10-09 MED ORDER — PRENATAL MULTIVITAMIN CH
1.0000 | ORAL_TABLET | Freq: Every day | ORAL | Status: DC
  Administered 2019-10-09: 1 via ORAL
  Filled 2019-10-09: qty 1

## 2019-10-09 NOTE — Discharge Summary (Addendum)
Postpartum Discharge Summary  Date of Service updated 10/10/2019     Patient Name: Isabel Higgins DOB: [birth year] MRN: [id number]  Date of admission: 10/08/2019 Delivery date:10/09/2019  Delivering provider: Dalia Heading , CNM Date of discharge: 10/10/2019  Admitting diagnosis: Encounter for induction of labor [Z34.90] Intrauterine pregnancy: [redacted]w[redacted]d    Secondary diagnosis:  Pelvic pain in pregnancy Additional problems:     Discharge diagnosis: Term Pregnancy Delivered                                              Post partum procedures:NA Augmentation: AROM, Pitocin and Cytotec Complications: None  Hospital course: Induction of Labor With Vaginal Delivery   24y.o. yo G2P1001 at 361w1das admitted to the hospital 10/08/2019 for induction of labor.  Indication for induction: Favorable cervix at term.  Patient had an uncomplicated labor course as follows: Membrane Rupture Time/Date: 5:20 PM ,10/08/2019   Delivery Method:Vaginal, Spontaneous  Episiotomy: None  Lacerations:  Periurethral  Details of delivery can be found in separate delivery note.  Patient had a routine postpartum course. Patient is discharged home 10/10/19.  Newborn Data: Birth date:10/09/2019  Birth time:12:36 AM  Gender:Female  Living status:Living  Apgars:8 ,9  Weight:4170 g   Magnesium Sulfate received: no BMZ received: No Rhophylac:No MMR:No T-DaP:Given prenatally Flu: Yes Transfusion:No  Physical exam  Vitals:   10/09/19 1528 10/09/19 2340 10/10/19 0700 10/10/19 0843  BP: 112/79 118/79 120/80 117/70  Pulse: 88 84 77 76  Resp: '18 18 20 18  '$ Temp: 97.9 F (36.6 C) 98.7 F (37.1 C) 98 F (36.7 C) 98.5 F (36.9 C)  TempSrc: Oral Oral  Oral  SpO2: 97% 98% 98% 99%  Weight:      Height:       General: alert, cooperative and no distress Lochia: appropriate Uterine Fundus: firm Incision: Healing well with no significant drainage DVT Evaluation: No evidence of DVT seen on physical  exam. Negative Homan's sign. No cords or calf tenderness. Labs: Lab Results  Component Value Date   WBC 8.3 10/10/2019   HGB 10.3 (L) 10/10/2019   HCT 30.6 (L) 10/10/2019   MCV 86.0 10/10/2019   PLT 129 (L) 10/10/2019   CMP Latest Ref Rng & Units 08/17/2019  Glucose 70 - 99 mg/dL 121(H)  BUN 6 - 20 mg/dL 8  Creatinine 0.44 - 1.00 mg/dL 0.64  Sodium 135 - 145 mmol/L 137  Potassium 3.5 - 5.1 mmol/L 3.7  Chloride 98 - 111 mmol/L 108  CO2 22 - 32 mmol/L 21(L)  Calcium 8.9 - 10.3 mg/dL 8.8(L)  Total Protein 6.5 - 8.1 g/dL -  Total Bilirubin 0.3 - 1.2 mg/dL -  Alkaline Phos 38 - 126 U/L -  AST 15 - 41 U/L -  ALT 0 - 44 U/L -   Edinburgh Score: Edinburgh Postnatal Depression Scale Screening Tool 10/09/2019  I have been able to laugh and see the funny side of things. 1  I have looked forward with enjoyment to things. 0  I have blamed myself unnecessarily when things went wrong. 2  I have been anxious or worried for no good reason. 2  I have felt scared or panicky for no good reason. 1  Things have been getting on top of me. 1  I have been so unhappy that I have had difficulty sleeping. 1  I have felt sad or miserable. 1  I have been so unhappy that I have been crying. 1  The thought of harming myself has occurred to me. 0  Edinburgh Postnatal Depression Scale Total 10      After visit meds:  Allergies as of 10/10/2019      Reactions   Honeysuckle Flower [lonicera] Swelling, Rash, Other (See Comments)   Other Reaction: BREAK OUT, FACIAL SWELLING, WE   Amoxicillin Nausea And Vomiting   Did it involve swelling of the face/tongue/throat, SOB, or low BP? No Did it involve sudden or severe rash/hives, skin peeling, or any reaction on the inside of your mouth or nose? No Did you need to seek medical attention at a hospital or doctor's office? No When did it last happen?Can tolerate but gets an upset stomach If all above answers are "NO", may proceed with cephalosporin use.             Discharge home in stable condition Infant Feeding: routine diet Infant Disposition:home with mother Discharge instruction: per After Visit Summary and Postpartum booklet. Activity: Advance as tolerated. Pelvic rest for 6 weeks.  Diet: routine diet Anticipated Birth Control: Mirena Postpartum Appointment:6 weeks Additional Postpartum F/U: Postpartum Depression checkup in 2 weeks Future Appointments:No future appointments. Follow up Visit:  Follow-up Information    Dalia Heading, CNM. Schedule an appointment as soon as possible for a visit in 2 week(s).   Specialty: Certified Nurse Midwife Why: for mood check postpartum Contact information: [place] [address] [phone]              Discussed her hx of PP mood issues. Mutually decided to start her on zoloft 50 mg po daily. She has used this before. Rx sent to her pharmacy.     10/10/2019 Imagene Riches, CNM

## 2019-10-09 NOTE — Progress Notes (Signed)
   Subjective:  Postpartum day 0, 8 hours: patient reports she is doing well. She mentions some chest tightness that she remembers experiencing with her previous epidural. She is tolerating regular diet. Her pain is controlled with PO medication. She is ambulating and voiding without difficulty. She reports breastfeeding is going well.  Objective:  Vital signs in last 24 hours: Temp:  [98 F (36.7 C)-99 F (37.2 C)] 98.8 F (37.1 C) (07/17 0836) Pulse Rate:  [79-117] 79 (07/17 0836) Resp:  [14-20] 20 (07/17 0836) BP: (96-138)/(49-84) 116/82 (07/17 0836) SpO2:  [92 %-100 %] 98 % (07/17 0836)    General: NAD Pulmonary: no increased work of breathing Abdomen: non-distended, non-tender, fundus firm at level of umbilicus Extremities: no edema, no erythema, no tenderness  Results for orders placed or performed during the hospital encounter of 10/08/19 (from the past 72 hour(s))  Type and screen [facility]     Status: None   Collection Time: 10/08/19  8:36 AM  Result Value Ref Range   ABO/RH(D) A POS    Antibody Screen NEG    Sample Expiration      10/11/2019,2359 Performed at [facility], [address] [place], [place] [address]   CBC     Status: Abnormal   Collection Time: 10/08/19  8:36 AM  Result Value Ref Range   WBC 9.5 4.0 - 10.5 K/uL   RBC 4.10 3.87 - 5.11 MIL/uL   Hemoglobin 11.9 (L) 12.0 - 15.0 g/dL   HCT 74.8 (L) 36 - 46 %   MCV 83.7 80.0 - 100.0 fL   MCH 29.0 26.0 - 34.0 pg   MCHC 34.7 30.0 - 36.0 g/dL   RDW 27.0 78.6 - 75.4 %   Platelets 149 (L) 150 - 400 K/uL   nRBC 0.0 0.0 - 0.2 %    Comment: Performed at [facility], [address] [place], [place] [address]  RPR     Status: None   Collection Time: 10/08/19  8:36 AM  Result Value Ref Range   RPR Ser Ql NON REACTIVE NON REACTIVE    Comment: Performed at [facility], [address] [place], [place] [address]    Assessment:   24 y.o. [phone] postpartum  day # 0, lactating  Plan:    1) Acute blood loss anemia - hemodynamically stable and asymptomatic - po ferrous sulfate  2) Blood Type --/--/A POS (07/16 2549) / Rubella 1.20 (11/17 1008) / Varicella Immune  3) TDAP status up to date  4) Feeding plan breast  5)  Education given regarding options for contraception, as well as compatibility with breast feeding if applicable.  Patient plans on IUD for contraception.  6) Disposition: continue current care   Tresea Mall, CNM [facility] 10/09/2019, 12:38 PM

## 2019-10-09 NOTE — Progress Notes (Signed)
Score of 10 on Edinburgh Assessment. Transition of Care Consult placed.

## 2019-10-09 NOTE — Lactation Note (Addendum)
This note was copied from a baby's chart. Lactation Consultation Note  Patient Name: Isabel Higgins Date: 10/09/2019 Reason for consult: Initial assessment;Mother's request;Difficult latch;Term;Other (Comment) (LGA, Receding chin, Lip tie)  Isabel Higgins has been struggling to achieve deep latch with Isabel Higgins. 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  Isabel Higgins said her first baby had a lip and tongue tie, but it never was a problem with breast feeding.  She breast fed her first for over 2 years, 3 months into this pregnancy before weaning.  Assisted Isabel Higgins with pillow support to get [name] into football hold.  Demonstrated how to easily hand express a good amount of colostrum.  At first, he clamped down and would not open his mouth.  After several attempts, we were able to get a deep enough latch and flanged lips that he could feel the breast in his mouth to start sucking.  Had to gently lower the chin to get it further in his mouth.  Once we had a deep enough latch, he began strong, rhythmic sucking with occasional swallows heard.  He nursed for 7 minutes and then started pinching and clamping down again slipping to tip of nipple.  When he came off the breast, the nipple was pinched.  Encouraged Isabel Higgins to put [name] to the breast whenever he demonstrated feeding cues.  Reviewed normal newborn stomach size, adequate intake and out put, supply and demand, normal course of lactation and routine newborn feeding patterns.  Lactation Government social research officer given and reviewed.  Lactation name and number written on white board and encouraged to call with any questions, concerns or assistance.     Maternal Data Formula Feeding for Exclusion: No Has patient been taught Hand Expression?: Yes Does the patient have breastfeeding experience prior to this delivery?: Yes  Feeding Feeding Type: Breast Fed  LATCH Score Latch: Repeated attempts needed to sustain latch,  nipple held in mouth throughout feeding, stimulation needed to elicit sucking reflex.  Audible Swallowing: A few with stimulation  Type of Nipple: Everted at rest and after stimulation  Comfort (Breast/Nipple): Soft / non-tender  Hold (Positioning): Assistance needed to correctly position infant at breast and maintain latch.  LATCH Score: 7  Interventions Interventions: Breast feeding basics reviewed;Assisted with latch;Skin to skin;Breast massage;Hand express;Breast compression;Adjust position;Support pillows;Position options;Coconut oil  Lactation Tools Discussed/Used WIC Program: Yes (Also has [facility])   Consult Status Consult Status: Follow-up Follow-up type: Call as needed    Isabel Higgins 10/09/2019, 8:29 PM

## 2019-10-09 NOTE — Anesthesia Postprocedure Evaluation (Signed)
Anesthesia Post Note  Patient: Isabel Higgins  Procedure(s) Performed: AN AD HOC LABOR EPIDURAL  Patient location during evaluation: Mother Baby Anesthesia Type: Epidural Level of consciousness: awake and alert Pain management: pain level controlled Vital Signs Assessment: post-procedure vital signs reviewed and stable Respiratory status: spontaneous breathing, nonlabored ventilation and respiratory function stable Cardiovascular status: stable Postop Assessment: no headache, no backache and patient able to bend at knees Anesthetic complications: no   No complications documented.   Last Vitals:  Vitals:   10/09/19 0434 10/09/19 0836  BP: 122/81 116/82  Pulse: 82 79  Resp: 18 20  Temp: 36.7 C 37.1 C  SpO2: 97% 98%    Last Pain:  Vitals:   10/09/19 0836  TempSrc: Oral  PainSc:                  Cleda Mccreedy Joakim Huesman

## 2019-10-10 LAB — CBC
HCT: 30.6 % — ABNORMAL LOW (ref 36.0–46.0)
Hemoglobin: 10.3 g/dL — ABNORMAL LOW (ref 12.0–15.0)
MCH: 28.9 pg (ref 26.0–34.0)
MCHC: 33.7 g/dL (ref 30.0–36.0)
MCV: 86 fL (ref 80.0–100.0)
Platelets: 129 10*3/uL — ABNORMAL LOW (ref 150–400)
RBC: 3.56 MIL/uL — ABNORMAL LOW (ref 3.87–5.11)
RDW: 15.3 % (ref 11.5–15.5)
WBC: 8.3 10*3/uL (ref 4.0–10.5)
nRBC: 0 % (ref 0.0–0.2)

## 2019-10-10 MED ORDER — SERTRALINE HCL 50 MG PO TABS
50.0000 mg | ORAL_TABLET | Freq: Every day | ORAL | 11 refills | Status: DC
Start: 2019-10-10 — End: 2019-10-25

## 2019-10-10 MED ORDER — OXYCODONE HCL 5 MG PO TABS: 5.0000 mg | ORAL_TABLET | ORAL | 0 refills | Status: DC | PRN

## 2019-10-10 NOTE — Discharge Instructions (Signed)
Vaginal Delivery, Care After Refer to this sheet in the next few weeks. These discharge instructions provide you with information on caring for yourself after delivery. Your caregiver may also give you specific instructions. Your treatment has been planned according to the most current medical practices available, but problems sometimes occur. Call your caregiver if you have any problems or questions after you go home. HOME CARE INSTRUCTIONS 1. Take over-the-counter or prescription medicines only as directed by your caregiver or pharmacist. 2. Do not drink alcohol, especially if you are breastfeeding or taking medicine to relieve pain. 3. Do not smoke tobacco. 4. Continue to use good perineal care. Good perineal care includes: 1. Wiping your perineum from back to front 2. Keeping your perineum clean. 3. You can do sitz baths twice a day, to help keep this area clean 5. Do not use tampons, douche or have sex for 6 weeks 6. Shower only and avoid sitting in submerged water, aside from sitz baths 7. Wear a well-fitting bra that provides breast support. 8. Eat healthy foods. 9. Drink enough fluids to keep your urine clear or pale yellow. 10. Eat high-fiber foods such as whole grain cereals and breads, brown rice, beans, and fresh fruits and vegetables every day. These foods may help prevent or relieve constipation. 11. Avoid constipation with high fiber foods or medications, such as miralax or metamucil 12. Follow your caregiver's recommendations regarding resumption of activities such as climbing stairs, driving, lifting, exercising, or traveling. 42. Talk to your caregiver about resuming sexual activities. Resumption of sexual activities after 6 weeks is dependent upon your risk of infection, your rate of healing, and your comfort and desire to resume sexual activity. 14. Try to have someone help you with your household activities and your newborn for at least a few days after you leave the  hospital. 15. Rest as much as possible. Try to rest or take a nap when your newborn is sleeping. 16. Increase your activities gradually. 70. Keep all of your scheduled postpartum appointments. It is very important to keep your scheduled follow-up appointments. At these appointments, your caregiver will be checking to make sure that you are healing physically and emotionally. SEEK MEDICAL CARE IF:   You are passing large clots from your vagina. Save any clots to show your caregiver.  You have a foul smelling discharge from your vagina.  You have trouble urinating.  You are urinating frequently.  You have pain when you urinate.  You have a change in your bowel movements.  You have increasing redness, pain, or swelling near your vaginal incision (episiotomy) or vaginal tear.  You have pus draining from your episiotomy or vaginal tear.  Your episiotomy or vaginal tear is separating.  You have painful, hard, or reddened breasts.  You have a severe headache.  You have blurred vision or see spots.  You feel sad or depressed.  You have thoughts of hurting yourself or your newborn.  You have questions about your care, the care of your newborn, or medicines.  You are dizzy or light-headed.  You have a rash.  You have nausea or vomiting.  You were breastfeeding and have not had a menstrual period within 12 weeks after you stopped breastfeeding.  You are not breastfeeding and have not had a menstrual period by the 12th week after delivery.  You have a fever of 100.5 or more SEEK IMMEDIATE MEDICAL CARE IF:   You have persistent pain.  You have chest pain.  You have shortness  of breath.  You faint.  You have leg pain.  You have stomach pain.  Your vaginal bleeding saturates two or more sanitary pads in 1 hour. MAKE SURE YOU:   Understand these instructions.  Will watch your condition.  Will get help right away if you are not doing well or get worse. Document  Released: 03/08/2000 Document Revised: 07/26/2013 Document Reviewed: 11/06/2011 [facility] Patient Information 2015 [place], [place]. This information is not intended to replace advice given to you by your health care provider. Make sure you discuss any questions you have with your health care provider.  Sitz Bath A sitz bath is a warm water bath taken in the sitting position. The water covers only the hips and butt (buttocks). We recommend using one that fits in the toilet, to help with ease of use and cleanliness. It may be used for either healing or cleaning purposes. Sitz baths are also used to relieve pain, itching, or muscle tightening (spasms). The water may contain medicine. Moist heat will help you heal and relax.  HOME CARE  Take 3 to 4 sitz baths a day. 18. Fill the bathtub half-full with warm water. 19. Sit in the water and open the drain a little. 20. Turn on the warm water to keep the tub half-full. Keep the water running constantly. 21. Soak in the water for 15 to 20 minutes. 22. After the sitz bath, pat the affected area dry. GET HELP RIGHT AWAY IF: You get worse instead of better. Stop the sitz baths if you get worse. MAKE SURE YOU:  Understand these instructions.  Will watch your condition.  Will get help right away if you are not doing well or get worse. Document Released: 04/18/2004 Document Revised: 12/04/2011 Document Reviewed: 07/09/2010 [facility] Patient Information 2015 [place], [place]. This information is not intended to replace advice given to you by your health care provider. Make sure you discuss any questions you have with your health care provider.  Postpartum Baby Blues The postpartum period begins right after the birth of a baby. During this time, there is often a lot of joy and excitement. It is also a time of many changes in the life of the parents. No matter how many times a mother gives birth, each child brings new challenges to the family, including  different ways of relating to one another. It is common to have feelings of excitement along with confusing changes in moods, emotions, and thoughts. You may feel happy one minute and sad or stressed the next. These feelings of sadness usually happen in the period right after you have your baby, and they go away within a week or two. This is called the "baby blues." What are the causes? There is no known cause of baby blues. It is likely caused by a combination of factors. However, changes in hormone levels after childbirth are believed to trigger some of the symptoms. Other factors that can play a role in these mood changes include: Lack of sleep. Stressful life events, such as poverty, caring for a loved one, or death of a loved one. Genetics. What are the signs or symptoms? Symptoms of this condition include: Brief changes in mood, such as going from extreme happiness to sadness. Decreased concentration. Difficulty sleeping. Crying spells and tearfulness. Loss of appetite. Irritability. Anxiety. If the symptoms of baby blues last for more than 2 weeks or become more severe, you may have postpartum depression. How is this diagnosed? This condition is diagnosed based on an evaluation  of your symptoms. There are no medical or lab tests that lead to a diagnosis, but there are various questionnaires that a health care provider may use to identify women with the baby blues or postpartum depression. How is this treated? Treatment is not needed for this condition. The baby blues usually go away on their own in 1-2 weeks. Social support is often all that is needed. You will be encouraged to get adequate sleep and rest. Follow these instructions at home: Lifestyle     Get as much rest as you can. Take a nap when the baby sleeps. Exercise regularly as told by your health care provider. Some women find yoga and walking to be helpful. Eat a balanced and nourishing diet. This includes plenty of  fruits and vegetables, whole grains, and lean proteins. Do little things that you enjoy. Have a cup of tea, take a bubble bath, read your favorite magazine, or listen to your favorite music. Avoid alcohol. Ask for help with household chores, cooking, grocery shopping, or running errands. Do not try to do everything yourself. Consider hiring a postpartum doula to help. This is a professional who specializes in providing support to new mothers. Try not to make any major life changes during pregnancy or right after giving birth. This can add stress. General instructions Talk to people close to you about how you are feeling. Get support from your partner, family members, friends, or other new moms. You may want to join a support group. Find ways to cope with stress. This may include: Writing your thoughts and feelings in a journal. Spending time outside. Spending time with people who make you laugh. Try to stay positive in how you think. Think about the things you are grateful for. Take over-the-counter and prescription medicines only as told by your health care provider. Let your health care provider know if you have any concerns. Keep all postpartum visits as told by your health care provider. This is important. Contact a health care provider if: Your baby blues do not go away after 2 weeks. Get help right away if: You have thoughts of taking your own life (suicidal thoughts). You think you may harm the baby or other people. You see or hear things that are not there (hallucinations). Summary After giving birth, you may feel happy one minute and sad or stressed the next. Feelings of sadness that happen right after the baby is born and go away after a week or two are called the "baby blues." You can manage the baby blues by getting enough rest, eating a healthy diet, exercising, spending time with supportive people, and finding ways to cope with stress. If feelings of sadness and stress last  longer than 2 weeks or get in the way of caring for your baby, talk to your health care provider. This may mean you have postpartum depression. This information is not intended to replace advice given to you by your health care provider. Make sure you discuss any questions you have with your health care provider. Document Revised: 07/03/2018 Document Reviewed: 05/07/2016 Elsevier Patient Education  2020 [employer].  Breastfeeding Tips for a Good Latch Latching is how your baby's mouth attaches to your nipple to breastfeed. It is an important part of breastfeeding. Your baby may have trouble latching for a number of reasons. A poor latch may cause you to have cracked or sore nipples or other problems. Follow these instructions at home: How to position your baby  Find a comfortable place  to sit or lie down. Your neck and back should be well supported.  If you are seated, place a pillow or rolled-up blanket under your baby. This will bring him or her to the level of your breast.  Make sure that your baby's belly (abdomen) is facing your belly.  Try different positions to find one that works best for you and your baby. How to help your baby latch   To start, gently rub your breast. Move your fingertips in a circle as you massage from your chest wall toward your nipple. This helps milk flow. Keep doing this during feeding if needed.  Position your breast. Hold your breast with four fingers underneath and your thumb above your nipple. Keep your fingers away from your nipple and your baby's mouth. Follow these steps to help your baby latch: 1. Rub your baby's lips gently with your finger or nipple. 2. When your baby's mouth is open wide enough, quickly bring your baby to your breast and place your whole nipple into your baby's mouth. Place as much of the colored area around your nipple (areola)as possible into your baby's mouth. 3. Your baby's tongue should be between his or her lower gum and  your breast. 4. You should be able to see more areola above your baby's upper lip than below the lower lip. 5. When your baby starts sucking, you will feel a gentle pull on your nipple. You should not feel any pain. Be patient. It is common for a baby to suck for about 2-3 minutes to start the flow of breast milk. 6. Make sure that your baby's mouth is in the right position around your nipple. Your baby's lips should make a seal on your breast and be turned outward.  General instructions  Look for these signs that your baby has latched on to your nipple: ? The baby is quietly tugging or sucking without causing you pain. ? You hear the baby swallow after every 3 or 4 sucks. ? You see movement above and in front of the baby's ears while he or she is sucking.  Be aware of these signs that your baby has not latched on to your nipple: ? The baby makes sucking sounds or smacking sounds while feeding. ? You have nipple pain.  If your baby is not latched well, put your little finger between your baby's gums and your nipple. This will break the seal. Then try to help your baby latch again.  If you keep having problems, get help from a breastfeeding specialist (Advertising copywriter). Contact a doctor if:  You have cracking or soreness in your nipples that lasts longer than 1 week.  You have nipple pain.  Your breasts are filled with too much milk (engorgement), and this does not improve after 48-72 hours.  You have a plugged milk duct and a fever.  You follow the tips for a good latch but you keep having problems or concerns.  You have a pus-like fluid coming from your breast.  Your baby is not gaining weight.  Your baby loses weight. Summary  Latching is how your baby's mouth attaches to your nipple to breastfeed.  Try different positions for breastfeeding to find one that works best for you and your baby.  A poor latch may cause you to have cracked or sore nipples or other  problems. This information is not intended to replace advice given to you by your health care provider. Make sure you discuss any questions you have  with your health care provider. Document Revised: 07/01/2018 Document Reviewed: 10/16/2016 Elsevier Patient Education  2020 [employer]

## 2019-10-10 NOTE — Final Progress Note (Signed)
See Discharge Summary  Mirna Mires, CNM  10/10/2019 10:21 AM

## 2019-10-10 NOTE — Progress Notes (Signed)
Pt discharged with infant. Discharge instructions, prescriptions, and follow up appointments given to and reviewed with patient. Pt verbalized understanding. To be escorted out by staff. °

## 2019-10-10 NOTE — Social Work (Signed)
TOC CM/SW received social work consult due to positive depression screening.   The patient is a 24 year old female who reported that she is a mother of two children.  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  Felt that medication helped.   Social worker taught the patient about PPD and urged pt to follow-up with primary care provider.   Patient reported that she has family/friends supports in place. Father of her newborn is a good support.   Patient provided with a list of primary care providers in [facility].

## 2019-10-12 ENCOUNTER — Telehealth: Payer: Self-pay

## 2019-10-12 NOTE — Telephone Encounter (Signed)
Pt called triage reporting that she feels bad, h/a feet and hands are swollen, shes feels heavy in the chest and feels light headed when she stands, wondering if this is normal from the epidural?

## 2019-10-12 NOTE — Telephone Encounter (Signed)
Returned patient call. Had SVD 10/08/2019. Now PPD #4. Feels bad yesterday and today. Has headache all over head, feels lightheaded, legs swollen, heaviness in chest/ SOB.  Took blood pressure earlier today and it was 135/85. Pulse in 80s. Bleeding moderate, small clots. No fever.  H&H on 7/18 was 10.3 and 30.6 with plt 129K.  History of fever,headache, abdominal pain and lightheadedness on PPD #4 with previous delivery and was treated for possible endometritis thru ED.   Is talking with me and normal rate  A: Concern for preeclampsia postpartum and would want to rule out thromboembolic disorder. Some symptoms may be related to anemia. Recommend going to ER for evaluation. If symptoms do not worsen-can see if she can be seen in office tomorrow. But she needs to be evaluated.  Farrel Conners, CNM

## 2019-10-13 ENCOUNTER — Encounter: Payer: 59 | Admitting: Physical Therapy

## 2019-10-13 ENCOUNTER — Ambulatory Visit: Payer: Self-pay

## 2019-10-13 NOTE — Lactation Note (Signed)
This note was copied from a baby's chart. Lactation Consultation Note  Patient Name: Isabel Higgins Today's Date: 10/13/2019     Maternal Data   This is mom's second baby. 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 Mom reports family history of tongue/lip ties on her side that may/may not have impacted feedings. Mom has larger breasts with nipples that do erect with stimulation but not fully. Mom reporting onset of mature milk this morning around 0245, baby continuing to have difficulty latching, and has elevated jaundice levels.  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 Baby had difficulty latching while in the hospital, but parents have continued to offer him the breast and supplement as needed.   Feeding   Feeding initially began in football hold on mom's right breast. Support pillows were used, and mom felt confident in getting baby into position. LC observed baby being brought to the breast with wide open mouth and eager, however chin remained away from breast tissue and baby repeatedly had difficulty sustaining the latching.  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 LC discussed with parents the likelihood that all 3 may have in baby's ability to sustain the latch and successfully remove milk from the breast.  Nipple shield was offered and accepted: size 25mm. Baby accepted the shield easily, and could sustain the latch for longer periods before release in football hold, milk evident in shield each time baby came off the breast. After 10 minutes of on/off in football, LC suggested transition to more of a laid back position to help baby have the ability to put more of his chin into the tissue.LC assisted with the move as mom has less experience in this hold; baby transitioned well, accepted  the breast/nipple shield easily, and sustained latch for a longer period of time with audible swallows; approximately 6 minutes. Modified cradle hold was also attempted with baby on/off, before sustaining for additional 8 solid minutes with audible swallows heard throughout and baby coming off breast on his own appearing full/satisfied.  LATCH Score                   Interventions  Various position changes: football, biological nursing (laid back nursing), modified cradle, use of nipple shield  Lactation Tools Discussed/Used   Nipple shield size 39mm provided and used; baby able to successfully latch, sustain latch, and transfer mature milk   Consult Status   Mom encouraged to continue offering the breast at each feeding working on various positions that aid baby in sustaining latch, mom encouraged to pump using her own DEBP every 2-3 hours for breast emptying, management of breast fullness/engorgement, and protection of milk supply. Information was also given for two area pediatric dentists for second opinions/next steps for suspected tongue/lip tie.  Encouraged mom to call back into lactation as needed for assistance via phone or in person appointment.   [name] 10/13/2019, 3:33 PM

## 2019-10-14 ENCOUNTER — Encounter: Payer: Self-pay | Admitting: Obstetrics and Gynecology

## 2019-10-14 ENCOUNTER — Other Ambulatory Visit: Payer: Self-pay

## 2019-10-14 ENCOUNTER — Ambulatory Visit (INDEPENDENT_AMBULATORY_CARE_PROVIDER_SITE_OTHER): Payer: 59 | Admitting: Obstetrics and Gynecology

## 2019-10-14 NOTE — Progress Notes (Signed)
Obstetrics & Gynecology Office Visit   Chief Complaint  Patient presents with  . Postpartum Care    Vaginal delivery 7/17, B/P pressure check   . Blurred vision, headaches   History of Present Illness: 24 y.o. G39P2002 female who is postpartum day #5 s/p SVD.  Her pregnancy was complicated by morbid obesity with BMI 41, pelvic and back pain since mid second trimester (received PT), history of macrosomia with first baby, history of gestational hypertension with G1, history of depression, rectocele, and bleeding hemorrhoids.   She had an uncomplicated labor and delivery course and was discharged to home on PPD#1. She called the office two days ago feeling not well. She has had swelling in her feet bilaterally and numbness. When she stands she has chest pain. She has felt lightheaded with some really bad headaches.  The thing that bothers her the most is the chest tightness.  The chest tightness doesn't bother her when she's lying or standing.  The chest pain is mostly when she first stands and when she walks around.  She feels like she is short of breath.  She has not passed out.  She does feel lightheaded.   For her headaches, whenever she gets a headache, it is just there.  It is not positionally dependant.   She checked her BP at home and it was 138/83.  She also started Zoloft prior to discharge. She states that from a mood standpoint she is getting better. Her mood is not bad during the day, it is worse at night.  Her discharge hemoglobin/hematocrit was 10.3/30.6. She is breastfeeding. She saw lactation yesterday because her infant is having a hard time latching.  She is mostly pumping. She denies abdominal pain.  She had chills.  She says her husband says she felt warm. However, they couldn't find the thermometer.  She does have breast pain.  She noted a red spot on her right breast. However, the lactation specialist she saw yesterday said the area looked more like a bump.  She denies back pain.    Past Medical History:  Diagnosis Date  . Bradycardia   . Depression   . GERD (gastroesophageal reflux disease)   . Hemorrhoids during pregnancy in third trimester   . Menometrorrhagia 08/07/2016  . Mental disorder   . Morbid obesity (HCC)   . Psoriasis     Past Surgical History:  Procedure Laterality Date  . INNER EAR SURGERY     right  . LAPAROSCOPY N/A 11/19/2018   Procedure: LAPAROSCOPY DIAGNOSTIC AND BIOPSY;  Surgeon: Nadara Mustard, MD;  Location: [facility] ORS;  Service: Gynecology;  Laterality: N/A;  . SHOULDER SURGERY     right    Gynecologic History: No LMP recorded.  Obstetric History: [id number]  Family History  Problem Relation Age of Onset  . Hypertension Father   . Diabetes Maternal Grandfather   . Heart attack Maternal Grandmother        x5  . Diabetes Paternal Grandfather     Social History   Socioeconomic History  . Marital status: Married    Spouse name: Joselyn Glassman  . Number of children: 1  . Years of education: Not on file  . Highest education level: Not on file  Occupational History  . Not on file  Tobacco Use  . Smoking status: Never Smoker  . Smokeless tobacco: Never Used  Vaping Use  . Vaping Use: Never used  Substance and Sexual Activity  . Alcohol use: No    Alcohol/week:  0.0 standard drinks  . Drug use: No  . Sexual activity: Yes    Birth control/protection: I.U.D.  Other Topics Concern  . Not on file  Social History Narrative  . Not on file   Social Determinants of Health   Financial Resource Strain:   . Difficulty of Paying Living Expenses:   Food Insecurity:   . Worried About Programme researcher, broadcasting/film/video in the Last Year:   . Barista in the Last Year:   Transportation Needs:   . Freight forwarder (Medical):   Marland Kitchen Lack of Transportation (Non-Medical):   Physical Activity:   . Days of Exercise per Week:   . Minutes of Exercise per Session:   Stress:   . Feeling of Stress :   Social Connections:   . Frequency of  Communication with Friends and Family:   . Frequency of Social Gatherings with Friends and Family:   . Attends Religious Services:   . Active Member of Clubs or Organizations:   . Attends Banker Meetings:   Marland Kitchen Marital Status:   Intimate Partner Violence:   . Fear of Current or Ex-Partner:   . Emotionally Abused:   Marland Kitchen Physically Abused:   . Sexually Abused:     Allergies  Allergen Reactions  . Honeysuckle Flower [Lonicera] Swelling, Rash and Other (See Comments)    Other Reaction: BREAK OUT, FACIAL SWELLING, WE  . Amoxicillin Nausea And Vomiting    Did it involve swelling of the face/tongue/throat, SOB, or low BP? No Did it involve sudden or severe rash/hives, skin peeling, or any reaction on the inside of your mouth or nose? No Did you need to seek medical attention at a hospital or doctor's office? No When did it last happen?Can tolerate but gets an upset stomach If all above answers are "NO", may proceed with cephalosporin use.     Prior to Admission medications   Medication Sig Start Date End Date Taking? Authorizing Provider  calcium carbonate (TUMS - DOSED IN MG ELEMENTAL CALCIUM) 500 MG chewable tablet Chew 1 tablet by mouth 2 (two) times daily as needed for indigestion or heartburn.   Yes [provider]  folic acid (FOLVITE) 1 MG tablet Take 1 tablet (1 mg total) by mouth daily. 02/09/19  Yes Vena Austria, MD  pantoprazole (PROTONIX) 20 MG tablet TAKE 1 TABLET BY MOUTH EVERY DAY AS NEEDED 08/19/19  Yes Nadara Mustard, MD  pramoxine-hydrocortisone [facility]) cream Apply topically 3 (three) times daily. 10/05/19  Yes Farrel Conners, CNM  Prenatal-DSS-FeCb-FeGl-FA (CITRANATAL BLOOM) 90-1 MG TABS Take 1 tablet by mouth daily. 03/10/19  Yes Vena Austria, MD  sertraline (ZOLOFT) 50 MG tablet Take 1 tablet (50 mg total) by mouth daily. 10/10/19 10/09/20 Yes Mirna Mires, CNM    Review of Systems  Constitutional: Positive for chills.  Negative for fever.  HENT: Negative.   Eyes: Negative.  Negative for blurred vision and double vision.  Respiratory: Negative.   Cardiovascular: Positive for chest pain (tightness with standing, see HPI) and leg swelling. Negative for palpitations.  Gastrointestinal: Negative.  Negative for abdominal pain and vomiting.  Genitourinary: Negative.   Musculoskeletal: Negative.  Negative for back pain, joint pain, myalgias and neck pain.  Skin: Negative.  Negative for itching.  Neurological: Positive for dizziness and headaches. Negative for tingling, tremors, sensory change, speech change, focal weakness, seizures, loss of consciousness and weakness.  Psychiatric/Behavioral: Negative.      Physical Exam BP 133/83  Pulse 69   Wt 245 lb (111.1 kg)   Breastfeeding Yes   BMI 38.37 kg/m  No LMP recorded. Physical Exam Constitutional:      General: She is not in acute distress.    Appearance: Normal appearance. She is well-developed. She is not ill-appearing.  HENT:     Head: Normocephalic and atraumatic.  Eyes:     General: No scleral icterus.    Conjunctiva/sclera: Conjunctivae normal.  Cardiovascular:     Rate and Rhythm: Normal rate and regular rhythm.     Heart sounds: No murmur heard.  No friction rub. No gallop.   Pulmonary:     Effort: Pulmonary effort is normal. No respiratory distress.     Breath sounds: Normal breath sounds. No wheezing or rales.  Abdominal:     General: Bowel sounds are normal. There is no distension.     Palpations: Abdomen is soft.     Tenderness: There is no abdominal tenderness. There is no guarding or rebound.     Comments: Uterine fundus at U-3. Uterus is non-tender with a significant amount of pressure applied. No tenderness over epidural catheter site.  Musculoskeletal:        General: No swelling. Normal range of motion.     Cervical back: Normal range of motion and neck supple. No rigidity.  Neurological:     General: No focal deficit  present.     Mental Status: She is alert and oriented to person, place, and time.     Cranial Nerves: No cranial nerve deficit.  Skin:    General: Skin is warm and dry.     Findings: No erythema.  Psychiatric:        Mood and Affect: Mood normal.        Behavior: Behavior normal.        Judgment: Judgment normal.     Female chaperone present for pelvic and breast  portions of the physical exam  Assessment: 24 y.o. G71P2002 female here for  1. Postpartum care and examination      Plan: Problem List Items Addressed This Visit    None    Visit Diagnoses    Postpartum care and examination    -  Primary     She has an array of symptoms that do not point to one specific etiology. The fact that the symptoms affect multiple systems could indicate an infection, depression.  She doesn't have a clear infectious source.  Discussed that no overt diagnosis is clear today.  She was given precautions regarding worsening of symptoms, including; fevers, chills, worsening chest pain (regardless of position), trouble breathing, heart palpitations, heavy vaginal bleeding, breast tenderness focally or on one side.  Will have her follow up closely to re-assess her symptoms for resolution of to see if a specific diagnosis has declared herself. With any worsening symptom, she is to contact us or, if very concerning, go to the ER for further evaluation. She voiced understanding and agreement.   Return in about 4 days (around 10/18/2019) for Follow up postpartum.   Thomasene Mohair, MD 10/14/2019 8:51 AM

## 2019-10-15 ENCOUNTER — Encounter: Payer: Self-pay | Admitting: Emergency Medicine

## 2019-10-15 ENCOUNTER — Other Ambulatory Visit: Payer: Self-pay

## 2019-10-15 ENCOUNTER — Emergency Department
Admission: EM | Admit: 2019-10-15 | Discharge: 2019-10-15 | Disposition: A | Discharge status: Home / Self Care | Payer: 59 | Attending: Emergency Medicine | Admitting: Emergency Medicine

## 2019-10-15 ENCOUNTER — Telehealth: Payer: Self-pay

## 2019-10-15 ENCOUNTER — Emergency Department: Payer: 59

## 2019-10-15 DIAGNOSIS — R600 Localized edema: Secondary | ICD-10-CM | POA: Insufficient documentation

## 2019-10-15 DIAGNOSIS — R0789 Other chest pain: Secondary | ICD-10-CM | POA: Insufficient documentation

## 2019-10-15 DIAGNOSIS — R5381 Other malaise: Secondary | ICD-10-CM | POA: Insufficient documentation

## 2019-10-15 DIAGNOSIS — R0600 Dyspnea, unspecified: Secondary | ICD-10-CM | POA: Diagnosis not present

## 2019-10-15 DIAGNOSIS — Z5321 Procedure and treatment not carried out due to patient leaving prior to being seen by health care provider: Secondary | ICD-10-CM | POA: Insufficient documentation

## 2019-10-15 DIAGNOSIS — R102 Pelvic and perineal pain: Secondary | ICD-10-CM | POA: Diagnosis present

## 2019-10-15 LAB — CBC WITH DIFFERENTIAL/PLATELET
Abs Immature Granulocytes: 0.07 10*3/uL (ref 0.00–0.07)
Basophils Absolute: 0 10*3/uL (ref 0.0–0.1)
Basophils Relative: 0 %
Eosinophils Absolute: 0.1 10*3/uL (ref 0.0–0.5)
Eosinophils Relative: 1 %
HCT: 40 % (ref 36.0–46.0)
Hemoglobin: 13.1 g/dL (ref 12.0–15.0)
Immature Granulocytes: 1 %
Lymphocytes Relative: 18 %
Lymphs Abs: 1.9 10*3/uL (ref 0.7–4.0)
MCH: 28.9 pg (ref 26.0–34.0)
MCHC: 32.8 g/dL (ref 30.0–36.0)
MCV: 88.1 fL (ref 80.0–100.0)
Monocytes Absolute: 0.5 10*3/uL (ref 0.1–1.0)
Monocytes Relative: 5 %
Neutro Abs: 8.1 10*3/uL — ABNORMAL HIGH (ref 1.7–7.7)
Neutrophils Relative %: 75 %
Platelets: 240 10*3/uL (ref 150–400)
RBC: 4.54 MIL/uL (ref 3.87–5.11)
RDW: 15.4 % (ref 11.5–15.5)
WBC: 10.7 10*3/uL — ABNORMAL HIGH (ref 4.0–10.5)
nRBC: 0 % (ref 0.0–0.2)

## 2019-10-15 LAB — COMPREHENSIVE METABOLIC PANEL
ALT: 36 U/L (ref 0–44)
AST: 27 U/L (ref 15–41)
Albumin: 3.5 g/dL (ref 3.5–5.0)
Alkaline Phosphatase: 122 U/L (ref 38–126)
Anion gap: 12 (ref 5–15)
BUN: 12 mg/dL (ref 6–20)
CO2: 22 mmol/L (ref 22–32)
Calcium: 9.1 mg/dL (ref 8.9–10.3)
Chloride: 106 mmol/L (ref 98–111)
Creatinine, Ser: 0.66 mg/dL (ref 0.44–1.00)
GFR calc Af Amer: >60 mL/min (ref >60)
GFR calc non Af Amer: >60 mL/min (ref >60)
Glucose, Bld: 96 mg/dL (ref 70–99)
Potassium: 4.3 mmol/L (ref 3.5–5.1)
Sodium: 140 mmol/L (ref 135–145)
Total Bilirubin: 0.7 mg/dL (ref 0.3–1.2)
Total Protein: 7 g/dL (ref 6.5–8.1)

## 2019-10-15 LAB — URINALYSIS, COMPLETE (UACMP) WITH MICROSCOPIC
Bacteria, UA: NONE SEEN
Bilirubin Urine: NEGATIVE
Glucose, UA: NEGATIVE mg/dL
Ketones, ur: NEGATIVE mg/dL
Nitrite: NEGATIVE
Protein, ur: NEGATIVE mg/dL
Specific Gravity, Urine: 1.011 (ref 1.005–1.030)
pH: 7 (ref 5.0–8.0)

## 2019-10-15 LAB — TROPONIN I (HIGH SENSITIVITY)
Troponin I (High Sensitivity): 4 ng/L (ref <18)
Troponin I (High Sensitivity): 4 ng/L (ref <18)

## 2019-10-15 NOTE — ED Notes (Signed)
Pt called for repeat VS x 3. Pt not visualized in ED or outside.  

## 2019-10-15 NOTE — Telephone Encounter (Signed)
Pt called c/o vag pain and bleeding; wanted to be seen.  CLG asked me to get more information.  Called pt; lower abd is really hurting; hurts to sit and walk, sit on toilet; when she stands she saturates a pad.  Adv to go to ED.

## 2019-10-15 NOTE — ED Notes (Signed)
Pt called for VS x1.  

## 2019-10-15 NOTE — ED Notes (Signed)
Pt called for VS x2.

## 2019-10-15 NOTE — ED Triage Notes (Signed)
Pt here for lower pelvic pain and increase in vaginal bleeding with clots.  Had infection to uterus after first child and afraid same thing. Has general malaise feeling.  Started with swelling in legs initially and saw her OB who had told her he did not want to put her on anything now but if got worse go to ED.  When stands and exerts c/o chest pain and difficulty taking breath.

## 2019-10-17 NOTE — Progress Notes (Signed)
ROB/NST at 37wk4d: Very uncomfortable with pelvic pain and contractions. Seen yesterday in L&D for contractions. Cervix was still 3 cm. Baby active. No vaginal bleeding or leakage of fluid. Infrequent contractions today.  Exam: BP 128/72. Weight 260# 9oz (BMI40.82 kg/m2). Trace protein NST: initially baby very active and baseline 160s to 170s, baby then less active and baseline around 140, with accelerations to 160s to 170s, moderate variability. GBS negative  A: IUP at 37wk 4d  Reactive NST  P: Labor precautions Scheduled for IOL at 39 weeks (10/08/2019) Needs Covid testing 10/06/2019 ROB/NST 1 week  Farrel Conners, CNM

## 2019-10-18 ENCOUNTER — Ambulatory Visit: Payer: 59 | Admitting: Obstetrics & Gynecology

## 2019-10-18 ENCOUNTER — Telehealth: Payer: Self-pay | Admitting: Emergency Medicine

## 2019-10-18 NOTE — Telephone Encounter (Signed)
Called patient due to lwot to inquire about condition and follow up plans. Says she is going to see her doctor today.

## 2019-10-20 ENCOUNTER — Encounter: Payer: 59 | Admitting: Physical Therapy

## 2019-10-21 ENCOUNTER — Other Ambulatory Visit: Payer: Self-pay

## 2019-10-21 ENCOUNTER — Encounter: Payer: 59 | Admitting: Obstetrics and Gynecology

## 2019-10-21 ENCOUNTER — Ambulatory Visit (INDEPENDENT_AMBULATORY_CARE_PROVIDER_SITE_OTHER): Payer: 59 | Admitting: Certified Nurse Midwife

## 2019-10-21 ENCOUNTER — Encounter: Payer: Self-pay | Admitting: Certified Nurse Midwife

## 2019-10-21 VITALS — BP 108/60 | HR 61 | Temp 98.9°F | Ht 67.0 in | Wt 241.0 lb

## 2019-10-21 DIAGNOSIS — O99345 Other mental disorders complicating the puerperium: Secondary | ICD-10-CM

## 2019-10-21 DIAGNOSIS — F329 Major depressive disorder, single episode, unspecified: Secondary | ICD-10-CM

## 2019-10-21 DIAGNOSIS — F419 Anxiety disorder, unspecified: Secondary | ICD-10-CM

## 2019-10-24 NOTE — Progress Notes (Signed)
Obstetrics & Gynecology Office Visit   Chief Complaint:  Chief Complaint  Patient presents with  . Postpartum Care    hurting really bad lower abdomen; went to ED left after 8hrs; lightheadedness.    History of Present Illness: 24 y.o. G48P2002 female who is postpartum day #12 s/p SVD on 7/17.  Her pregnancy was complicated by morbid obesity with BMI 41, pelvic and back pain since mid second trimester (received PT), history of macrosomia with first baby, history of gestational hypertension with G1, history of depression, rectocele, and bleeding hemorrhoids.   She had an uncomplicated labor and delivery course and was discharged to home on PPD#1. She was started on Zoloft prior to discharge to try to prevent worsening of her anxiety and depression postpartum.  She was seen by Dr Jean Rosenthal on 10/14/2019 for several concerns: lightheadedness , headaches, chest tightness when changing positions. No etiology for her concerns was found. On 7/23 she went to the ER for vaginal pain and increased bleeding, but left after having some blood work done (left without being seen due to the long wait). Her WBC was 10.7K, H&H 13.1/40%and troponin was negative as was her CMP. Her urinalysis had RBC and WBCs.  She reports that her bleeding has slowed, but she has been having some lower abdominal pain intermittently. No fever/ chills. Is pumping every 2 hours while awake and feeding baby via bottle. She reports feeling irritable/angry in the Am and is crying quite often at night. She feels that her husband and her mother has been supportive and her husband's grandmother has been helping to watch her oldest child. She is not suicidal.   Review of Systems:  ROS   Past Medical History:  Past Medical History:  Diagnosis Date  . Bradycardia   . Depression   . GERD (gastroesophageal reflux disease)   . Hemorrhoids during pregnancy in third trimester   . Menometrorrhagia 08/07/2016  . Mental disorder   . Morbid  obesity (HCC)   . Psoriasis     Past Surgical History:  Past Surgical History:  Procedure Laterality Date  . INNER EAR SURGERY     right  . LAPAROSCOPY N/A 11/19/2018   Procedure: LAPAROSCOPY DIAGNOSTIC AND BIOPSY;  Surgeon: Nadara Mustard, MD;  Location: [facility] ORS;  Service: Gynecology;  Laterality: N/A;  . SHOULDER SURGERY     right    Gynecologic History: No LMP recorded (lmp unknown).  Obstetric History: [id number]  Family History:  Family History  Problem Relation Age of Onset  . Hypertension Father   . Diabetes Maternal Grandfather   . Heart attack Maternal Grandmother        x5  . Diabetes Paternal Grandfather     Social History:  Social History   Socioeconomic History  . Marital status: Married    Spouse name: Joselyn Glassman  . Number of children: 1  . Years of education: Not on file  . Highest education level: Not on file  Occupational History  . Not on file  Tobacco Use  . Smoking status: Never Smoker  . Smokeless tobacco: Never Used  Vaping Use  . Vaping Use: Never used  Substance and Sexual Activity  . Alcohol use: No    Alcohol/week: 0.0 standard drinks  . Drug use: No  . Sexual activity: Not Currently    Birth control/protection: None  Other Topics Concern  . Not on file  Social History Narrative  . Not on file   Social Determinants of Health  Financial Resource Strain:   . Difficulty of Paying Living Expenses:   Food Insecurity:   . Worried About Programme researcher, broadcasting/film/video in the Last Year:   . Barista in the Last Year:   Transportation Needs:   . Freight forwarder (Medical):   Marland Kitchen Lack of Transportation (Non-Medical):   Physical Activity:   . Days of Exercise per Week:   . Minutes of Exercise per Session:   Stress:   . Feeling of Stress :   Social Connections:   . Frequency of Communication with Friends and Family:   . Frequency of Social Gatherings with Friends and Family:   . Attends Religious Services:   . Active Member of Clubs  or Organizations:   . Attends Banker Meetings:   Marland Kitchen Marital Status:   Intimate Partner Violence:   . Fear of Current or Ex-Partner:   . Emotionally Abused:   Marland Kitchen Physically Abused:   . Sexually Abused:     Allergies:  Allergies  Allergen Reactions  . Honeysuckle Flower [Lonicera] Swelling, Rash and Other (See Comments)    Other Reaction: BREAK OUT, FACIAL SWELLING, WE  . Amoxicillin Nausea And Vomiting    Did it involve swelling of the face/tongue/throat, SOB, or low BP? No Did it involve sudden or severe rash/hives, skin peeling, or any reaction on the inside of your mouth or nose? No Did you need to seek medical attention at a hospital or doctor's office? No When did it last happen?Can tolerate but gets an upset stomach If all above answers are "NO", may proceed with cephalosporin use.     Medications: Prior to Admission medications   Medication Sig Start Date End Date Taking? Authorizing Provider  calcium carbonate (TUMS - DOSED IN MG ELEMENTAL CALCIUM) 500 MG chewable tablet Chew 1 tablet by mouth 2 (two) times daily as needed for indigestion or heartburn.   Yes [provider]  folic acid (FOLVITE) 1 MG tablet Take 1 tablet (1 mg total) by mouth daily. 02/09/19  Yes Vena Austria, MD  hydrocortisone-pramoxine [facility]) 2.5-1 % rectal cream Place 1 application rectally in the morning, at noon, and at bedtime. 10/05/19  Yes [provider]  pantoprazole (PROTONIX) 20 MG tablet TAKE 1 TABLET BY MOUTH EVERY DAY AS NEEDED 08/19/19  Yes Nadara Mustard, MD  Prenatal-DSS-FeCb-FeGl-FA (CITRANATAL BLOOM) 90-1 MG TABS Take 1 tablet by mouth daily. 03/10/19  Yes Vena Austria, MD  sertraline (ZOLOFT) 50 MG tablet Take 1 tablet (50 mg total) by mouth daily. 10/10/19 10/09/20 Yes Mirna Mires, CNM   EPDS:   Inocente Salles Postnatal Depression Scale - 10/21/19 0950      Edinburgh Postnatal Depression Scale:  In the Past 7 Days   I have been  able to laugh and see the funny side of things. 1    I have looked forward with enjoyment to things. 1    I have blamed myself unnecessarily when things went wrong. 3    I have been anxious or worried for no good reason. 3    I have felt scared or panicky for no good reason. 3    Things have been getting on top of me. 2    I have been so unhappy that I have had difficulty sleeping. 2    I have felt sad or miserable. 2    I have been so unhappy that I have been crying. 2    The thought of harming myself has  occurred to me. 0    Edinburgh Postnatal Depression Scale Total 19          Her EPDS score at discharge was 10 Physical Exam Vitals: BP (!) 108/60   Pulse 61   Temp 98.9 F (37.2 C)   Ht 5\' 7"  (1.702 m)   Wt (!) 241 lb (109.3 kg)   LMP  (LMP Unknown)   BMI 37.75 kg/m       Physical Exam Constitutional:      General: She is not in acute distress.    Appearance: She is not ill-appearing.     Comments: Well groomed  HENT:     Head: Normocephalic and atraumatic.  Cardiovascular:     Rate and Rhythm: Normal rate and regular rhythm.     Heart sounds: Normal heart sounds.  Pulmonary:     Effort: Pulmonary effort is normal.     Breath sounds: Normal breath sounds.  Abdominal:     Comments: Soft, nontender, unable to palpate fundal height, no guarding  Neurological:     Mental Status: She is alert.      Assessment: 24 y.o. 30 with worsening symptoms of depression and anxiety  Plan: Increase Zoloft to 75 mgm daily. Try to get some alone time for a short while during the day or weekend Follow up at her 6 week postpartum check. Can insert Mirena IUD at that time.  [id number], CNM

## 2019-10-25 MED ORDER — SERTRALINE HCL 50 MG PO TABS
75.0000 mg | ORAL_TABLET | Freq: Every day | ORAL | 11 refills | Status: DC
Start: 2019-10-25 — End: 2019-11-22

## 2019-10-25 NOTE — Addendum Note (Signed)
Addended by: Farrel Conners on: 10/25/2019 06:34 PM   Modules accepted: Orders

## 2019-10-27 ENCOUNTER — Encounter: Payer: 59 | Admitting: Physical Therapy

## 2019-11-16 NOTE — Progress Notes (Deleted)
Postpartum Visit  Chief Complaint: No chief complaint on file.   History of Present Illness: Isabel Higgins is a 24 y.o. [phone] presents for postpartum visit.  Date of delivery: *** Type of delivery: Vaginal delivery - Vacuum or forceps assisted  {yes/no:63} Episiotomy No.  Laceration: {yes/no:63}  Pregnancy or labor problems:  {yes/no:63} Any problems since the delivery:  {yes/no:63}  Newborn Details:  SINGLETON :  1. Baby's name: ***. Birth weight: *** Maternal Details:  Breast Feeding:  {yes/no:63} Post partum depression/anxiety noted:  {yes/no:63} Edinburgh Post-Partum Depression Score:  {numbers 5-02:77412}  Date of last PAP: ***  {norm/abn:16337}   Review of Systems: ROS  Past Medical History:  Past Medical History:  Diagnosis Date  . Bradycardia   . Depression   . GERD (gastroesophageal reflux disease)   . Hemorrhoids during pregnancy in third trimester   . Menometrorrhagia 08/07/2016  . Mental disorder   . Morbid obesity (HCC)   . Psoriasis     Past Surgical History:  Past Surgical History:  Procedure Laterality Date  . INNER EAR SURGERY     right  . LAPAROSCOPY N/A 11/19/2018   Procedure: LAPAROSCOPY DIAGNOSTIC AND BIOPSY;  Surgeon: Nadara Mustard, MD;  Location: [facility] ORS;  Service: Gynecology;  Laterality: N/A;  . SHOULDER SURGERY     right    Family History:  Family History  Problem Relation Age of Onset  . Hypertension Father   . Diabetes Maternal Grandfather   . Heart attack Maternal Grandmother        x5  . Diabetes Paternal Grandfather     Social History:  Social History   Socioeconomic History  . Marital status: Married    Spouse name: Joselyn Glassman  . Number of children: 1  . Years of education: Not on file  . Highest education level: Not on file  Occupational History  . Not on file  Tobacco Use  . Smoking status: Never Smoker  . Smokeless tobacco: Never Used  Vaping Use  . Vaping Use: Never used  Substance and Sexual  Activity  . Alcohol use: No    Alcohol/week: 0.0 standard drinks  . Drug use: No  . Sexual activity: Not Currently    Birth control/protection: None  Other Topics Concern  . Not on file  Social History Narrative  . Not on file   Social Determinants of Health   Financial Resource Strain:   . Difficulty of Paying Living Expenses: Not on file  Food Insecurity:   . Worried About Programme researcher, broadcasting/film/video in the Last Year: Not on file  . Ran Out of Food in the Last Year: Not on file  Transportation Needs:   . Lack of Transportation (Medical): Not on file  . Lack of Transportation (Non-Medical): Not on file  Physical Activity:   . Days of Exercise per Week: Not on file  . Minutes of Exercise per Session: Not on file  Stress:   . Feeling of Stress : Not on file  Social Connections:   . Frequency of Communication with Friends and Family: Not on file  . Frequency of Social Gatherings with Friends and Family: Not on file  . Attends Religious Services: Not on file  . Active Member of Clubs or Organizations: Not on file  . Attends Banker Meetings: Not on file  . Marital Status: Not on file  Intimate Partner Violence:   . Fear of Current or Ex-Partner: Not on file  . Emotionally  Abused: Not on file  . Physically Abused: Not on file  . Sexually Abused: Not on file    Allergies:  Allergies  Allergen Reactions  . Honeysuckle Flower [Lonicera] Swelling, Rash and Other (See Comments)    Other Reaction: BREAK OUT, FACIAL SWELLING, WE  . Amoxicillin Nausea And Vomiting    Did it involve swelling of the face/tongue/throat, SOB, or low BP? No Did it involve sudden or severe rash/hives, skin peeling, or any reaction on the inside of your mouth or nose? No Did you need to seek medical attention at a hospital or doctor's office? No When did it last happen?Can tolerate but gets an upset stomach If all above answers are "NO", may proceed with cephalosporin use.      Medications: Prior to Admission medications   Medication Sig Start Date End Date Taking? Authorizing Provider  calcium carbonate (TUMS - DOSED IN MG ELEMENTAL CALCIUM) 500 MG chewable tablet Chew 1 tablet by mouth 2 (two) times daily as needed for indigestion or heartburn.    [provider]  folic acid (FOLVITE) 1 MG tablet Take 1 tablet (1 mg total) by mouth daily. 02/09/19   Vena Austria, MD  hydrocortisone-pramoxine [facility]) 2.5-1 % rectal cream Place 1 application rectally in the morning, at noon, and at bedtime. 10/05/19   [provider]  pantoprazole (PROTONIX) 20 MG tablet TAKE 1 TABLET BY MOUTH EVERY DAY AS NEEDED 08/19/19   Nadara Mustard, MD  Prenatal-DSS-FeCb-FeGl-FA (CITRANATAL BLOOM) 90-1 MG TABS Take 1 tablet by mouth daily. 03/10/19   Vena Austria, MD  sertraline (ZOLOFT) 50 MG tablet Take 1.5 tablets (75 mg total) by mouth daily. 10/25/19 10/24/20  Farrel Conners, CNM    Physical Exam Vitals: There were no vitals filed for this visit.  General: NAD HEENT: normocephalic, anicteric Neck: No thyroid enlargement, no palpable nodules, no cervical lymphadenpathy Breast: Lactating, no inflammation, no masses, nipples intact Pulmonary: No increased work of breathing, CTAB Abdomen: Soft, non-tender, non-distended.  Umbilicus without lesions.  No hepatomegaly or masses palpable. No evidence of hernia. Genitourinary:  External: Well healed perineum, no lesions or inflammation    Vagina: Normal vaginal mucosa, no evidence of prolapse.    Cervix: Grossly normal in appearance, no bleeding  Uterus: Well involuted, mobile, non-tender  Adnexa: No adnexal masses, non-tender  Rectal: deferred Extremities: no edema, erythema, or tenderness Neurologic: Grossly intact Psychiatric: mood appropriate, affect full  Assessment: 24 y.o. [id number] presenting for 6 week postpartum visit  Plan: ***  1) Contraception Education given regarding options for  contraception, including {contraceptive options (MU measure 33):20677}. Patient would like to use *** for contraception.  2)  Pap *** - ASCCP guidelines and rational discussed.  Patient opts for *** screening interval.  3) Patient underwent screening for postpartum depression with *** concerns noted.  4) Discussed return to normal activity, recommend continuing prenatal vitamins.  5) Follow up 1 year for routine annual exam.

## 2019-11-17 ENCOUNTER — Other Ambulatory Visit: Payer: Self-pay

## 2019-11-17 ENCOUNTER — Ambulatory Visit: Payer: 59 | Admitting: Certified Nurse Midwife

## 2019-11-22 ENCOUNTER — Other Ambulatory Visit: Payer: Self-pay

## 2019-11-22 ENCOUNTER — Other Ambulatory Visit (HOSPITAL_COMMUNITY)
Admission: RE | Admit: 2019-11-22 | Discharge: 2019-11-22 | Disposition: A | Discharge status: Home / Self Care | Payer: 59 | Source: Ambulatory Visit | Attending: Obstetrics & Gynecology | Admitting: Obstetrics & Gynecology

## 2019-11-22 ENCOUNTER — Encounter: Payer: Self-pay | Admitting: Obstetrics & Gynecology

## 2019-11-22 ENCOUNTER — Ambulatory Visit (INDEPENDENT_AMBULATORY_CARE_PROVIDER_SITE_OTHER): Payer: 59 | Admitting: Obstetrics & Gynecology

## 2019-11-22 VITALS — BP 120/80 | Ht 67.0 in | Wt 244.0 lb

## 2019-11-22 DIAGNOSIS — Z124 Encounter for screening for malignant neoplasm of cervix: Secondary | ICD-10-CM | POA: Insufficient documentation

## 2019-11-22 DIAGNOSIS — Z30430 Encounter for insertion of intrauterine contraceptive device: Secondary | ICD-10-CM | POA: Diagnosis not present

## 2019-11-22 MED ORDER — SERTRALINE HCL 50 MG PO TABS: 75.0000 mg | ORAL_TABLET | Freq: Every day | ORAL | 11 refills | Status: DC

## 2019-11-22 NOTE — Progress Notes (Signed)
  OBSTETRICS POSTPARTUM CLINIC PROGRESS NOTE  Subjective:     Isabel Higgins is a 24 y.o. G70P2002 female who presents for a postpartum visit. She is 6 weeks postpartum following a Term pregnancy or Uncomplicated pregnancy and delivery by Vaginal, no problems at delivery.  I have fully reviewed the prenatal and intrapartum course. Anesthesia: epidural.  Postpartum course has been complicated by uncomplicated.  Baby is feeding by Breast.  Bleeding: patient has not  resumed menses.  Bowel function is normal. Bladder function is normal.  Patient is not sexually active. Contraception method desired is IUD.  Postpartum depression screening: positive. Edinburgh 18.  Pt has been on Zoloft and this seems to help.  Mostly anxiety with 24 year old and newborn.  Goes back to work soon and has stress about job and one hour commute.  The following portions of the patient's history were reviewed and updated as appropriate: allergies, current medications, past family history, past medical history, past social history, past surgical history and problem list.  Review of Systems Pertinent items are noted in HPI.  Objective:    BP 120/80   Ht 5\' 7"  (1.702 m)   Wt 244 lb (110.7 kg)   BMI 38.22 kg/m   General:  alert and no distress   Breasts:  inspection negative, no nipple discharge or bleeding, no masses or nodularity palpable  Lungs: clear to auscultation bilaterally  Heart:  regular rate and rhythm, S1, S2 normal, no murmur, click, rub or gallop  Abdomen: soft, non-tender; bowel sounds normal; no masses,  no organomegaly.     Vulva:  normal  Vagina: normal vagina, no discharge, exudate, lesion, or erythema  Cervix:  no cervical motion tenderness and no lesions  Corpus: normal size, contour, position, consistency, mobility, non-tender  Adnexa:  normal adnexa and no mass, fullness, tenderness  Rectal Exam: Not performed.          Assessment:  Post Partum Care visit 1. Encounter for insertion of  mirena IUD See below  2. Postpartum examination following vaginal delivery  3. Screening for cervical cancer - Cytology - PAP   Plan:  See orders and Patient Instructions Resume all normal activities Follow up in: 4 weeks or as needed.    IUD PROCEDURE NOTE:  Isabel Higgins is a 24 y.o. [phone] here for IUD insertion. No GYN concerns.  Last pap smear was normal.  IUD Insertion Procedure Note Patient identified, informed consent performed, consent signed.   Discussed risks of irregular bleeding, cramping, infection, malpositioning or misplacement of the IUD outside the uterus which may require further procedure such as laparoscopy, risk of failure <1%. Time out was performed.  Urine pregnancy test negative.  A bimanual exam showed the uterus to be midposition.  Speculum placed in the vagina.  Cervix visualized.  Cleaned with Betadine x 2.  Grasped anteriorly with a single tooth tenaculum.  Uterus sounded to 7 cm.   Mirena IUD placed per manufacturer's recommendations.  Strings trimmed to 3 cm. Tenaculum was removed, good hemostasis noted.  Patient tolerated procedure well.   Patient was given post-procedure instructions.  She was advised to have backup contraception for one week.  Patient was also asked to check IUD strings periodically and follow up in 4 weeks for IUD check.  [id number], MD, Annamarie Major Ob/Gyn, [facility] 11/22/2019  9:19 AM

## 2019-11-22 NOTE — Patient Instructions (Signed)
Intrauterine Device Insertion, Care After  This sheet gives you information about how to care for yourself after your procedure. Your health care provider may also give you more specific instructions. If you have problems or questions, contact your health care provider. What can I expect after the procedure? After the procedure, it is common to have:  Cramps and pain in the abdomen.  Light bleeding (spotting) or heavier bleeding that is like your menstrual period. This may last for up to a few days.  Lower back pain.  Dizziness.  Headaches.  Nausea. Follow these instructions at home:  Before resuming sexual activity, check to make sure that you can feel the IUD string(s). You should be able to feel the end of the string(s) below the opening of your cervix. If your IUD string is in place, you may resume sexual activity. ? If you had a hormonal IUD inserted more than 7 days after your most recent period started, you will need to use a backup method of birth control for 7 days after IUD insertion. Ask your health care provider whether this applies to you.  Continue to check that the IUD is still in place by feeling for the string(s) after every menstrual period, or once a month.  Take over-the-counter and prescription medicines only as told by your health care provider.  Do not drive or use heavy machinery while taking prescription pain medicine.  Keep all follow-up visits as told by your health care provider. This is important. Contact a health care provider if:  You have bleeding that is heavier or lasts longer than a normal menstrual cycle.  You have a fever.  You have cramps or abdominal pain that get worse or do not get better with medicine.  You develop abdominal pain that is new or is not in the same area of earlier cramping and pain.  You feel lightheaded or weak.  You have abnormal or bad-smelling discharge from your vagina.  You have pain during sexual  activity.  You have any of the following problems with your IUD string(s): ? The string bothers or hurts you or your sexual partner. ? You cannot feel the string. ? The string has gotten longer.  You can feel the IUD in your vagina.  You think you may be pregnant, or you miss your menstrual period.  You think you may have an STI (sexually transmitted infection). Get help right away if:  You have flu-like symptoms.  You have a fever and chills.  You can feel that your IUD has slipped out of place. Summary  After the procedure, it is common to have cramps and pain in the abdomen. It is also common to have light bleeding (spotting) or heavier bleeding that is like your menstrual period.  Continue to check that the IUD is still in place by feeling for the string(s) after every menstrual period, or once a month.  Keep all follow-up visits as told by your health care provider. This is important.  Contact your health care provider if you have problems with your IUD string(s), such as the string getting longer or bothering you or your sexual partner. This information is not intended to replace advice given to you by your health care provider. Make sure you discuss any questions you have with your health care provider. Document Revised: 02/21/2017 Document Reviewed: 01/31/2016 Elsevier Patient Education  2020 Elsevier Inc.  

## 2019-11-23 LAB — CYTOLOGY - PAP: Diagnosis: NEGATIVE

## 2019-12-20 ENCOUNTER — Other Ambulatory Visit: Payer: Self-pay

## 2019-12-20 ENCOUNTER — Ambulatory Visit (INDEPENDENT_AMBULATORY_CARE_PROVIDER_SITE_OTHER): Payer: 59 | Admitting: Obstetrics & Gynecology

## 2019-12-20 ENCOUNTER — Encounter: Payer: Self-pay | Admitting: Obstetrics & Gynecology

## 2019-12-20 VITALS — BP 120/80 | Ht 67.0 in | Wt 242.0 lb

## 2019-12-20 DIAGNOSIS — Z30431 Encounter for routine checking of intrauterine contraceptive device: Secondary | ICD-10-CM | POA: Diagnosis not present

## 2019-12-20 NOTE — Progress Notes (Signed)
  History of Present Illness:  Isabel Higgins is a 24 y.o. that had a Mirena IUD placed approximately 4 weeks ago. Since that time, she states that she has had no pain and only light bleeding that has been intermittent.  Also reports bilateral axillary nodules.  She is breast feeding.  PMHx: She  has a past medical history of Bradycardia, Depression, GERD (gastroesophageal reflux disease), Hemorrhoids during pregnancy in third trimester, Menometrorrhagia (08/07/2016), Mental disorder, Morbid obesity (HCC), and Psoriasis. Also,  has a past surgical history that includes Inner ear surgery; Shoulder surgery; and laparoscopy (N/A, 11/19/2018)., family history includes Diabetes in her maternal grandfather and paternal grandfather; Heart attack in her maternal grandmother; Hypertension in her father.,  reports that she has never smoked. She has never used smokeless tobacco. She reports that she does not drink alcohol and does not use drugs. Current Meds  Medication Sig  . levonorgestrel (MIRENA) 20 MCG/24HR IUD 1 each by Intrauterine route once.  . Prenatal-DSS-FeCb-FeGl-FA (CITRANATAL BLOOM) 90-1 MG TABS Take 1 tablet by mouth daily.  . sertraline (ZOLOFT) 50 MG tablet Take 1.5 tablets (75 mg total) by mouth daily.  .  Also, is allergic to honeysuckle flower [lonicera] and amoxicillin..  Review of Systems  All other systems reviewed and are negative.   Physical Exam:  BP 120/80   Ht 5\' 7"  (1.702 m)   Wt 242 lb (109.8 kg)   BMI 37.90 kg/m  Body mass index is 37.9 kg/m. Constitutional: Well nourished, well developed female in no acute distress.  Abdomen: diffusely non tender to palpation, non distended, and no masses, hernias Neuro: Grossly intact Psych:  Normal mood and affect.   Breast- bilat axillary lymph node swelling, 1 cm or less in size, NT, no skin changes  Pelvic exam:  Two IUD strings present seen coming from the cervical os. EGBUS, vaginal vault and cervix: within normal  limits  Assessment: IUD strings present in proper location; pt doing well  Plan: She was told to continue to use barrier contraception, in order to prevent any STIs, and to take a home pregnancy test or call if she ever thinks she may be pregnant, and that her IUD expires in 6 years.  Monitor for axillary/ breast changes.  Likely benign LN changes.  She was amenable to this plan and we will see her back in 1 year/PRN.  A total of 20 minutes were spent face-to-face with the patient as well as preparation, review, communication, and documentation during this encounter.   [place], MD, Annamarie Major Ob/Gyn, [facility] 12/20/2019  4:55 PM

## 2020-02-02 NOTE — Telephone Encounter (Signed)
Yes, schedule appt to check

## 2020-02-02 NOTE — Telephone Encounter (Signed)
Should she come in for a IUD check?

## 2020-02-02 NOTE — Telephone Encounter (Signed)
Can you schedule appointment please

## 2020-02-02 NOTE — Telephone Encounter (Signed)
Patient scheduled for 2:50 on 02/04/20 with [facility]

## 2020-02-04 ENCOUNTER — Other Ambulatory Visit: Payer: Self-pay

## 2020-02-04 ENCOUNTER — Encounter: Payer: Self-pay | Admitting: Obstetrics & Gynecology

## 2020-02-04 ENCOUNTER — Ambulatory Visit (INDEPENDENT_AMBULATORY_CARE_PROVIDER_SITE_OTHER): Payer: 59 | Admitting: Obstetrics & Gynecology

## 2020-02-04 VITALS — BP 120/80 | Ht 67.0 in | Wt 248.0 lb

## 2020-02-04 DIAGNOSIS — N939 Abnormal uterine and vaginal bleeding, unspecified: Secondary | ICD-10-CM

## 2020-02-04 NOTE — Progress Notes (Signed)
HPI:      [name] is a 24 y.o. [phone] who LMP was No LMP recorded. (Menstrual status: IUD)., presents today for a problem visit.  She complains of menometrorrhagia that  began several weeks ago and its severity is described as moderate.  Mostly because for the last week she now has increased clots or tissue like secretions.  Bleeding itself is light just frequent (daily to every other day).  Mirena IUD placed pp 2-3 mos ago.  She also has known prolapse, after NSVD x2 large babies.  Unsure about future fertility desires.  PMHx: She  has a past medical history of Bradycardia, Depression, GERD (gastroesophageal reflux disease), Hemorrhoids during pregnancy in third trimester, Menometrorrhagia (08/07/2016), Mental disorder, Morbid obesity (HCC), and Psoriasis. Also,  has a past surgical history that includes Inner ear surgery; Shoulder surgery; and laparoscopy (N/A, 11/19/2018)., family history includes Diabetes in her maternal grandfather and paternal grandfather; Heart attack in her maternal grandmother; Hypertension in her father.,  reports that she has never smoked. She has never used smokeless tobacco. She reports that she does not drink alcohol and does not use drugs.  She  Current Outpatient Medications:  .  levonorgestrel (MIRENA) 20 MCG/24HR IUD, 1 each by Intrauterine route once., Disp: , Rfl:  .  Prenatal-DSS-FeCb-FeGl-FA (CITRANATAL BLOOM) 90-1 MG TABS, Take 1 tablet by mouth daily., Disp: 30 tablet, Rfl: 11 .  sertraline (ZOLOFT) 50 MG tablet, Take 1.5 tablets (75 mg total) by mouth daily., Disp: 45 tablet, Rfl: 11  Also, is allergic to honeysuckle flower [lonicera] and amoxicillin.  Review of Systems  All other systems reviewed and are negative.   Objective: BP 120/80   Ht 5\' 7"  (1.702 m)   Wt 248 lb (112.5 kg)   BMI 38.84 kg/m  Physical Exam Constitutional:      General: She is not in acute distress.    Appearance: She is well-developed. She is obese.    Genitourinary:     Pelvic exam was performed with patient supine.     Vagina and uterus normal.     No vaginal erythema or bleeding.     No cervical motion tenderness, discharge, polyp or nabothian cyst.     IUD strings visualized.     Uterus is mobile.     Uterus is not enlarged.     No uterine mass detected.    Uterus is midaxial.     No right or left adnexal mass present.     Right adnexa not tender.     Left adnexa not tender.     Genitourinary Comments: Mild POP  HENT:     Head: Normocephalic and atraumatic.     Nose: Nose normal.  Abdominal:     General: There is no distension.     Palpations: Abdomen is soft.     Tenderness: There is no abdominal tenderness.  Musculoskeletal:        General: Normal range of motion.  Neurological:     Mental Status: She is alert and oriented to person, place, and time.     Cranial Nerves: No cranial nerve deficit.  Skin:    General: Skin is warm and dry.  Psychiatric:        Attention and Perception: Attention normal.        Mood and Affect: Mood and affect normal.        Speech: Speech normal.        Behavior: Behavior normal.  Thought Content: Thought content normal.        Judgment: Judgment normal.     ASSESSMENT/PLAN:  menometrorrhagia  Likely adapting to Mirena hormones after pregnancy No sign of partial expulsion or complications from IUD [place] to further assess Allow time  Annamarie Major, MD, Merlinda Frederick Ob/Gyn, [facility] 02/04/2020  3:04 PM

## 2020-02-14 ENCOUNTER — Other Ambulatory Visit: Payer: Self-pay | Admitting: Obstetrics & Gynecology

## 2020-02-14 ENCOUNTER — Ambulatory Visit (INDEPENDENT_AMBULATORY_CARE_PROVIDER_SITE_OTHER): Payer: 59

## 2020-02-14 ENCOUNTER — Other Ambulatory Visit: Payer: Self-pay

## 2020-02-14 DIAGNOSIS — N939 Abnormal uterine and vaginal bleeding, unspecified: Secondary | ICD-10-CM

## 2020-03-30 ENCOUNTER — Telehealth (HOSPITAL_COMMUNITY): Payer: Self-pay | Admitting: Lactation Services

## 2020-03-30 ENCOUNTER — Telehealth: Payer: Self-pay

## 2020-03-30 NOTE — Telephone Encounter (Signed)
Patient is breastfeeding 27 month old. She is experiencing dry nipples. She has been using coconut oil on them, but they are bleeding and she is noticing blood clots when pumping. Her left breast is starting to hurt. Doesn't look/feel like mastitis. [phone]

## 2020-03-30 NOTE — Telephone Encounter (Signed)
LMVM TRC. 

## 2020-03-30 NOTE — Telephone Encounter (Signed)
This Patient called LC services at the advice of her OB due to Eczema issues, dry, cracked nipples, bleeding at times, and blood tinged milk.  Per mom has a hx of eczema, and with her 1st baby breast feeding did not experience Eczema only with this baby and breast feeding.  Per mom denies change in any soaps , eating habits.  Baby is spitty with her breast milk and not formula, where as when in the beginning baby was spitty with formula , not breast milk.  LC recommended keeping a food diary for herself x 1 week, pump with a dab of coconut oil on the nipple and areola. ( per mom using a #27 F ) and LC recommended decreasing down to #63f and see if it makes a difference with the cracking, dryness, and Eczema.  Make a dermatologist appt..  Per mom is using a hand free DEBP - she bought on [employer] hand to make it easier to pump at work . Has a DEBP - Medela. LC recommended switching off with Medela and see if it makes a difference.  LC encouraged to call [facility] office back if needed.

## 2020-03-30 NOTE — Telephone Encounter (Signed)
Patient returned call. [phone]

## 2020-03-30 NOTE — Telephone Encounter (Signed)
LMVM TRC. Inquiring about details (fever, redness, flu like symptoms) Advised should be evaluated either by [place] or PCP. Can also inquire w/Lactation at [facility].

## 2020-03-31 NOTE — Telephone Encounter (Signed)
Patient returning call.

## 2020-03-31 NOTE — Telephone Encounter (Signed)
Spoke w/patient. Advised needs to be seen. She did speak w/Lactation who suggested she contact her dermatologist. They do not have any openings until May. She works at [employer]. Advised to be seen in Acute care as there are no openings at our office today.

## 2020-05-01 ENCOUNTER — Other Ambulatory Visit: Payer: Self-pay | Admitting: Family Medicine

## 2020-05-01 DIAGNOSIS — R140 Abdominal distension (gaseous): Secondary | ICD-10-CM

## 2020-05-01 DIAGNOSIS — R748 Abnormal levels of other serum enzymes: Secondary | ICD-10-CM

## 2020-05-03 ENCOUNTER — Other Ambulatory Visit: Payer: Self-pay

## 2020-05-03 ENCOUNTER — Ambulatory Visit
Admission: RE | Admit: 2020-05-03 | Discharge: 2020-05-03 | Disposition: A | Discharge status: Home / Self Care | Payer: Managed Care, Other (non HMO) | Source: Ambulatory Visit | Attending: Family Medicine | Admitting: Family Medicine

## 2020-05-03 DIAGNOSIS — R748 Abnormal levels of other serum enzymes: Secondary | ICD-10-CM | POA: Diagnosis not present

## 2020-05-03 DIAGNOSIS — R140 Abdominal distension (gaseous): Secondary | ICD-10-CM | POA: Diagnosis present

## 2020-05-18 ENCOUNTER — Other Ambulatory Visit (HOSPITAL_COMMUNITY): Payer: Self-pay | Admitting: Otolaryngology

## 2020-05-18 ENCOUNTER — Other Ambulatory Visit: Payer: Self-pay | Admitting: Otolaryngology

## 2020-05-18 DIAGNOSIS — H9041 Sensorineural hearing loss, unilateral, right ear, with unrestricted hearing on the contralateral side: Secondary | ICD-10-CM

## 2020-06-02 ENCOUNTER — Ambulatory Visit
Admission: RE | Admit: 2020-06-02 | Discharge: 2020-06-02 | Disposition: A | Discharge status: Home / Self Care | Payer: Managed Care, Other (non HMO) | Source: Ambulatory Visit | Attending: Otolaryngology | Admitting: Otolaryngology

## 2020-06-02 ENCOUNTER — Other Ambulatory Visit: Payer: Self-pay

## 2020-06-02 DIAGNOSIS — H9041 Sensorineural hearing loss, unilateral, right ear, with unrestricted hearing on the contralateral side: Secondary | ICD-10-CM

## 2020-06-02 MED ORDER — GADOBUTROL 1 MMOL/ML IV SOLN
10.0000 mL | Freq: Once | INTRAVENOUS | Status: AC | PRN
  Administered 2020-06-02: 10 mL via INTRAVENOUS

## 2020-06-15 ENCOUNTER — Other Ambulatory Visit: Payer: Self-pay | Admitting: Otolaryngology

## 2020-06-15 DIAGNOSIS — H7091 Unspecified mastoiditis, right ear: Secondary | ICD-10-CM

## 2020-07-03 ENCOUNTER — Ambulatory Visit: Payer: Managed Care, Other (non HMO) | Attending: Otolaryngology

## 2020-07-25 ENCOUNTER — Ambulatory Visit: Payer: Managed Care, Other (non HMO)

## 2020-07-28 ENCOUNTER — Other Ambulatory Visit: Payer: Self-pay

## 2020-07-28 ENCOUNTER — Ambulatory Visit
Admission: RE | Admit: 2020-07-28 | Discharge: 2020-07-28 | Disposition: A | Discharge status: Home / Self Care | Payer: Managed Care, Other (non HMO) | Source: Ambulatory Visit | Attending: Otolaryngology | Admitting: Otolaryngology

## 2020-07-28 DIAGNOSIS — H7091 Unspecified mastoiditis, right ear: Secondary | ICD-10-CM | POA: Insufficient documentation

## 2020-10-31 ENCOUNTER — Other Ambulatory Visit: Payer: Self-pay | Admitting: Orthopedic Surgery

## 2020-10-31 DIAGNOSIS — M25531 Pain in right wrist: Secondary | ICD-10-CM

## 2020-11-01 ENCOUNTER — Other Ambulatory Visit: Payer: Self-pay

## 2020-11-01 ENCOUNTER — Ambulatory Visit
Admission: RE | Admit: 2020-11-01 | Discharge: 2020-11-01 | Disposition: A | Discharge status: Home / Self Care | Payer: 59 | Source: Ambulatory Visit | Attending: Orthopedic Surgery | Admitting: Orthopedic Surgery

## 2020-11-01 DIAGNOSIS — M25531 Pain in right wrist: Secondary | ICD-10-CM | POA: Diagnosis present

## 2020-11-22 ENCOUNTER — Ambulatory Visit: Payer: 59 | Admitting: Obstetrics & Gynecology

## 2020-12-26 ENCOUNTER — Ambulatory Visit: Payer: Medicaid Other | Admitting: Obstetrics & Gynecology

## 2021-01-12 ENCOUNTER — Other Ambulatory Visit: Payer: Self-pay | Admitting: Family Medicine

## 2021-01-12 ENCOUNTER — Other Ambulatory Visit: Payer: Self-pay

## 2021-01-12 ENCOUNTER — Ambulatory Visit
Admission: RE | Admit: 2021-01-12 | Discharge: 2021-01-12 | Disposition: A | Discharge status: Home / Self Care | Payer: Managed Care, Other (non HMO) | Source: Ambulatory Visit | Attending: Family Medicine | Admitting: Family Medicine

## 2021-01-12 DIAGNOSIS — M7989 Other specified soft tissue disorders: Secondary | ICD-10-CM

## 2021-01-12 DIAGNOSIS — R233 Spontaneous ecchymoses: Secondary | ICD-10-CM | POA: Diagnosis present

## 2021-01-12 DIAGNOSIS — M79605 Pain in left leg: Secondary | ICD-10-CM | POA: Diagnosis present

## 2021-01-20 ENCOUNTER — Emergency Department
Admission: EM | Admit: 2021-01-20 | Discharge: 2021-01-21 | Disposition: A | Discharge status: Home / Self Care | Payer: Managed Care, Other (non HMO) | Attending: Emergency Medicine | Admitting: Emergency Medicine

## 2021-01-20 ENCOUNTER — Other Ambulatory Visit: Payer: Self-pay

## 2021-01-20 ENCOUNTER — Encounter: Payer: Self-pay | Admitting: Radiology

## 2021-01-20 ENCOUNTER — Emergency Department: Payer: Managed Care, Other (non HMO)

## 2021-01-20 DIAGNOSIS — S99912A Unspecified injury of left ankle, initial encounter: Secondary | ICD-10-CM | POA: Diagnosis present

## 2021-01-20 DIAGNOSIS — Y92009 Unspecified place in unspecified non-institutional (private) residence as the place of occurrence of the external cause: Secondary | ICD-10-CM | POA: Diagnosis not present

## 2021-01-20 DIAGNOSIS — S80812A Abrasion, left lower leg, initial encounter: Secondary | ICD-10-CM | POA: Insufficient documentation

## 2021-01-20 DIAGNOSIS — W109XXA Fall (on) (from) unspecified stairs and steps, initial encounter: Secondary | ICD-10-CM | POA: Insufficient documentation

## 2021-01-20 DIAGNOSIS — S8002XA Contusion of left knee, initial encounter: Secondary | ICD-10-CM | POA: Insufficient documentation

## 2021-01-20 DIAGNOSIS — S93401A Sprain of unspecified ligament of right ankle, initial encounter: Secondary | ICD-10-CM | POA: Insufficient documentation

## 2021-01-20 DIAGNOSIS — W19XXXA Unspecified fall, initial encounter: Secondary | ICD-10-CM

## 2021-01-20 DIAGNOSIS — Y9301 Activity, walking, marching and hiking: Secondary | ICD-10-CM | POA: Insufficient documentation

## 2021-01-20 DIAGNOSIS — T07XXXA Unspecified multiple injuries, initial encounter: Secondary | ICD-10-CM

## 2021-01-20 NOTE — ED Triage Notes (Signed)
First nurse note: was walking down stairs and missed a step. C/o left knee and right ankle pain.

## 2021-01-20 NOTE — ED Triage Notes (Signed)
Pt. To ED via [facility] from home. C/o back, left leg and right ankle pain r/t trip and fall down 2-3 stairs. Pt. States she was carrying baby and tripped down the stairs. Pt. Denies LOC, states she does not think she hit her head but is unsure. Denies blood thinners. Pt. States she had head surgery in august. Denies visual disturbance or vomiting.

## 2021-01-21 ENCOUNTER — Emergency Department: Payer: Managed Care, Other (non HMO)

## 2021-01-21 DIAGNOSIS — S93401A Sprain of unspecified ligament of right ankle, initial encounter: Secondary | ICD-10-CM | POA: Diagnosis not present

## 2021-01-21 MED ORDER — ACETAMINOPHEN 500 MG PO TABS
1000.0000 mg | ORAL_TABLET | Freq: Once | ORAL | Status: AC
  Administered 2021-01-21: 1000 mg via ORAL
  Filled 2021-01-21: qty 2

## 2021-01-21 MED ORDER — BACITRACIN ZINC 500 UNIT/GM EX OINT
TOPICAL_OINTMENT | Freq: Once | CUTANEOUS | Status: AC
  Administered 2021-01-21: 3 via TOPICAL
  Filled 2021-01-21: qty 2.7

## 2021-01-21 NOTE — Discharge Instructions (Addendum)
You may alternate Tylenol 1000 mg every 6 hours as needed for pain, fever and Ibuprofen 800 mg every 8 hours as needed for pain, fever.  Please take Ibuprofen with food.  Do not take more than 4000 mg of Tylenol (acetaminophen) in a 24 hour period.  

## 2021-01-21 NOTE — ED Provider Notes (Signed)
[facility] Emergency Department Provider Note ____________________________________________   Event Date/Time   First MD Initiated Contact with Patient 01/20/21 2348     (approximate)  I have reviewed the triage vital signs and the nursing notes.   HISTORY  Chief Complaint Fall (Pt. To ED via medic from home. C/o back, left leg and right ankle pain r/t trip and fall down 2-3 stairs. Pt. States she was carrying baby and tripped down the stairs. Pt. Denies LOC, states she does not think she hit her head but is unsure. Denies blood thinners. Pt. States she had head surgery in august. Denies visual disturbance or vomiting.)    HPI Isabel Higgins is a 25 y.o. female with history of obesity, right inner ear surgery who presents to the emergency department after she lost her footing on the stairs while carrying her child and injured her right ankle and left knee.  Has multiple abrasions.  States her tetanus vaccine was updated about a year ago.  She did not hit her head or lose consciousness.  She is having some headache though.  No numbness, tingling or weakness.  No neck or back pain.  No chest or abdominal pain.  Has been able to ambulate.         Past Medical History:  Diagnosis Date   Bradycardia    Depression    GERD (gastroesophageal reflux disease)    Hemorrhoids during pregnancy in third trimester    Menometrorrhagia 08/07/2016   Mental disorder    Morbid obesity (HCC)    Psoriasis     Patient Active Problem List   Diagnosis Date Noted   Normal vaginal delivery of second pregnancy 10/09/2019   Postpartum care following vaginal delivery 10/09/2019   Encounter for induction of labor 10/08/2019   Bleeding external hemorrhoids 10/05/2019   [redacted] weeks gestation of pregnancy 09/27/2019   Headache in pregnancy 09/21/2019   Syncope 08/17/2019   Rectocele affecting obstetric care in second trimester 06/21/2019   Back pain affecting pregnancy in third  trimester 06/02/2019   Adult BMI 40.0-44.9 kg/sq m (HCC) 03/29/2019   History of gestational hypertension 02/09/2019   Maternal obesity, antepartum 02/09/2019   Supervision of high risk pregnancy, antepartum 02/09/2019   Endosalpingiosis 11/25/2018   Dysmenorrhea 11/13/2018   Pelvic pain 07/14/2018   Abnormal uterine bleeding 07/14/2018   POTS (postural orthostatic tachycardia syndrome) 03/06/2012    Past Surgical History:  Procedure Laterality Date   INNER EAR SURGERY     right   LAPAROSCOPY N/A 11/19/2018   Procedure: LAPAROSCOPY DIAGNOSTIC AND BIOPSY;  Surgeon: Nadara Mustard, MD;  Location: [facility] ORS;  Service: Gynecology;  Laterality: N/A;   SHOULDER SURGERY     right    Prior to Admission medications   Medication Sig Start Date End Date Taking? Authorizing Provider  levonorgestrel (MIRENA) 20 MCG/24HR IUD 1 each by Intrauterine route once.    [provider]  Prenatal-DSS-FeCb-FeGl-FA (CITRANATAL BLOOM) 90-1 MG TABS Take 1 tablet by mouth daily. 03/10/19   Vena Austria, MD  sertraline (ZOLOFT) 50 MG tablet Take 1.5 tablets (75 mg total) by mouth daily. 11/22/19 11/21/20  Nadara Mustard, MD    Allergies Honeysuckle flower [lonicera] and Amoxicillin  Family History  Problem Relation Age of Onset   Hypertension Father    Diabetes Maternal Grandfather    Heart attack Maternal Grandmother        x5   Diabetes Paternal Grandfather     Social History Social  History   Tobacco Use   Smoking status: Never   Smokeless tobacco: Never  Vaping Use   Vaping Use: Never used  Substance Use Topics   Alcohol use: No    Alcohol/week: 0.0 standard drinks   Drug use: No    Review of Systems Constitutional: No fever. Eyes: No visual changes. ENT: No sore throat. Cardiovascular: Denies chest pain. Respiratory: Denies shortness of breath. Gastrointestinal: No nausea, vomiting, diarrhea. Genitourinary: Negative for dysuria. Musculoskeletal: Negative for back  pain. Skin: Negative for rash. Neurological: Negative for focal weakness or numbness.   ____________________________________________   PHYSICAL EXAM:  VITAL SIGNS: ED Triage Vitals  Enc Vitals Group     BP 01/20/21 1911 (!) 137/93     Pulse Rate 01/20/21 1911 99     Resp 01/20/21 1911 20     Temp 01/20/21 1911 98.2 F (36.8 C)     Temp Source 01/20/21 1911 Oral     SpO2 01/20/21 1911 99 %     Weight 01/20/21 1916 261 lb (118.4 kg)     Height 01/20/21 1916 5\' 7"  (1.702 m)     Head Circumference --      Peak Flow --      Pain Score 01/20/21 1915 6     Pain Loc --      Pain Edu? --      Excl. in GC? --    CONSTITUTIONAL: Alert and oriented and responds appropriately to questions. Well-appearing; well-nourished; GCS 15 HEAD: Normocephalic; atraumatic EYES: Conjunctivae clear, PERRL, EOMI ENT: normal nose; no rhinorrhea; moist mucous membranes; pharynx without lesions noted; no dental injury; no septal hematoma NECK: Supple, no meningismus, no LAD; no midline spinal tenderness, step-off or deformity; trachea midline CARD: RRR; S1 and S2 appreciated; no murmurs, no clicks, no rubs, no gallops RESP: Normal chest excursion without splinting or tachypnea; breath sounds clear and equal bilaterally; no wheezes, no rhonchi, no rales; no hypoxia or respiratory distress CHEST:  chest wall stable, no crepitus or ecchymosis or deformity, nontender to palpation; no flail chest ABD/GI: Normal bowel sounds; non-distended; soft, non-tender, no rebound, no guarding; no ecchymosis or other lesions noted PELVIS:  stable, nontender to palpation BACK:  The back appears normal and is non-tender to palpation, there is no CVA tenderness; no midline spinal tenderness, step-off or deformity EXT: Tender to palpation over the right lateral ankle with soft tissue swelling but no bony deformity.  No ligamentous laxity.  No ecchymosis.  No tenderness of the right proximal fibular head.  Also tender to palpation  over the left knee with multiple abrasions to the left knee and shin.  Difficult to test ligamentous laxity of the left knee due to pain.  She has full range of motion in all joints.  2+ DP pulses bilaterally.  Compartments are soft. SKIN: Normal color for age and race; warm NEURO: Moves all extremities equally, normal sensation diffusely, no facial asymmetry, normal speech PSYCH: The patient's mood and manner are appropriate. Grooming and personal hygiene are appropriate.  ____________________________________________   LABS (all labs ordered are listed, but only abnormal results are displayed)  Labs Reviewed - No data to display ____________________________________________  EKG   ____________________________________________  RADIOLOGY I, Krystyn Picking, personally viewed and evaluated these images (plain radiographs) as part of my medical decision making, as well as reviewing the written report by the radiologist.  ED MD interpretation: X-rays show no fracture or dislocation.  Official radiology report(s): DG Knee Complete 4 Views Left  Result  Date: 01/21/2021 CLINICAL DATA:  Initial evaluation for acute trauma, fall. EXAM: LEFT KNEE - COMPLETE 4+ VIEW COMPARISON:  None. FINDINGS: No evidence of fracture, dislocation, or joint effusion. No evidence of arthropathy or other focal bone abnormality. Soft tissues are unremarkable. IMPRESSION: No acute osseous abnormality about the left knee. Electronically Signed   By: Rise Mu M.D.   On: 01/21/2021 00:53   DG Ankle Right Port  Result Date: 01/20/2021 CLINICAL DATA:  Acute RIGHT ankle pain following injury today. Initial encounter. EXAM: PORTABLE RIGHT ANKLE - 2 VIEW COMPARISON:  None. FINDINGS: No acute fracture, subluxation or dislocation identified. The joint space is unremarkable. LATERAL soft tissue swelling is noted. No focal bony lesions are present. IMPRESSION: LATERAL soft tissue swelling without acute bony abnormality.  Electronically Signed   By: Harmon Pier M.D.   On: 01/20/2021 19:51    ____________________________________________   PROCEDURES  Procedure(s) performed (including Critical Care):  Procedures    ____________________________________________   INITIAL IMPRESSION / ASSESSMENT AND PLAN / ED COURSE  As part of my medical decision making, I reviewed the following data within the electronic MEDICAL RECORD NUMBER History obtained from family, Nursing notes reviewed and incorporated, Old chart reviewed, Radiograph reviewed , and Notes from prior ED visits         Patient here with mechanical fall.  Complaining of right ankle and left knee pain.  X-ray of the right ankle was obtained from triage and shows no fracture, dislocation.  Requesting an x-ray of the left knee.  She does have multiple abrasions that we will clean and dress.  States her tetanus vaccination is up-to-date.  Neurovascularly intact distally.  Compartments soft.  No other sign of traumatic injury on exam.  ED PROGRESS  Patient's x-rays reviewed by myself and radiology and show no acute abnormality.  Will discharge home.  Offered to wrap an Ace wrap but she declines.  She declines anything stronger than Tylenol, ibuprofen for pain.  Recommended rest, elevation, ice.  Discussed return precautions.   At this time, I do not feel there is any life-threatening condition present. I have reviewed, interpreted and discussed all results (EKG, imaging, lab, urine as appropriate) and exam findings with patient/family. I have reviewed nursing notes and appropriate previous records.  I feel the patient is safe to be discharged home without further emergent workup and can continue workup as an outpatient as needed. Discussed usual and customary return precautions. Patient/family verbalize understanding and are comfortable with this plan.  Outpatient follow-up has been provided as needed. All questions have been  answered.    ____________________________________________   FINAL CLINICAL IMPRESSION(S) / ED DIAGNOSES  Final diagnoses:  Fall  Sprain of right ankle, unspecified ligament, initial encounter  Contusion of left knee, initial encounter  Multiple abrasions     ED Discharge Orders     None       *Please note:  Isabel Higgins was evaluated in Emergency Department on 01/21/2021 for the symptoms described in the history of present illness. She was evaluated in the context of the global COVID-19 pandemic, which necessitated consideration that the patient might be at risk for infection with the SARS-CoV-2 virus that causes COVID-19. Institutional protocols and algorithms that pertain to the evaluation of patients at risk for COVID-19 are in a state of rapid change based on information released by regulatory bodies including the CDC and federal and state organizations. These policies and algorithms were followed during the patient's care in the ED.  Some ED evaluations and interventions may be delayed as a result of limited staffing during and the pandemic.*   Note:  This document was prepared using Dragon voice recognition software and may include unintentional dictation errors.    Lillie Bollig, Layla Maw, DO 01/21/21 [phone]

## 2021-01-23 ENCOUNTER — Ambulatory Visit: Payer: Medicaid Other | Admitting: Obstetrics & Gynecology

## 2021-01-23 ENCOUNTER — Other Ambulatory Visit: Payer: Managed Care, Other (non HMO)

## 2021-02-12 ENCOUNTER — Encounter: Payer: Self-pay | Admitting: Obstetrics & Gynecology

## 2021-02-21 ENCOUNTER — Ambulatory Visit: Payer: Medicaid Other | Admitting: Obstetrics & Gynecology

## 2021-02-25 ENCOUNTER — Emergency Department (HOSPITAL_COMMUNITY)
Admission: EM | Admit: 2021-02-25 | Discharge: 2021-02-25 | Disposition: A | Discharge status: Home / Self Care | Payer: Managed Care, Other (non HMO) | Attending: Emergency Medicine | Admitting: Emergency Medicine

## 2021-02-25 ENCOUNTER — Other Ambulatory Visit: Payer: Self-pay

## 2021-02-25 ENCOUNTER — Encounter (HOSPITAL_COMMUNITY): Payer: Self-pay | Admitting: Emergency Medicine

## 2021-02-25 DIAGNOSIS — Z20822 Contact with and (suspected) exposure to covid-19: Secondary | ICD-10-CM | POA: Diagnosis not present

## 2021-02-25 DIAGNOSIS — J019 Acute sinusitis, unspecified: Secondary | ICD-10-CM | POA: Insufficient documentation

## 2021-02-25 DIAGNOSIS — J029 Acute pharyngitis, unspecified: Secondary | ICD-10-CM | POA: Diagnosis present

## 2021-02-25 LAB — GROUP A STREP BY PCR: Group A Strep by PCR: NOT DETECTED

## 2021-02-25 LAB — RESP PANEL BY RT-PCR (FLU A&B, COVID) ARPGX2
Influenza A by PCR: NEGATIVE
Influenza B by PCR: NEGATIVE
SARS Coronavirus 2 by RT PCR: NEGATIVE

## 2021-02-25 MED ORDER — AMOXICILLIN-POT CLAVULANATE 875-125 MG PO TABS: 1.0000 | ORAL_TABLET | Freq: Two times a day (BID) | ORAL | 0 refills | Status: DC

## 2021-02-25 NOTE — ED Triage Notes (Signed)
Pt states she lost her voice 1 week ago.  On Tuesday she started having a sore throat, white spots on tonsils, fever, and chills.  Reports negative COVID and flu test.  This morning she turned over in bed and L ear popped.  Now having L ear pain.

## 2021-02-25 NOTE — ED Provider Notes (Signed)
Emergency Medicine Provider Triage Evaluation Note  Isabel Higgins , a 25 y.o. female  was evaluated in triage.  Pt complains of sore throat x6 days.  She reports that her sore throat has been worsening within the past 4-5 days.  She has associated left ear pain, cough, painful swallowing.  She reports that she lost her voice last week prior to the onset of her symptoms.  She has had multiple negative COVID and flu test.  She denies fever, chills, chest pain, shortness of breath.  Review of Systems  Positive: Sore throat Negative: Chest pain  Physical Exam  BP 115/82 (BP Location: Left Arm)   Pulse 93   Temp 99.7 F (37.6 C)   Resp 16   SpO2 98%  Gen:   Awake, no distress   Resp:  Normal effort  MSK:   Moves extremities without difficulty  Other:  Patent airway.  No erythema or exudate noted to posterior pharynx.  Medical Decision Making  Medically screening exam initiated at 11:47 AM.  Appropriate orders placed.  [name] was informed that the remainder of the evaluation will be completed by another provider, this initial triage assessment does not replace that evaluation, and the importance of remaining in the ED until their evaluation is complete.   Elyjah Hazan A, PA-C 02/25/21 1149    Gerhard Munch, MD 02/25/21 [phone]

## 2021-02-25 NOTE — Discharge Instructions (Signed)
Likely have a sinusitis.  I have sent antibiotics into your pharmacy.  Continue warm liquids, warm water gargles, and lozenges for relief of your sore throat.  Your sore throat is likely a result of postnasal drainage from your sinus.  Your tonsils were not swollen.  If your symptoms worsen or you get to a point you are unable to swallow liquids or your saliva and or drooling please return to the emergency room for evaluation.  Otherwise I recommend you call your primary care provider and schedule an appointment to be reevaluated.

## 2021-02-25 NOTE — ED Provider Notes (Signed)
[facility] EMERGENCY DEPARTMENT Provider Note   CSN: [id number] Arrival date & time: 02/25/21  1055     History Chief Complaint  Patient presents with   Sore Throat   Ear Pain    Isabel Higgins is a 25 y.o. female.  25 year old female presents with 1 week duration of sinus congestion, postnasal drainage, sore throat.  She has been tested multiple times including today for COVID, flu, and strep and she has been negative every time.  She denies fever, chills, inability to tolerate p.o. intake, drooling, muffled voice.  She reports she transiently lost her voice last week and but since then has had no issues with her voice.  She is drinking plenty of fluids at home.  She reports warm liquids to help with her sore throat.  She bought a sinus cleanse earlier today.  She has been seen for similar symptoms but has not been treated.  The history is provided by the patient. No language interpreter was used.      Past Medical History:  Diagnosis Date   Bradycardia    Depression    GERD (gastroesophageal reflux disease)    Hemorrhoids during pregnancy in third trimester    Menometrorrhagia 08/07/2016   Mental disorder    Morbid obesity (HCC)    Psoriasis     Patient Active Problem List   Diagnosis Date Noted   Normal vaginal delivery of second pregnancy 10/09/2019   Postpartum care following vaginal delivery 10/09/2019   Encounter for induction of labor 10/08/2019   Bleeding external hemorrhoids 10/05/2019   [redacted] weeks gestation of pregnancy 09/27/2019   Headache in pregnancy 09/21/2019   Syncope 08/17/2019   Rectocele affecting obstetric care in second trimester 06/21/2019   Back pain affecting pregnancy in third trimester 06/02/2019   Adult BMI 40.0-44.9 kg/sq m (HCC) 03/29/2019   History of gestational hypertension 02/09/2019   Maternal obesity, antepartum 02/09/2019   Supervision of high risk pregnancy, antepartum 02/09/2019   Endosalpingiosis 11/25/2018    Dysmenorrhea 11/13/2018   Pelvic pain 07/14/2018   Abnormal uterine bleeding 07/14/2018   POTS (postural orthostatic tachycardia syndrome) 03/06/2012    Past Surgical History:  Procedure Laterality Date   INNER EAR SURGERY     right   LAPAROSCOPY N/A 11/19/2018   Procedure: LAPAROSCOPY DIAGNOSTIC AND BIOPSY;  Surgeon: Nadara Mustard, MD;  Location: [facility] ORS;  Service: Gynecology;  Laterality: N/A;   SHOULDER SURGERY     right     OB History     Gravida  2   Para  2   Term  2   Preterm  0   AB  0   Living  2      SAB      IAB      Ectopic      Multiple  0   Live Births  2           Family History  Problem Relation Age of Onset   Hypertension Father    Diabetes Maternal Grandfather    Heart attack Maternal Grandmother        x5   Diabetes Paternal Grandfather     Social History   Tobacco Use   Smoking status: Never   Smokeless tobacco: Never  Vaping Use   Vaping Use: Never used  Substance Use Topics   Alcohol use: No    Alcohol/week: 0.0 standard drinks   Drug use: No    Home Medications Prior to Admission  medications   Medication Sig Start Date End Date Taking? Authorizing Provider  amoxicillin-clavulanate (AUGMENTIN) 875-125 MG tablet Take 1 tablet by mouth every 12 (twelve) hours. 02/25/21  Yes Karie Mainland, Floye Fesler, PA-C  levonorgestrel (MIRENA) 20 MCG/24HR IUD 1 each by Intrauterine route once.    [provider]  Prenatal-DSS-FeCb-FeGl-FA (CITRANATAL BLOOM) 90-1 MG TABS Take 1 tablet by mouth daily. 03/10/19   Vena Austria, MD  sertraline (ZOLOFT) 50 MG tablet Take 1.5 tablets (75 mg total) by mouth daily. 11/22/19 11/21/20  Nadara Mustard, MD    Allergies    Honeysuckle flower [lonicera] and Amoxicillin  Review of Systems   Review of Systems  Constitutional:  Negative for chills and fever.  HENT:  Positive for congestion, postnasal drip and sore throat. Negative for trouble swallowing.   Respiratory:  Positive for cough.  Negative for shortness of breath.   All other systems reviewed and are negative.  Physical Exam Updated Vital Signs BP 115/82 (BP Location: Left Arm)   Pulse 93   Temp 99.7 F (37.6 C)   Resp 16   SpO2 98%   Physical Exam Vitals and nursing note reviewed.  Constitutional:      General: She is not in acute distress.    Appearance: Normal appearance. She is not ill-appearing.  HENT:     Head: Normocephalic and atraumatic.     Right Ear: Tympanic membrane and ear canal normal. Tympanic membrane is not erythematous.     Left Ear: Tympanic membrane and ear canal normal. Tympanic membrane is not erythematous.     Nose: Nose normal.     Mouth/Throat:     Pharynx: Oropharynx is clear. Uvula midline. No pharyngeal swelling, oropharyngeal exudate, posterior oropharyngeal erythema or uvula swelling.     Tonsils: No tonsillar exudate or tonsillar abscesses.  Eyes:     General: No scleral icterus.    Extraocular Movements: Extraocular movements intact.     Conjunctiva/sclera: Conjunctivae normal.  Cardiovascular:     Rate and Rhythm: Normal rate and regular rhythm.     Pulses: Normal pulses.     Heart sounds: Normal heart sounds.  Pulmonary:     Effort: Pulmonary effort is normal. No respiratory distress.     Breath sounds: Normal breath sounds. No wheezing or rales.  Abdominal:     General: There is no distension.     Tenderness: There is no abdominal tenderness.  Musculoskeletal:        General: Normal range of motion.     Cervical back: Normal range of motion.  Skin:    General: Skin is warm and dry.  Neurological:     General: No focal deficit present.     Mental Status: She is alert. Mental status is at baseline.    ED Results / Procedures / Treatments   Labs (all labs ordered are listed, but only abnormal results are displayed) Labs Reviewed  RESP PANEL BY RT-PCR (FLU A&B, COVID) [id number]  GROUP A STREP BY PCR    EKG None  Radiology No results  found.  Procedures Procedures   Medications Ordered in ED Medications - No data to display  ED Course  I have reviewed the triage vital signs and the nursing notes.  Pertinent labs & imaging results that were available during my care of the patient were reviewed by me and considered in my medical decision making (see chart for details).    MDM Rules/Calculators/A&P  25 year old female presents with 1 week duration of sinus congestion, postnasal drainage, and sore throat.  Respiratory panel negative for COVID, flu.  Strep swab negative.  Patient likely has sinusitis.  Sore throat secondary to postnasal drainage.  No sign of peritonsillar abscess or tonsillitis on exam.  Will treat with Augmentin.  Return precautions discussed.  Symptomatic treatment discussed.  Patient will follow-up with PCP.  She voices understanding and is in agreement with plan.  Final Clinical Impression(s) / ED Diagnoses Final diagnoses:  Acute sinusitis, recurrence not specified, unspecified location    Rx / DC Orders ED Discharge Orders          Ordered    amoxicillin-clavulanate (AUGMENTIN) 875-125 MG tablet  Every 12 hours        02/25/21 1318             Marita Kansas, PA-C 02/25/21 1321    Benjiman Core, MD 02/25/21 1909

## 2021-02-25 NOTE — ED Notes (Signed)
Patient discharge instructions reviewed with the patient, the patient verbalized understanding of instructions. Patient discharged. ?

## 2021-03-15 ENCOUNTER — Other Ambulatory Visit: Payer: Self-pay | Admitting: Otolaryngology

## 2021-03-15 DIAGNOSIS — H838X9 Other specified diseases of inner ear, unspecified ear: Secondary | ICD-10-CM

## 2021-03-22 ENCOUNTER — Ambulatory Visit (INDEPENDENT_AMBULATORY_CARE_PROVIDER_SITE_OTHER): Payer: Managed Care, Other (non HMO) | Admitting: Obstetrics & Gynecology

## 2021-03-22 ENCOUNTER — Other Ambulatory Visit (HOSPITAL_COMMUNITY)
Admission: RE | Admit: 2021-03-22 | Discharge: 2021-03-22 | Disposition: A | Discharge status: Home / Self Care | Payer: Managed Care, Other (non HMO) | Source: Ambulatory Visit | Attending: Obstetrics & Gynecology | Admitting: Obstetrics & Gynecology

## 2021-03-22 ENCOUNTER — Other Ambulatory Visit: Payer: Self-pay

## 2021-03-22 ENCOUNTER — Encounter: Payer: Self-pay | Admitting: Obstetrics & Gynecology

## 2021-03-22 VITALS — BP 120/80 | Ht 67.0 in | Wt 258.0 lb

## 2021-03-22 DIAGNOSIS — Z30431 Encounter for routine checking of intrauterine contraceptive device: Secondary | ICD-10-CM | POA: Diagnosis not present

## 2021-03-22 DIAGNOSIS — Z124 Encounter for screening for malignant neoplasm of cervix: Secondary | ICD-10-CM | POA: Diagnosis not present

## 2021-03-22 DIAGNOSIS — Z01419 Encounter for gynecological examination (general) (routine) without abnormal findings: Secondary | ICD-10-CM | POA: Diagnosis not present

## 2021-03-22 NOTE — Progress Notes (Signed)
HPI:      Ms. ANALIAH DRUM is a 25 y.o. [phone] who LMP was No LMP recorded. (Menstrual status: IUD)., she presents today for her annual examination. The patient has no complaints today.  She has occas RLQ pain that is fleeting and not associated w other sx's.  She feels she has sx's of uterine prolapse as well.  No urinary retention or leakage.  The patient  is sexually active. Her last pap: was normal. The patient does perform self breast exams.  There is no notable family history of breast or ovarian cancer in her family.  The patient has regular exercise: yes.  The patient denies current symptoms of depression.    GYN History: Contraception: IUD (Mirena yr 2).  Rare bleeding  PMHx: Past Medical History:  Diagnosis Date   Bradycardia    Depression    GERD (gastroesophageal reflux disease)    Hemorrhoids during pregnancy in third trimester    Menometrorrhagia 08/07/2016   Mental disorder    Morbid obesity (HCC)    Psoriasis    Past Surgical History:  Procedure Laterality Date   INNER EAR SURGERY     right   LAPAROSCOPY N/A 11/19/2018   Procedure: LAPAROSCOPY DIAGNOSTIC AND BIOPSY;  Surgeon: Nadara Mustard, MD;  Location: [facility] ORS;  Service: Gynecology;  Laterality: N/A;   SHOULDER SURGERY     right   Family History  Problem Relation Age of Onset   Hypertension Father    Diabetes Maternal Grandfather    Heart attack Maternal Grandmother        x5   Diabetes Paternal Grandfather    Social History   Tobacco Use   Smoking status: Never   Smokeless tobacco: Never  Vaping Use   Vaping Use: Never used  Substance Use Topics   Alcohol use: No    Alcohol/week: 0.0 standard drinks   Drug use: No    Current Outpatient Medications:    HUMIRA PEN 40 MG/0.4ML PNKT, SMARTSIG:40 Milligram(s) SUB-Q Every 2 Weeks, Disp: , Rfl:    levonorgestrel (MIRENA) 20 MCG/24HR IUD, 1 each by Intrauterine route once., Disp: , Rfl:    metFORMIN (GLUCOPHAGE) 500 MG tablet, Take by mouth.,  Disp: , Rfl:    venlafaxine (EFFEXOR) 75 MG tablet, TAKE 1 TABLET BY MOUTH ONCE DAILY FOR HEADACHES, Disp: , Rfl:    sertraline (ZOLOFT) 50 MG tablet, Take 1.5 tablets (75 mg total) by mouth daily., Disp: 45 tablet, Rfl: 11 Allergies: Honeysuckle flower [lonicera] and Amoxicillin  Review of Systems  Constitutional:  Negative for chills, fever and malaise/fatigue.  HENT:  Negative for congestion, sinus pain and sore throat.   Eyes:  Negative for blurred vision and pain.  Respiratory:  Negative for cough and wheezing.   Cardiovascular:  Negative for chest pain and leg swelling.  Gastrointestinal:  Negative for abdominal pain, constipation, diarrhea, heartburn, nausea and vomiting.  Genitourinary:  Negative for dysuria, frequency, hematuria and urgency.  Musculoskeletal:  Negative for back pain, joint pain, myalgias and neck pain.  Skin:  Negative for itching and rash.  Neurological:  Negative for dizziness, tremors and weakness.  Endo/Heme/Allergies:  Does not bruise/bleed easily.  Psychiatric/Behavioral:  Negative for depression. The patient is not nervous/anxious and does not have insomnia.    Objective: BP 120/80    Ht 5\' 7"  (1.702 m)    Wt 258 lb (117 kg)    BMI 40.41 kg/m   Filed Weights   03/22/21 1550  Weight: 258 lb (  117 kg)   Body mass index is 40.41 kg/m. Physical Exam Constitutional:      General: She is not in acute distress.    Appearance: She is well-developed.  Genitourinary:     Bladder, rectum and urethral meatus normal.     No lesions in the vagina.     Right Labia: No rash, tenderness or lesions.    Left Labia: No tenderness, lesions or rash.    No vaginal bleeding.      Right Adnexa: not tender and no mass present.    Left Adnexa: not tender and no mass present.    Adnexa exam comments: No mass.     No cervical motion tenderness, friability, lesion or polyp.     IUD strings visualized.     Uterus is prolapsed.     Uterus is not enlarged.     No uterine  mass detected.    Uterus exam comments: Mild (Gr 1).     Pelvic exam was performed with patient in the lithotomy position.  Breasts:    Right: No mass, skin change or tenderness.     Left: No mass, skin change or tenderness.  HENT:     Head: Normocephalic and atraumatic. No laceration.     Right Ear: Hearing normal.     Left Ear: Hearing normal.     Mouth/Throat:     Pharynx: Uvula midline.  Eyes:     Pupils: Pupils are equal, round, and reactive to light.  Neck:     Thyroid: No thyromegaly.  Cardiovascular:     Rate and Rhythm: Normal rate and regular rhythm.     Heart sounds: No murmur heard.   No friction rub. No gallop.  Pulmonary:     Effort: Pulmonary effort is normal. No respiratory distress.     Breath sounds: Normal breath sounds. No wheezing.  Abdominal:     General: Bowel sounds are normal. There is no distension.     Palpations: Abdomen is soft.     Tenderness: There is no abdominal tenderness. There is no rebound.  Musculoskeletal:        General: Normal range of motion.     Cervical back: Normal range of motion and neck supple.  Neurological:     Mental Status: She is alert and oriented to person, place, and time.     Cranial Nerves: No cranial nerve deficit.  Skin:    General: Skin is warm and dry.  Psychiatric:        Judgment: Judgment normal.  Vitals reviewed.    Assessment:  ANNUAL EXAM 1. Women's annual routine gynecological examination   2. Screening for cervical cancer   3. IUD check up      Screening Plan:            1.  Cervical Screening-  Pap smear done today  2. Breast screening- Exam annually and mammogram>40 planned   3. Colonoscopy every 10 years, Hemoccult testing - after age 84  4. Labs managed by PCP  5. Counseling for contraception: IUD  Upstream - 03/22/21 1553       Pregnancy Intention Screening   Does the patient want to become pregnant in the next year? No    Does the patient's partner want to become pregnant in the  next year? No    Would the patient like to discuss contraceptive options today? No      Contraception Wrap Up   Current Method IUD or IUS  End Method IUD or IUS    Contraception Counseling Provided No            The pregnancy intention screening data noted above was reviewed. Potential methods of contraception were discussed. The patient elected to proceed with IUD or IUS.     F/U  Return in about 1 year (around 03/22/2022) for Annual.  Barnett Applebaum, MD, [facility], [facility] 03/22/2021  4:12 PM

## 2021-03-22 NOTE — Patient Instructions (Signed)
PAP every three years Mammogram every year    Call [phone] to schedule at [place] Colonoscopy every 10 years Labs yearly (with PCP)  Thank you for choosing [facility]. As part of our ongoing efforts to improve patient experience, we would appreciate your feedback. Please fill out the short survey that you will receive by mail or MyChart. Your opinion is important to us! - Dr. Laniesha Das  Recommendations to boost your immunity to prevent illness such as viral flu and colds, including covid19, are as follows:       - - -  Vitamin K2 and Vitamin D3  - - - Take Vitamin K2 at 200-300 mcg daily (usually 2-3 pills daily of the over the counter formulation). Take Vitamin D3 at 3000-4000 U daily (usually 3-4 pills daily of the over the counter formulation). Studies show that these two at high normal levels in your system are very effective in keeping your immunity so strong and protective that you will be unlikely to contract viral illness such as those listed above.  Dr Jolene Guyett  

## 2021-03-23 ENCOUNTER — Other Ambulatory Visit: Payer: Managed Care, Other (non HMO)

## 2021-03-27 LAB — CYTOLOGY - PAP: Diagnosis: NEGATIVE

## 2021-03-28 IMAGING — CR DG CHEST 2V
2 series · 2 of 2 positions shown · non-contrast
Comparison: Prior chest radiographs 01/16/2012 and earlier

CLINICAL DATA: Chest pain. Additional provided: Patient reports
abdominal cramping, excessive bleeding, shortness of breath, recent
vaginal birth 6 days ago. History of bradycardia.

EXAM:
CHEST - 2 VIEW

[chest pa]
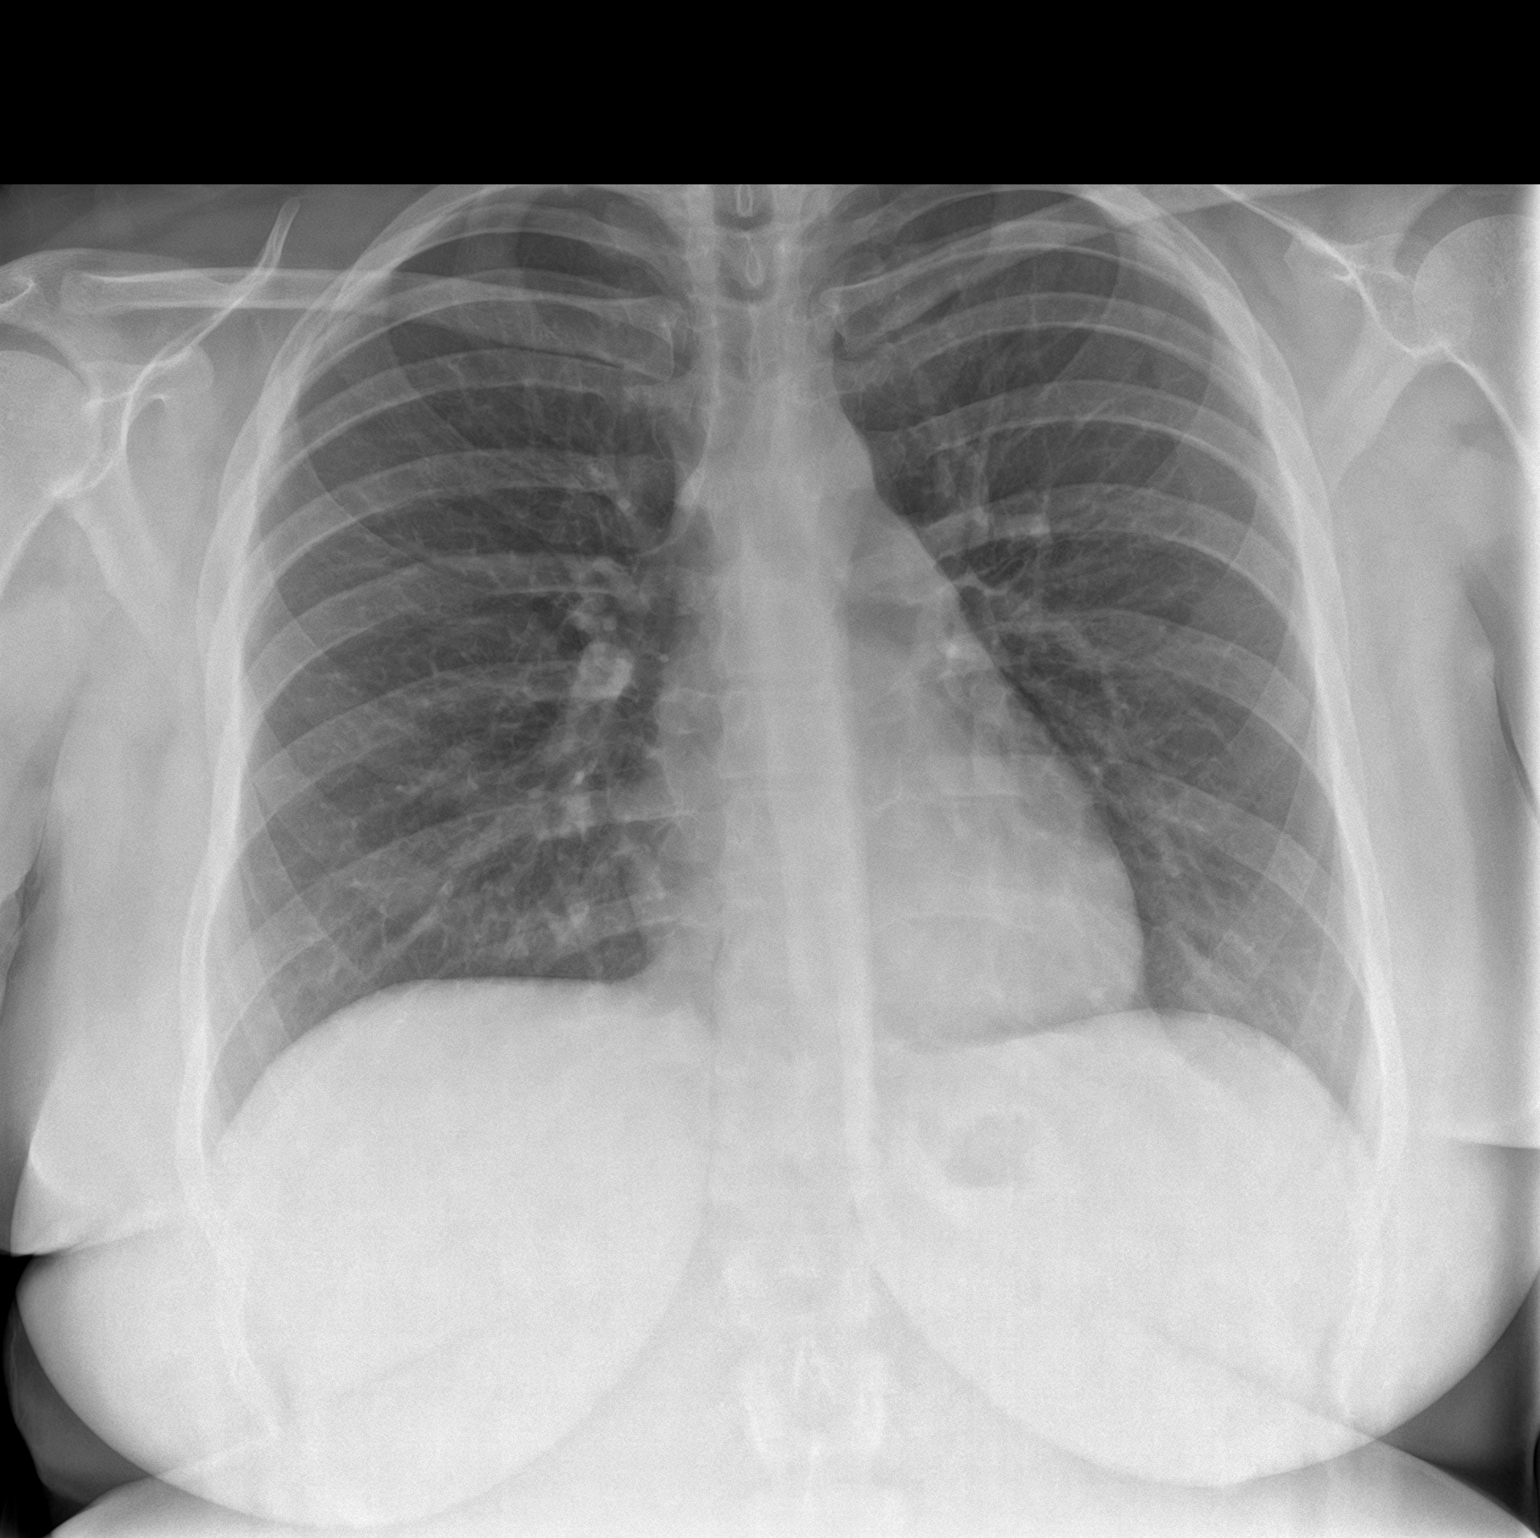

[chest lat]
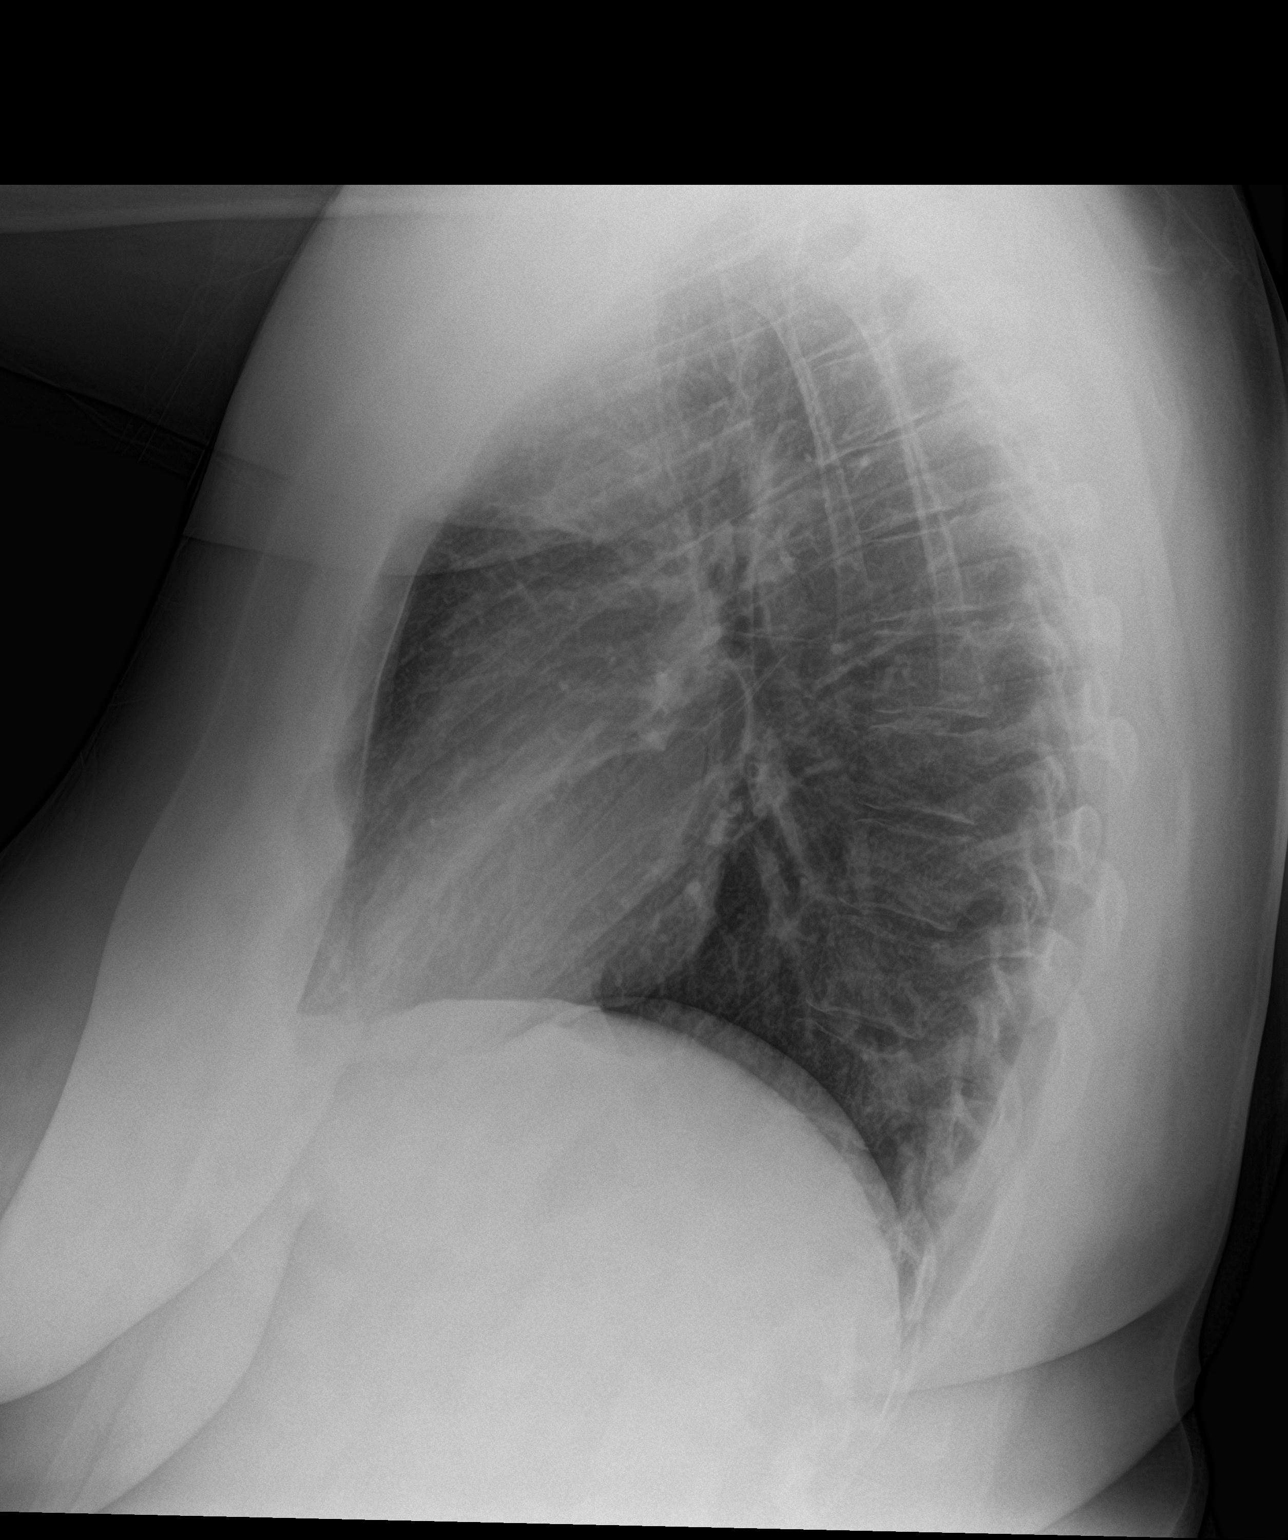

[2 of 2 positions shown; findings below may reference images not displayed]

FINDINGS: Heart size within normal limits. No appreciable airspace
consolidation or pulmonary edema. No evidence of pleural effusion or
pneumothorax. No acute bony abnormality identified.
IMPRESSION: No evidence of active cardiopulmonary disease.

## 2021-04-11 ENCOUNTER — Ambulatory Visit
Admission: RE | Admit: 2021-04-11 | Discharge: 2021-04-11 | Disposition: A | Discharge status: Home / Self Care | Payer: Managed Care, Other (non HMO) | Source: Ambulatory Visit | Attending: Otolaryngology | Admitting: Otolaryngology

## 2021-04-11 DIAGNOSIS — H838X9 Other specified diseases of inner ear, unspecified ear: Secondary | ICD-10-CM

## 2021-05-07 ENCOUNTER — Other Ambulatory Visit: Payer: Self-pay | Admitting: Obstetrics & Gynecology

## 2021-05-07 ENCOUNTER — Encounter: Payer: Self-pay | Admitting: Obstetrics & Gynecology

## 2021-05-07 DIAGNOSIS — R102 Pelvic and perineal pain: Secondary | ICD-10-CM

## 2021-05-19 ENCOUNTER — Encounter (HOSPITAL_COMMUNITY): Payer: Self-pay | Admitting: Emergency Medicine

## 2021-05-19 ENCOUNTER — Other Ambulatory Visit: Payer: Self-pay

## 2021-05-19 ENCOUNTER — Emergency Department (HOSPITAL_COMMUNITY): Payer: Managed Care, Other (non HMO)

## 2021-05-19 ENCOUNTER — Emergency Department (HOSPITAL_COMMUNITY)
Admission: EM | Admit: 2021-05-19 | Discharge: 2021-05-19 | Disposition: A | Discharge status: Home / Self Care | Payer: Managed Care, Other (non HMO) | Attending: Emergency Medicine | Admitting: Emergency Medicine

## 2021-05-19 DIAGNOSIS — R102 Pelvic and perineal pain: Secondary | ICD-10-CM | POA: Diagnosis present

## 2021-05-19 DIAGNOSIS — Z7984 Long term (current) use of oral hypoglycemic drugs: Secondary | ICD-10-CM | POA: Insufficient documentation

## 2021-05-19 DIAGNOSIS — Z79899 Other long term (current) drug therapy: Secondary | ICD-10-CM | POA: Diagnosis not present

## 2021-05-19 LAB — CBC WITH DIFFERENTIAL/PLATELET
Abs Immature Granulocytes: 0.02 10*3/uL (ref 0.00–0.07)
Basophils Absolute: 0 10*3/uL (ref 0.0–0.1)
Basophils Relative: 0 %
Eosinophils Absolute: 0.1 10*3/uL (ref 0.0–0.5)
Eosinophils Relative: 1 %
HCT: 40.1 % (ref 36.0–46.0)
Hemoglobin: 13.4 g/dL (ref 12.0–15.0)
Immature Granulocytes: 0 %
Lymphocytes Relative: 30 %
Lymphs Abs: 2.3 10*3/uL (ref 0.7–4.0)
MCH: 30.1 pg (ref 26.0–34.0)
MCHC: 33.4 g/dL (ref 30.0–36.0)
MCV: 90.1 fL (ref 80.0–100.0)
Monocytes Absolute: 0.5 10*3/uL (ref 0.1–1.0)
Monocytes Relative: 6 %
Neutro Abs: 4.7 10*3/uL (ref 1.7–7.7)
Neutrophils Relative %: 63 %
Platelets: 235 10*3/uL (ref 150–400)
RBC: 4.45 MIL/uL (ref 3.87–5.11)
RDW: 13.5 % (ref 11.5–15.5)
WBC: 7.6 10*3/uL (ref 4.0–10.5)
nRBC: 0 % (ref 0.0–0.2)

## 2021-05-19 LAB — COMPREHENSIVE METABOLIC PANEL
ALT: 31 U/L (ref 0–44)
AST: 22 U/L (ref 15–41)
Albumin: 3.4 g/dL — ABNORMAL LOW (ref 3.5–5.0)
Alkaline Phosphatase: 65 U/L (ref 38–126)
Anion gap: 9 (ref 5–15)
BUN: 10 mg/dL (ref 6–20)
CO2: 24 mmol/L (ref 22–32)
Calcium: 8.8 mg/dL — ABNORMAL LOW (ref 8.9–10.3)
Chloride: 104 mmol/L (ref 98–111)
Creatinine, Ser: 0.8 mg/dL (ref 0.44–1.00)
GFR, Estimated: >60 mL/min (ref >60)
Glucose, Bld: 117 mg/dL — ABNORMAL HIGH (ref 70–99)
Potassium: 3.4 mmol/L — ABNORMAL LOW (ref 3.5–5.1)
Sodium: 137 mmol/L (ref 135–145)
Total Bilirubin: 0.5 mg/dL (ref 0.3–1.2)
Total Protein: 6.2 g/dL — ABNORMAL LOW (ref 6.5–8.1)

## 2021-05-19 LAB — URINALYSIS, ROUTINE W REFLEX MICROSCOPIC
Bilirubin Urine: NEGATIVE
Glucose, UA: NEGATIVE mg/dL
Hgb urine dipstick: NEGATIVE
Ketones, ur: NEGATIVE mg/dL
Leukocytes,Ua: NEGATIVE
Nitrite: NEGATIVE
Protein, ur: NEGATIVE mg/dL
Specific Gravity, Urine: 1.01 (ref 1.005–1.030)
pH: 7 (ref 5.0–8.0)

## 2021-05-19 LAB — WET PREP, GENITAL
Clue Cells Wet Prep HPF POC: NONE SEEN
Sperm: NONE SEEN
Trich, Wet Prep: NONE SEEN
WBC, Wet Prep HPF POC: >=10 — AB (ref <10)
Yeast Wet Prep HPF POC: NONE SEEN

## 2021-05-19 LAB — PREGNANCY, URINE: Preg Test, Ur: NEGATIVE

## 2021-05-19 LAB — LIPASE, BLOOD: Lipase: 35 U/L (ref 11–51)

## 2021-05-19 MED ORDER — IOHEXOL 350 MG/ML SOLN
100.0000 mL | Freq: Once | INTRAVENOUS | Status: AC | PRN
  Administered 2021-05-19: 100 mL via INTRAVENOUS

## 2021-05-19 MED ORDER — ACETAMINOPHEN 325 MG PO TABS
650.0000 mg | ORAL_TABLET | Freq: Once | ORAL | Status: AC
  Administered 2021-05-19: 650 mg via ORAL
  Filled 2021-05-19: qty 2

## 2021-05-19 NOTE — Discharge Instructions (Addendum)
Return to ED with any new or worsening symptoms such as fevers, flank pain, foul odor to urine Please follow-up with your gynecologist as we discussed

## 2021-05-19 NOTE — ED Triage Notes (Signed)
Pt reports lower abd/pelvic pain that radiates to back since yesterday.  Also reports mild nausea and painful urination.

## 2021-05-19 NOTE — ED Provider Notes (Signed)
[facility] EMERGENCY DEPARTMENT Provider Note   CSN: [id number] Arrival date & time: 05/19/21  1033     History  Chief Complaint  Patient presents with   Pelvic Pain    Isabel Higgins is a 26 y.o. female G2, P2 with history of uterine prolapse, GERD, psoriasis, menometrorrhagia.  Patient presents to ED due to abdominal/pelvic pain since last night.  Patient states that she has a history of uterine prolapse, is currently being seen by her gynecologist.  Patient reports that she has had a right-sided abdominal/pelvic pain and cramping for "months".  Patient reports that 1 month ago she began experiencing increased nausea and vomiting.  Patient states that this time she had her work conduct a blood pregnancy test which was negative.  Last night patient began experiencing bilateral "fullness" and pressure in her vagina that she states is similar to the discomfort she experienced on the right side due to her uterine prolapse.  This caused her to present to ED for evaluation.  Patient reports she has IUD, she has inconsistent menstrual period due to her IUD and patient did experience vaginal spotting last week but states this is a normal occurrence for her.  Patient endorsing pelvic/abdominal pain and cramping, nausea.  Patient denies vomiting, diarrhea, fevers, vaginal discharge, vaginal bleeding, new sexual contacts.   Pelvic Pain Associated symptoms include abdominal pain.      Home Medications Prior to Admission medications   Medication Sig Start Date End Date Taking? Authorizing Provider  HUMIRA PEN 40 MG/0.4ML PNKT SMARTSIG:40 Milligram(s) SUB-Q Every 2 Weeks 11/06/20   [provider]  levonorgestrel (MIRENA) 20 MCG/24HR IUD 1 each by Intrauterine route once.    [provider]  metFORMIN (GLUCOPHAGE) 500 MG tablet Take by mouth. 01/11/21   [provider]  sertraline (ZOLOFT) 50 MG tablet Take 1.5 tablets (75 mg total) by mouth daily.  11/22/19 11/21/20  Nadara Mustard, MD  venlafaxine (EFFEXOR) 75 MG tablet TAKE 1 TABLET BY MOUTH ONCE DAILY FOR HEADACHES 09/18/20   [provider]      Allergies    Honeysuckle flower [lonicera] and Amoxicillin    Review of Systems   Review of Systems  Constitutional:  Negative for chills and fever.  Gastrointestinal:  Positive for abdominal pain and nausea. Negative for diarrhea and vomiting.  Genitourinary:  Positive for pelvic pain and vaginal bleeding. Negative for flank pain and vaginal discharge.  All other systems reviewed and are negative.  Physical Exam Updated Vital Signs BP 109/74    Pulse 87    Temp 97.8 F (36.6 C) (Oral)    Resp 18    SpO2 98%  Physical Exam Vitals and nursing note reviewed.  Constitutional:      Appearance: Normal appearance.  HENT:     Head: Normocephalic and atraumatic.     Nose: Nose normal.     Mouth/Throat:     Mouth: Mucous membranes are moist.  Eyes:     Extraocular Movements: Extraocular movements intact.     Conjunctiva/sclera: Conjunctivae normal.     Pupils: Pupils are equal, round, and reactive to light.  Cardiovascular:     Rate and Rhythm: Normal rate and regular rhythm.  Pulmonary:     Effort: Pulmonary effort is normal.     Breath sounds: Normal breath sounds. No wheezing.  Abdominal:     General: Abdomen is flat. Bowel sounds are normal.     Palpations: Abdomen is soft.  Tenderness: There is no abdominal tenderness. There is no right CVA tenderness or left CVA tenderness.  Musculoskeletal:        General: Normal range of motion.     Cervical back: Normal range of motion and neck supple.  Skin:    General: Skin is warm and dry.     Capillary Refill: Capillary refill takes less than 2 seconds.  Neurological:     Mental Status: She is alert and oriented to person, place, and time.    ED Results / Procedures / Treatments   Labs (all labs ordered are listed, but only abnormal results are displayed) Labs  Reviewed  WET PREP, GENITAL - Abnormal; Notable for the following components:      Result Value   WBC, Wet Prep HPF POC >=10 (*)    All other components within normal limits  COMPREHENSIVE METABOLIC PANEL - Abnormal; Notable for the following components:   Potassium 3.4 (*)    Glucose, Bld 117 (*)    Calcium 8.8 (*)    Total Protein 6.2 (*)    Albumin 3.4 (*)    All other components within normal limits  CBC WITH DIFFERENTIAL/PLATELET  LIPASE, BLOOD  URINALYSIS, ROUTINE W REFLEX MICROSCOPIC  PREGNANCY, URINE  GC/CHLAMYDIA PROBE AMP () NOT AT [facility]    EKG None  Radiology CT ABDOMEN PELVIS W CONTRAST  Result Date: 05/19/2021 CLINICAL DATA:  Abdominal pain, acute, nonlocalized lower abd EXAM: CT ABDOMEN AND PELVIS WITH CONTRAST TECHNIQUE: Multidetector CT imaging of the abdomen and pelvis was performed using the standard protocol following bolus administration of intravenous contrast. RADIATION DOSE REDUCTION: This exam was performed according to the departmental dose-optimization program which includes automated exposure control, adjustment of the mA and/or kV according to patient size and/or use of iterative reconstruction technique. CONTRAST:  OMNIPAQUE IOHEXOL 350 MG/ML SOLN COMPARISON:  2016 FINDINGS: Lower chest: No acute abnormality. Hepatobiliary: No focal liver abnormality is seen. No gallstones, gallbladder wall thickening, or biliary dilatation. Pancreas: Unremarkable. No pancreatic ductal dilatation or surrounding inflammatory changes. Spleen: Normal in size without focal abnormality. Adrenals/Urinary Tract: Adrenals are unremarkable. Kidneys are unremarkable. Partially distended bladder is unremarkable. Stomach/Bowel: Stomach is within limits. Bowel is normal in caliber. Normal appendix. Vascular/Lymphatic: No significant vascular abnormality. No enlarged nodes. Reproductive: Intrauterine device is present.  No adnexal mass. Other: No free fluid.  Abdominal wall is  unremarkable. Musculoskeletal: No acute osseous abnormality. IMPRESSION: No acute abnormality. Electronically Signed   By: Guadlupe Spanish M.D.   On: 05/19/2021 14:08    Procedures Pelvic exam  Date/Time: 05/19/2021 12:15 PM Performed by: Al Decant, PA-C Authorized by: Al Decant, PA-C  Consent: Verbal consent obtained. Written consent obtained. Risks and benefits: risks, benefits and alternatives were discussed Consent given by: patient Patient understanding: patient states understanding of the procedure being performed Patient consent: the patient's understanding of the procedure matches consent given Procedure consent: procedure consent matches procedure scheduled Patient identity confirmed: verbally with patient and arm band Time out: Immediately prior to procedure a "time out" was called to verify the correct patient, procedure, equipment, support staff and site/side marked as required. Preparation: Patient was prepped and draped in the usual sterile fashion. Local anesthesia used: no  Anesthesia: Local anesthesia used: no  Sedation: Patient sedated: no  Patient tolerance: patient tolerated the procedure well with no immediate complications Comments: Scant milky white vaginal discharge noted.  No adnexal tenderness, no cervical motion tenderness.  Obvious prolapse noted.  Medications Ordered in ED Medications  acetaminophen (TYLENOL) tablet 650 mg (650 mg Oral Given 05/19/21 1258)  iohexol (OMNIPAQUE) 350 MG/ML injection 100 mL (100 mLs Intravenous Contrast Given 05/19/21 1355)    ED Course/ Medical Decision Making/ A&P                           Medical Decision Making Amount and/or Complexity of Data Reviewed Labs: ordered.   26 year old female presents to ED for evaluation of pelvic pain.  Please see HPI for further details. On examination, the patient is afebrile, nontachycardic, not hypoxic, clear lung sounds, soft compressible abdomen.  The  patient is nontoxic in appearance. Speculum examination did not reveal any obvious abnormalities the patient vagina.  There was a scant amount of white mucosal discharge located in the vaginal vault, this is been swabbed and sent off for evaluation.  Patient states that she has a history of uterine prolapse, diagnosed by gynecology.  Patient states that she had two live births with children 9 pounds at birth as well as 10 pounds at birth separately.  Patient states gynecologist has explained that this is the cause of her uterine prolapse.  Patient worked up utilizing following labs and imaging studies interpreted by me: -CT abdomen pelvis with contrast does not show any abnormalities - CBC unremarkable - CMP unremarkable - Urinalysis unremarkable - Lipase 35 - Wet prep shows no signs of the cells, clue cells, signs of trichomonas - Pregnancy test negative  At this time, because of patient pelvic fullness most likely due to worsening of uterine prolapse.  The patient has been advised to follow-up with her gynecologist this week as we are unable to identify any cause at this time based on her imaging and work-up.  The patient has been provided with return precautions and she voiced understanding.  The patient has had all of her questions answered to her satisfaction.  The patient is stable at time of discharge.   Final Clinical Impression(s) / ED Diagnoses Final diagnoses:  Pelvic pain in female    Rx / DC Orders ED Discharge Orders     None         Clent Ridges 05/19/21 1426    Derwood Kaplan, MD 05/20/21 [phone]

## 2021-05-21 LAB — GC/CHLAMYDIA PROBE AMP (~~LOC~~) NOT AT ARMC
Chlamydia: NEGATIVE
Comment: NEGATIVE
Comment: NORMAL
Neisseria Gonorrhea: NEGATIVE

## 2021-05-24 ENCOUNTER — Ambulatory Visit (INDEPENDENT_AMBULATORY_CARE_PROVIDER_SITE_OTHER): Payer: Managed Care, Other (non HMO)

## 2021-05-24 ENCOUNTER — Other Ambulatory Visit: Payer: Self-pay | Admitting: Obstetrics & Gynecology

## 2021-05-24 ENCOUNTER — Other Ambulatory Visit: Payer: Self-pay

## 2021-05-24 DIAGNOSIS — R102 Pelvic and perineal pain: Secondary | ICD-10-CM

## 2021-05-28 ENCOUNTER — Telehealth: Payer: Self-pay

## 2021-05-28 NOTE — Telephone Encounter (Signed)
Pt calling for results from u/s done last Thursday.  [phone] ?

## 2021-05-29 NOTE — Telephone Encounter (Signed)
Pt aware.

## 2021-05-29 NOTE — Telephone Encounter (Signed)
Let her know [place] normal as far as IUD and ovaries/uterus is concerned (just a small resolving left ovarian cyst).  Can follow up if continues w pain.

## 2021-06-03 ENCOUNTER — Encounter: Payer: Self-pay | Admitting: Obstetrics & Gynecology

## 2021-06-04 ENCOUNTER — Other Ambulatory Visit: Payer: Self-pay

## 2021-06-04 ENCOUNTER — Other Ambulatory Visit (HOSPITAL_COMMUNITY): Payer: Self-pay | Admitting: Family Medicine

## 2021-06-04 ENCOUNTER — Other Ambulatory Visit: Payer: Self-pay | Admitting: Family Medicine

## 2021-06-04 ENCOUNTER — Ambulatory Visit
Admission: RE | Admit: 2021-06-04 | Discharge: 2021-06-04 | Disposition: A | Discharge status: Home / Self Care | Payer: Managed Care, Other (non HMO) | Source: Ambulatory Visit | Attending: Family Medicine | Admitting: Family Medicine

## 2021-06-04 DIAGNOSIS — R1031 Right lower quadrant pain: Secondary | ICD-10-CM

## 2021-06-04 DIAGNOSIS — N939 Abnormal uterine and vaginal bleeding, unspecified: Secondary | ICD-10-CM | POA: Insufficient documentation

## 2021-06-04 DIAGNOSIS — M545 Low back pain, unspecified: Secondary | ICD-10-CM | POA: Insufficient documentation

## 2021-06-04 DIAGNOSIS — R1032 Left lower quadrant pain: Secondary | ICD-10-CM

## 2021-07-09 ENCOUNTER — Encounter: Payer: Self-pay | Admitting: Emergency Medicine

## 2021-07-09 ENCOUNTER — Emergency Department
Admission: EM | Admit: 2021-07-09 | Discharge: 2021-07-09 | Disposition: A | Discharge status: Home / Self Care | Payer: Managed Care, Other (non HMO) | Attending: Emergency Medicine | Admitting: Emergency Medicine

## 2021-07-09 ENCOUNTER — Emergency Department: Payer: Managed Care, Other (non HMO)

## 2021-07-09 ENCOUNTER — Other Ambulatory Visit: Payer: Self-pay

## 2021-07-09 DIAGNOSIS — K5900 Constipation, unspecified: Secondary | ICD-10-CM | POA: Diagnosis not present

## 2021-07-09 DIAGNOSIS — R109 Unspecified abdominal pain: Secondary | ICD-10-CM | POA: Diagnosis present

## 2021-07-09 LAB — POC URINE PREG, ED: Preg Test, Ur: NEGATIVE

## 2021-07-09 NOTE — ED Triage Notes (Signed)
Pt via POV from home. Pt c/o constipation for the past weeks. States the pain is 7/10. Pt has been trying OTC medication with no relief. Pt is A&OX4 and NAD ?

## 2021-07-09 NOTE — Discharge Instructions (Signed)
Follow-up with your primary care provider if any continued problems.  There is no pattern that suggest an obstruction on your x-ray.  Obtain MiraLAX over-the-counter (generic is fine).  Mix 1 capful of MiraLAX with 1 glass of Gatorade each hour followed by fluids until able to have a bowel movement.  You may also obtain generic Colace which is a stool softener and if there is a stool in the rectal area that is hard obtain glycerin suppositories which will soften this and make it easier for you to go. ?

## 2021-07-09 NOTE — ED Provider Notes (Signed)
? ?[facility] ?Provider Note ? ? ? Event Date/Time  ? First MD Initiated Contact with Patient 07/09/21 1258   ?  (approximate) ? ? ?History  ? ?Constipation ? ? ?HPI ? ?Isabel Higgins is a 26 y.o. female   presents to the ED with complaint of constipation for 1 week.  Patient states that she took a laxative for the last 2 days without any relief and today her abdominal pain is worse.  Patient denies any previous history of constipation.  She was able to have a very small bowel movement today which did not relieve her pain. ? ? ?Physical Exam  ? ?Triage Vital Signs: ?ED Triage Vitals  ?Enc Vitals Group  ?   BP 07/09/21 1244 107/77  ?   Pulse Rate 07/09/21 1244 82  ?   Resp 07/09/21 1244 18  ?   Temp 07/09/21 1244 98.7 ?F (37.1 ?C)  ?   Temp Source 07/09/21 1244 Oral  ?   SpO2 07/09/21 1244 96 %  ?   Weight 07/09/21 1227 246 lb (111.6 kg)  ?   Height 07/09/21 1227 5\' 7"  (1.702 m)  ?   Head Circumference --   ?   Peak Higgins --   ?   Pain Score 07/09/21 1227 7  ?   Pain Loc --   ?   Pain Edu? --   ?   Excl. in GC? --   ? ? ?Most recent vital signs: ?Vitals:  ? 07/09/21 1244  ?BP: 107/77  ?Pulse: 82  ?Resp: 18  ?Temp: 98.7 ?F (37.1 ?C)  ?SpO2: 96%  ? ? ? ?General: Awake, no distress.  ?CV:  Good peripheral perfusion.  ?Resp:  Normal effort.  ?Abd:  No distention.  Soft, nontender, bowel sounds are hypoactive at present. ?Other:   ? ? ?ED Results / Procedures / Treatments  ? ?Labs ?(all labs ordered are listed, but only abnormal results are displayed) ?Labs Reviewed  ?POC URINE PREG, ED  ? ? ?RADIOLOGY ?1 view abdomen images were reviewed by me independently of the radiologist and no large stool burden was noted and no ileus.  Radiology report is negative for obstruction stool burden is within normal limits. ? ? ? ?PROCEDURES: ? ?Critical Care performed:  ? ?Procedures ? ? ?MEDICATIONS ORDERED IN ED: ?Medications - No data to display ? ? ?IMPRESSION / MDM / ASSESSMENT AND PLAN / ED COURSE  ?I  reviewed the triage vital signs and the nursing notes. ? ? ?Differential diagnosis includes, but is not limited to, constipation, obstruction, ileus. ? ?26 year old female presents to the ED with complaint of constipation with out a normal bowel movement for 1 week.  Patient states that she did have a small bowel movement today but now has discomfort.  She took her mother-in-law's laxative last evening and does not know the name of this.  The mother-in-law currently is going through chemotherapy and was prescribed for her.  We discussed using MiraLAX and she is aware that the instructions are being given as if she was having a colonoscopy.  She is also encouraged to buy the generic version of MiraLAX to save money.  Mix this with Gatorade and drink 1 capful along with water every hour until she is able to have a bowel movement.  I suggested glycerin suppositories and also Colace over-the-counter. ? ? ? ?  ? ? ?FINAL CLINICAL IMPRESSION(S) / ED DIAGNOSES  ? ?Final diagnoses:  ?Constipation, unspecified constipation type  ? ? ? ?  Rx / DC Orders  ? ?ED Discharge Orders   ? ? None  ? ?  ? ? ? ?Note:  This document was prepared using Dragon voice recognition software and may include unintentional dictation errors. ?  ?Tommi Rumps, PA-C ?07/09/21 1519 ? ?  ?Jene Every, MD ?07/09/21 1554 ? ?

## 2021-07-09 NOTE — ED Notes (Signed)
See triage note  presents with constipation for about 1 week  was able to pass a small stool today  also having some upper abd pain with some n/v   ?

## 2021-07-17 ENCOUNTER — Other Ambulatory Visit
Admission: RE | Admit: 2021-07-17 | Discharge: 2021-07-17 | Disposition: A | Discharge status: Home / Self Care | Payer: Managed Care, Other (non HMO) | Source: Ambulatory Visit | Attending: Gastroenterology | Admitting: Gastroenterology

## 2021-07-17 DIAGNOSIS — A09 Infectious gastroenteritis and colitis, unspecified: Secondary | ICD-10-CM | POA: Diagnosis not present

## 2021-07-17 LAB — C DIFFICILE QUICK SCREEN W PCR REFLEX
C Diff antigen: NEGATIVE
C Diff interpretation: NOT DETECTED
C Diff toxin: NEGATIVE

## 2021-07-30 ENCOUNTER — Ambulatory Visit: Payer: Managed Care, Other (non HMO) | Admitting: Physical Therapy

## 2021-08-06 ENCOUNTER — Encounter: Payer: Managed Care, Other (non HMO) | Admitting: Physical Therapy

## 2021-08-13 ENCOUNTER — Encounter: Payer: Managed Care, Other (non HMO) | Admitting: Physical Therapy

## 2021-08-21 ENCOUNTER — Encounter: Payer: Managed Care, Other (non HMO) | Admitting: Physical Therapy

## 2021-08-27 ENCOUNTER — Encounter: Payer: Managed Care, Other (non HMO) | Admitting: Physical Therapy

## 2021-11-07 ENCOUNTER — Other Ambulatory Visit: Payer: Self-pay | Admitting: Family Medicine

## 2021-11-07 ENCOUNTER — Ambulatory Visit
Admission: RE | Admit: 2021-11-07 | Discharge: 2021-11-07 | Disposition: A | Discharge status: Home / Self Care | Payer: Managed Care, Other (non HMO) | Source: Ambulatory Visit | Attending: Family Medicine | Admitting: Family Medicine

## 2021-11-07 DIAGNOSIS — N6489 Other specified disorders of breast: Secondary | ICD-10-CM

## 2021-11-14 IMAGING — MR MR BRAIN/IAC WO/W CM
10 of 14 series · 27 of 48 positions shown · IV contrast (gadavist)
Comparison: None.

CLINICAL DATA: For sensorineural hearing loss, unilateral, right
ear, with unrestricted hearing on the contralateral side.

EXAM:
MRI HEAD WITHOUT AND WITH CONTRAST
TECHNIQUE: Multiplanar, multiecho pulse sequences of the brain and surrounding
structures were obtained without and with intravenous contrast.
CONTRAST:  10mL GADAVIST GADOBUTROL 1 MMOL/ML IV SOLN

[Series 5: T1 · sagittal · 5.0mm · 0.62mm/px · 3 of 23 slices shown (1 of 3)]
[im 1/23]
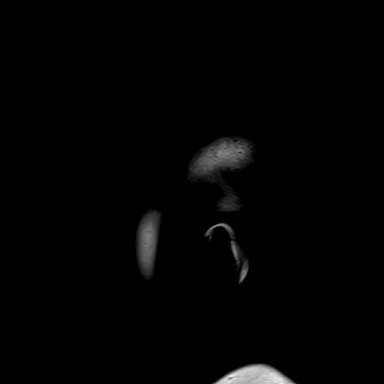
[im 12/23]
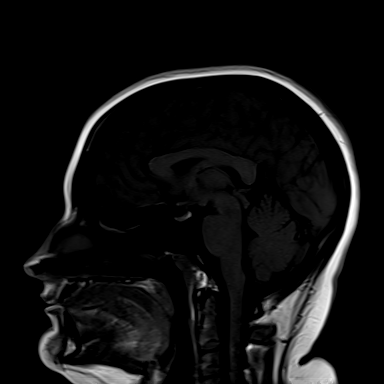
[im 23/23]
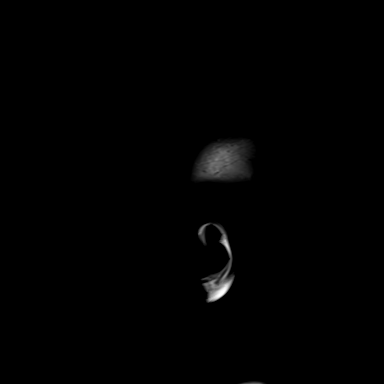

[Series 6: ax dwi_tracew · axial · 3.0mm · 0.60mm/px · z∈[-111,+44]mm · 4 of 48 slices shown]
[im 1/48]
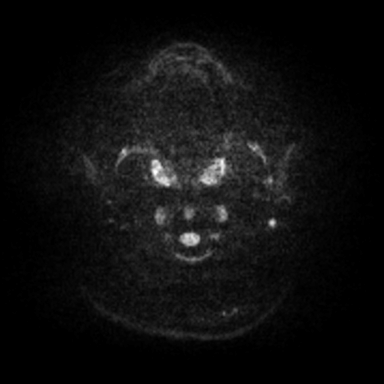
[im 16/48]
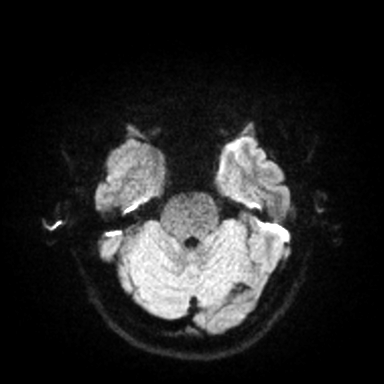
[im 32/48]
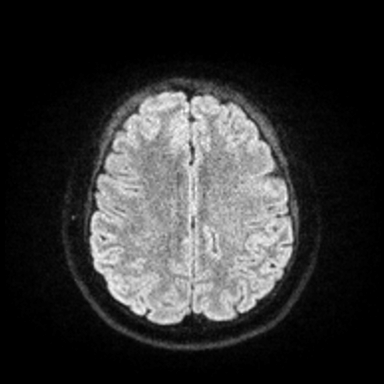
[im 48/48]
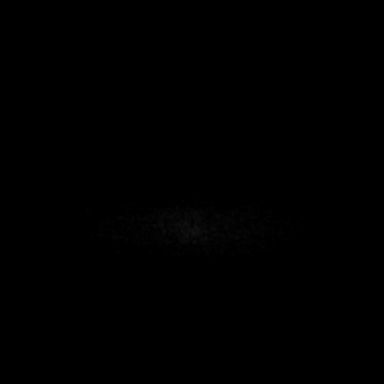

[Series 7: ax dwi_adc · axial · 3.0mm · 0.60mm/px · 1 of 47 slices shown]
[im 1/47]
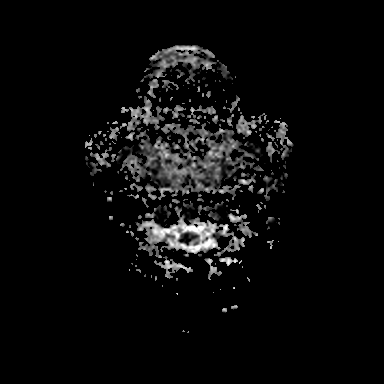

[Series 8: T2 · axial · 5.0mm · 0.53mm/px · z∈[-113,+30]mm · 2 of 25 slices shown]
[im 1/25]
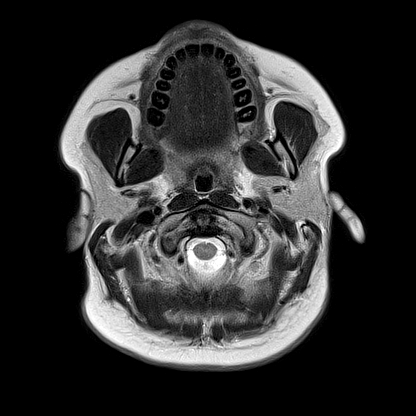
[im 25/25]
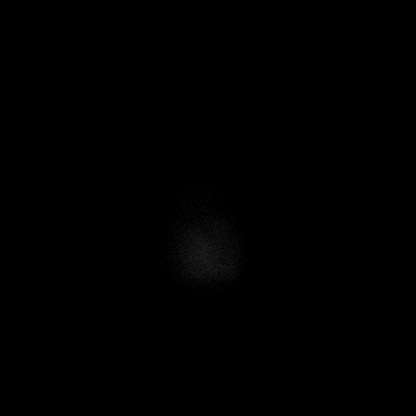

[Series 13: T1 · coronal · non-contrast · 3.0mm · 0.21mm/px · 1 of 13 slices shown (2 of 3)]
[im 1/13]
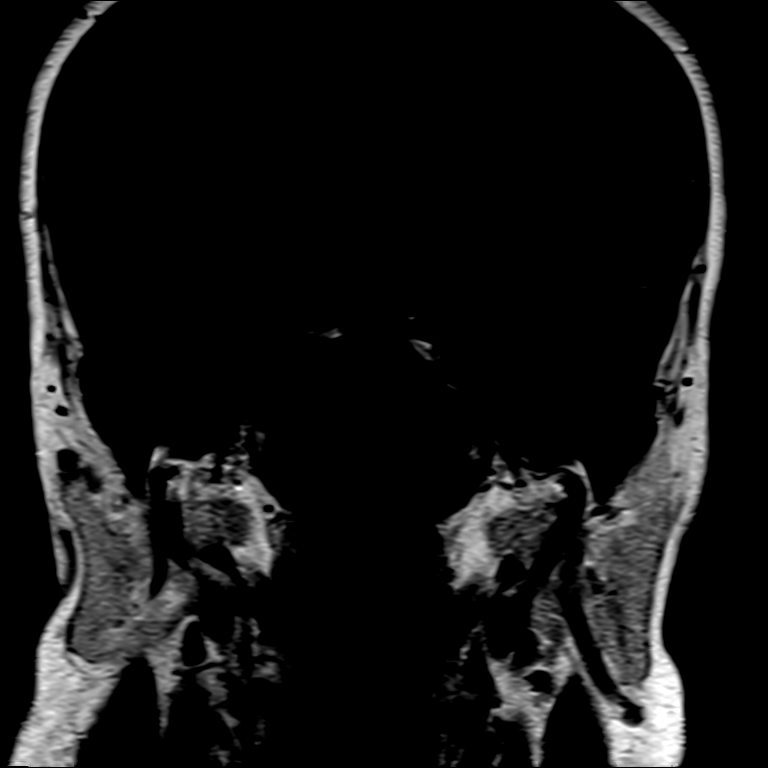

[Series 14: FLAIR · axial · 3.0mm · 0.53mm/px · z∈[-122,+39]mm · 4 of 55 slices shown]
[im 1/55]
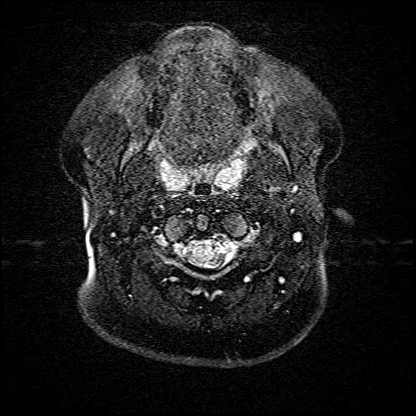
[im 19/55]
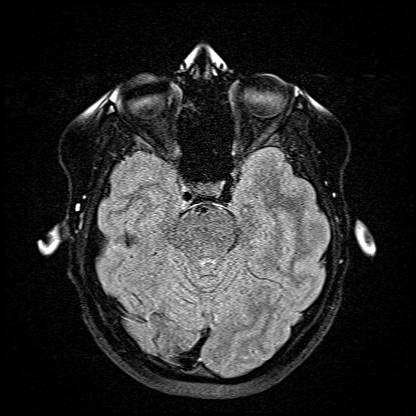
[im 37/55]
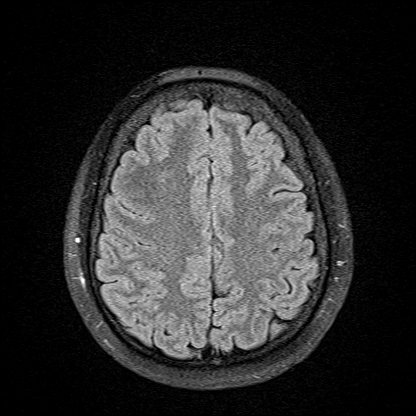
[im 55/55]
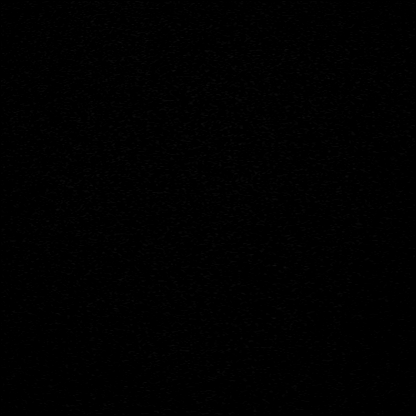

[Series 16: T1 · axial · non-contrast · 3.0mm · 0.21mm/px · 1 of 15 slices shown (3 of 3)]
[im 1/15]
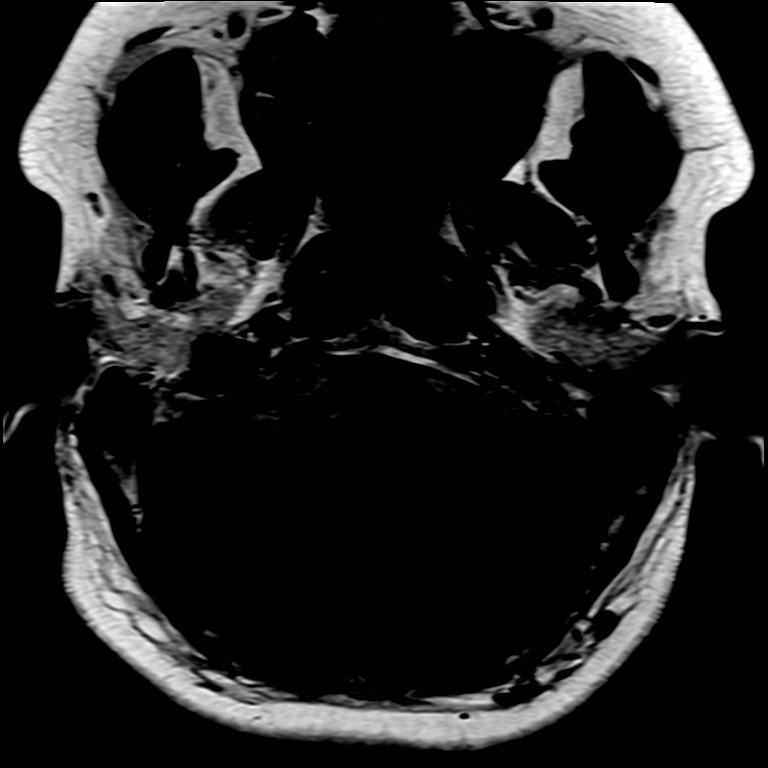

[Series 17: T1 post-contrast · axial · 3.0mm · 0.21mm/px · 1 of 15 slices shown (1 of 3)]
[im 1/15]
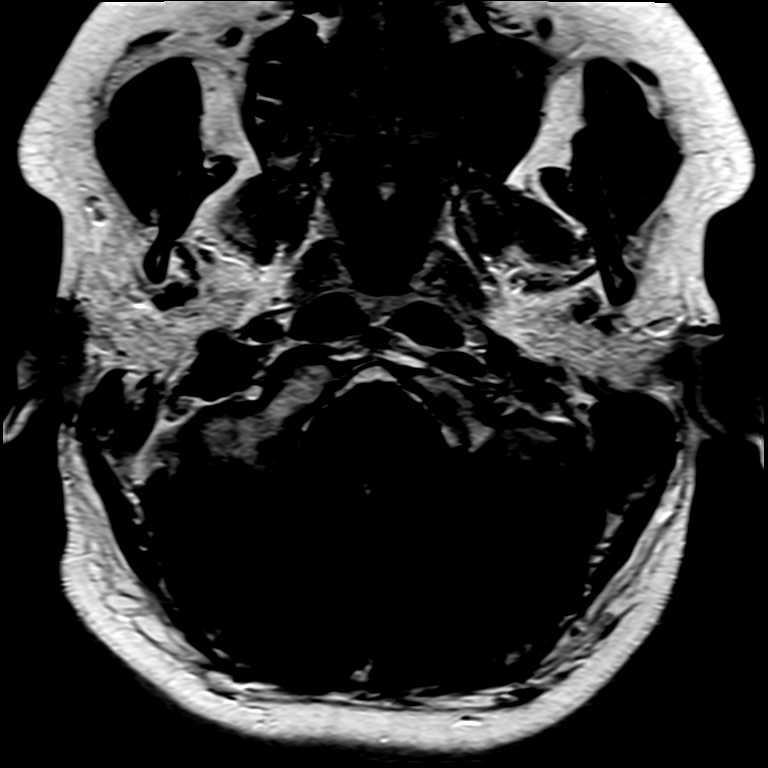

[Series 18: T1 post-contrast · coronal · 3.0mm · 0.21mm/px · 1 of 13 slices shown (2 of 3)]
[im 1/13]
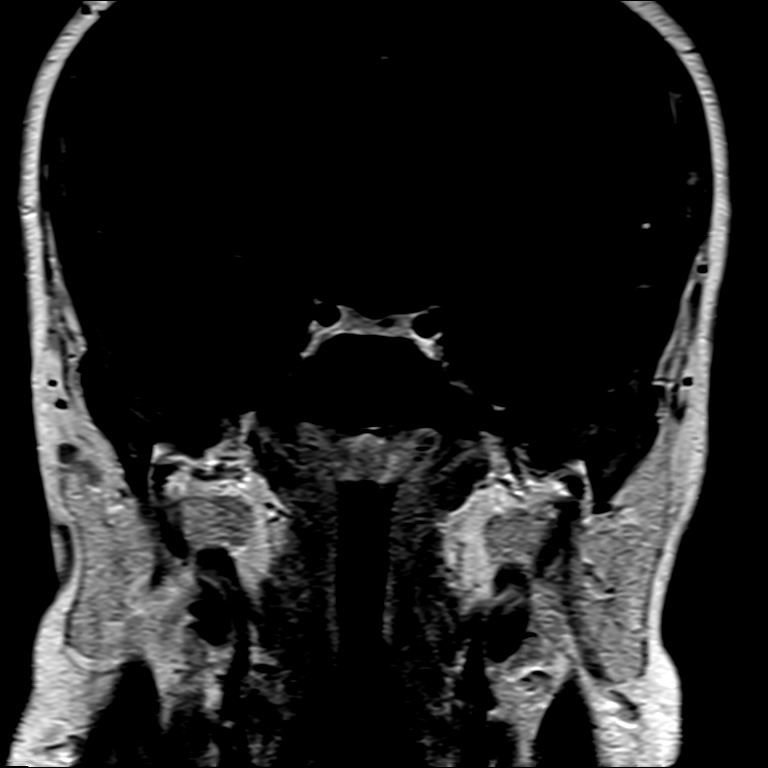

[Series 19: T1 post-contrast · axial · 1.0mm · 0.98mm/px · z∈[-130,+43]mm · 9 of 173 slices shown (3 of 3)]
[im 1/173]
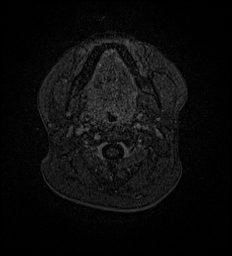
[im 32/173]
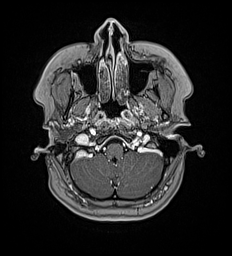
[im 47/173]
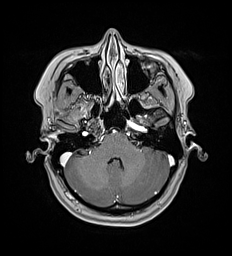
[im 79/173]
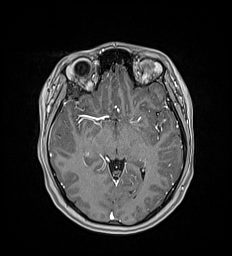
[im 94/173]
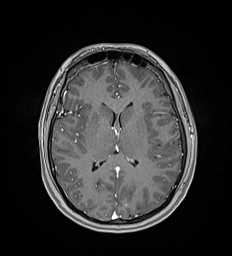
[im 126/173]
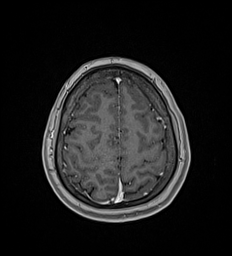
[im 141/173]
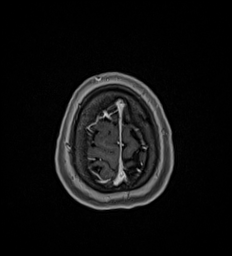
[im 157/173]
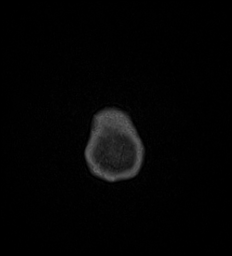
[im 173/173]
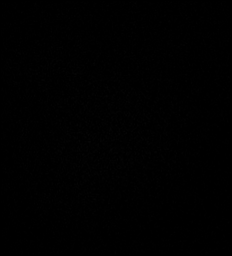

[27 of 48 positions shown; findings below may reference images not displayed]

FINDINGS: Brain: No acute infarction, hemorrhage, hydrocephalus, extra-axial
collection or mass lesion.

No cerebellopontine angle mass or internal auditory canal lesion is
demonstrated.Normal appearance of the 7th and 8th cranial nerves
bilaterally. No focus of abnormal contrast enhancement.

Postsurgical changes in the right mastoid with T2 hyperintensity.
Further evaluation with CT of the temporal bones is suggested if
clinically indicated.

Vascular: Normal flow voids.

Skull and upper cervical spine: Normal marrow signal.

Sinuses/Orbits: Negative.
IMPRESSION: 1. No acute intracranial abnormality or abnormal contrast
enhancement.
2. No cerebellopontine angle mass or internal auditory canal lesion.
Normal appearance of the 7th and 8th cranial nerves bilaterally.
3. Postsurgical changes in the right mastoid with T2 hyperintensity.
Further evaluation with CT of the temporal bones is suggested if
clinically indicated.

## 2022-01-08 DIAGNOSIS — O0993 Supervision of high risk pregnancy, unspecified, third trimester: Secondary | ICD-10-CM | POA: Insufficient documentation

## 2022-01-09 LAB — OB RESULTS CONSOLE HIV ANTIBODY (ROUTINE TESTING): HIV: NONREACTIVE

## 2022-01-09 LAB — OB RESULTS CONSOLE GC/CHLAMYDIA
Chlamydia: NEGATIVE
Neisseria Gonorrhea: NEGATIVE

## 2022-01-23 ENCOUNTER — Other Ambulatory Visit: Payer: Self-pay | Admitting: Certified Nurse Midwife

## 2022-01-23 DIAGNOSIS — Z363 Encounter for antenatal screening for malformations: Secondary | ICD-10-CM

## 2022-02-08 DIAGNOSIS — O30043 Twin pregnancy, dichorionic/diamniotic, third trimester: Secondary | ICD-10-CM | POA: Insufficient documentation

## 2022-02-13 ENCOUNTER — Encounter: Payer: Self-pay | Admitting: *Deleted

## 2022-02-13 ENCOUNTER — Ambulatory Visit: Payer: Managed Care, Other (non HMO) | Attending: Certified Nurse Midwife

## 2022-02-13 ENCOUNTER — Other Ambulatory Visit: Payer: Self-pay | Admitting: *Deleted

## 2022-02-13 ENCOUNTER — Ambulatory Visit: Payer: Managed Care, Other (non HMO) | Admitting: *Deleted

## 2022-02-13 ENCOUNTER — Ambulatory Visit: Payer: Managed Care, Other (non HMO)

## 2022-02-13 ENCOUNTER — Ambulatory Visit (HOSPITAL_BASED_OUTPATIENT_CLINIC_OR_DEPARTMENT_OTHER): Payer: Managed Care, Other (non HMO) | Admitting: Obstetrics and Gynecology

## 2022-02-13 VITALS — BP 128/68 | HR 88

## 2022-02-13 DIAGNOSIS — Z3A13 13 weeks gestation of pregnancy: Secondary | ICD-10-CM | POA: Diagnosis not present

## 2022-02-13 DIAGNOSIS — Z363 Encounter for antenatal screening for malformations: Secondary | ICD-10-CM | POA: Insufficient documentation

## 2022-02-13 DIAGNOSIS — O24415 Gestational diabetes mellitus in pregnancy, controlled by oral hypoglycemic drugs: Secondary | ICD-10-CM

## 2022-02-13 DIAGNOSIS — Z3481 Encounter for supervision of other normal pregnancy, first trimester: Secondary | ICD-10-CM

## 2022-02-13 DIAGNOSIS — E668 Other obesity: Secondary | ICD-10-CM

## 2022-02-13 DIAGNOSIS — O99211 Obesity complicating pregnancy, first trimester: Secondary | ICD-10-CM | POA: Diagnosis not present

## 2022-02-13 DIAGNOSIS — Z3689 Encounter for other specified antenatal screening: Secondary | ICD-10-CM

## 2022-02-13 DIAGNOSIS — O30041 Twin pregnancy, dichorionic/diamniotic, first trimester: Secondary | ICD-10-CM

## 2022-02-13 DIAGNOSIS — O99321 Drug use complicating pregnancy, first trimester: Secondary | ICD-10-CM

## 2022-02-13 NOTE — Progress Notes (Signed)
Maternal-Fetal Medicine   Name: Isabel Higgins DOB: 08-23-1995 MRN: [id number] Referring Provider: Donato Schultz, CNM  I had the pleasure of seeing [facility] at [facility]. She is G3 P2002 at 12-weeks' gestation with twin pregnancy. She is here for first-trimester ultrasound and consultation.  This is a natural conception. Patient has a new diagnosis of gestational diabetes. Recent hemoglobin A1C was 5.7%.  She had abnormal 1-hour GCT followed by a normal 3-hour GTT.  Patient has been checking her blood glucose regularly and the fasting levels are increased.  She takes metformin 1000 mg in the morning and 500 mg at night.  Diabetes still not well-controlled. Her pregnancy is dated by LMP date consistent with 8-week ultrasound.    Obstetrical history -04/2016: Term (37 weeks) vaginal delivery of a female infant weighing 9 pounds and 13 ounces at birth.  Her pregnancy was complicated by gestational hypertension and she had induction of labor.  No history of gestational diabetes. -09/2019: Term vaginal delivery of a female infant weighing 9 pounds and 5 ounces at birth.  She did not have hypertension in that pregnancy.  No history of gestational diabetes. Both her children are in good health.  Gyn history: History of irregular cycles.  But patient reports that she had irregular periods and had IUD removed. She had one menstrual cycle after IUD removal.  No history of breast biopsies.  History of endometriosis. Past medical history: No history of attention or any other chronic medical conditions.  She has class III obesity. Past surgical history: Left and right shoulder surgeries, right ear surgery, laparoscopic. Medications: Metformin, low-dose aspirin, Effexor (venlafaxine), prenatal vitamins. Allergies: No known drug allergies. Social history: Denies tobacco or drug or alcohol use.  She has been married 6 years ago husband is in good health.  He is Caucasian. Family  history: Grandfather had coronary heart disease and DVT.  Prenatal course: Patient has not had screening for fetal aneuploidies.  She had ultrasound performed at [redacted] weeks gestation.  Ultrasound We confirmed a dichorionic-diamniotic twin pregnancy. [name]: Lower fetus.  The CRL measurement is consistent with the previously established dates by ultrasound.  Good fetal heart activity is seen.  Fetal anatomical survey was difficult to evaluate because of fetal position and maternal body habitus.  No evidence of cystic hygroma.  Twin B: Upper fetus. The CRL measurement is consistent with the previously established dates by ultrasound.  Good fetal heart activity is seen.  Fetal anatomical survey was difficult to evaluate because of fetal position and maternal body habitus.  No evidence of cystic hygroma.  Dichorionic-diamniotic twin pregnancy I explained the significance of chorionicity and its implications. Possible complications associated with twin pregnancy that include preterm labor/delivery (most common), fetal growth restriction of one or both twins, miscarriage, malpresentations and increased cesarean delivery rate, postpartum hemorrhage. I informed her that maternal complications including gestational diabetes and gestational hypertension/preeclampsia are more common. It is reassuring to note that she did not have preterm deliveries.  I discussed the mode of delivery that is based on the presentations.  If both have Vx/Vx or Vx/non-vertex presentations, vaginal delivery may be considered. In Vx/non-vx, vaginal delivery followed by internal podalic version of second twin will achieve vaginal delivery. In non-vx first twin presentation, cesarean delivery will be performed.  I emphasized the importance of weight gain (24 lbs by 24 weeks) to improve fetal weight and decrease the chances of preterm delivery. Prophylactic low-dose aspirin delays or prevents preeclampsia. Twin pregnancy  is at high risk  for preeclampsia. I recommended aspirin (81 mg daily) to be started from now till delivery. She does not have contraindications to aspirin.  Gestational diabetes I explained the diagnosis of gestational diabetes.  I emphasized the importance of good blood glucose control to prevent adverse fetal or neonatal outcomes.  I discussed normal range of blood glucose values. I encouraged her to check her blood glucose regularly. Possible complications of gestational diabetes include fetal macrosomia, shoulder dystocia and birth injuries, stillbirth (in poorly controlled diabetes) and neonatal respiratory syndrome and other complications.  Medical treatment includes oral hypoglycemics or insulin. If diabetes is not well controlled on metformin, insulin should be initiated.  Timing of delivery: In well-controlled diabetes on diet, patient can be delivered at 36- or 40-weeks' gestation. Vaginal delivery is not contraindicated. Type 2 diabetes develops in up to 50% of women with GDM. I recommend postpartum screening with 75-g glucose load at 6 to 12 weeks after delivery.   Screening for fetal aneuploidies Patient wanted to discuss screening for aneuploidies.  I discussed cell-free fetal DNA screening, its significance, and limitations.  Maternal obesity can give a nonreportable result. Cell free fetal DNA screening can also determine chorionicity Avelina Laine).  I informed her that only invasive testing (chorionic villous sampling or amniocentesis) will give a definitive result on the fetal karyotype. Patient opted to have cell free fetal DNA screening. Blood was drawn at our office (panorama).  Will communicate the results to the patient.  Venlafaxine is an antidepressant and is in the same subclass of serotonin-norepinephrine reuptake inhibitor (SNRI). It crosses the placenta. No increased congenital malformations are expected with the use of this drug. A small absolute increase in primary pulmonary hypertension  is expected and the patient was informed. However, depression can have adverse effects in pregnancy. If maternal benefits of taking this drug is considerable, she should continue this medication in pregnancy.  I reassured the patient that the risks of fetal congenital malformations are not increased.   Recommendations -An appointment was made for her to return in 7 weeks for detailed fetal anatomical survey. -Will communicate cell-free fetal DNA screening results to the patient. -Fetal growth assessments every 4 weeks. -BPP at 36 and [redacted] weeks gestation. -Delivery at [redacted] weeks gestation. -Consider consultation with diabetic educator.   Thank you for consultation.  If you have any questions or concerns, please contact me the [facility].  Consultation including face-to-face (more than 50%) counseling 45 minutes.

## 2022-02-20 LAB — PANORAMA PRENATAL TEST FULL PANEL:PANORAMA TEST PLUS 5 ADDITIONAL MICRODELETIONS
FETAL FRACTION SECOND FETUS: 2.8
FETAL FRACTION: 4

## 2022-02-21 ENCOUNTER — Telehealth: Payer: Self-pay | Admitting: Genetics

## 2022-02-21 NOTE — Telephone Encounter (Signed)
Isabel Higgins was contacted by telephone to review their cell-free DNA screening (cfDNA) result. The result is low risk, consistent with a dizygotic twins. This screening significantly reduces the risk that the current pregnancy has Down syndrome, Trisomy 37, and Trisomy 13. Isabel Higgins understands that this is a screening and not a diagnostic test. All questions answered.

## 2022-02-26 ENCOUNTER — Ambulatory Visit: Payer: Managed Care, Other (non HMO) | Admitting: *Deleted

## 2022-02-28 ENCOUNTER — Encounter: Payer: Self-pay | Admitting: Emergency Medicine

## 2022-02-28 ENCOUNTER — Other Ambulatory Visit: Payer: Self-pay

## 2022-02-28 ENCOUNTER — Emergency Department: Payer: Managed Care, Other (non HMO)

## 2022-02-28 ENCOUNTER — Emergency Department
Admission: EM | Admit: 2022-02-28 | Discharge: 2022-02-28 | Disposition: A | Discharge status: Home / Self Care | Payer: Managed Care, Other (non HMO) | Attending: Emergency Medicine | Admitting: Emergency Medicine

## 2022-02-28 DIAGNOSIS — Z3A15 15 weeks gestation of pregnancy: Secondary | ICD-10-CM | POA: Insufficient documentation

## 2022-02-28 DIAGNOSIS — Z6710 Type A blood, Rh positive: Secondary | ICD-10-CM | POA: Diagnosis not present

## 2022-02-28 DIAGNOSIS — H538 Other visual disturbances: Secondary | ICD-10-CM | POA: Insufficient documentation

## 2022-02-28 DIAGNOSIS — O99891 Other specified diseases and conditions complicating pregnancy: Secondary | ICD-10-CM | POA: Diagnosis not present

## 2022-02-28 DIAGNOSIS — Z20822 Contact with and (suspected) exposure to covid-19: Secondary | ICD-10-CM | POA: Insufficient documentation

## 2022-02-28 DIAGNOSIS — R531 Weakness: Secondary | ICD-10-CM | POA: Insufficient documentation

## 2022-02-28 DIAGNOSIS — R252 Cramp and spasm: Secondary | ICD-10-CM | POA: Insufficient documentation

## 2022-02-28 DIAGNOSIS — O26892 Other specified pregnancy related conditions, second trimester: Secondary | ICD-10-CM | POA: Diagnosis present

## 2022-02-28 LAB — RESP PANEL BY RT-PCR (RSV, FLU A&B, COVID)  RVPGX2
Influenza A by PCR: NEGATIVE
Influenza B by PCR: NEGATIVE
Resp Syncytial Virus by PCR: NEGATIVE
SARS Coronavirus 2 by RT PCR: NEGATIVE

## 2022-02-28 LAB — COMPREHENSIVE METABOLIC PANEL
ALT: 8 U/L (ref 0–44)
AST: 20 U/L (ref 15–41)
Albumin: 3.2 g/dL — ABNORMAL LOW (ref 3.5–5.0)
Alkaline Phosphatase: 66 U/L (ref 38–126)
Anion gap: 5 (ref 5–15)
BUN: 8 mg/dL (ref 6–20)
CO2: 24 mmol/L (ref 22–32)
Calcium: 9.1 mg/dL (ref 8.9–10.3)
Chloride: 106 mmol/L (ref 98–111)
Creatinine, Ser: 0.47 mg/dL (ref 0.44–1.00)
GFR, Estimated: >60 mL/min (ref >60)
Glucose, Bld: 95 mg/dL (ref 70–99)
Potassium: 3.4 mmol/L — ABNORMAL LOW (ref 3.5–5.1)
Sodium: 135 mmol/L (ref 135–145)
Total Bilirubin: 0.3 mg/dL (ref 0.3–1.2)
Total Protein: 6.9 g/dL (ref 6.5–8.1)

## 2022-02-28 LAB — URINALYSIS, ROUTINE W REFLEX MICROSCOPIC
Bilirubin Urine: NEGATIVE
Glucose, UA: NEGATIVE mg/dL
Hgb urine dipstick: NEGATIVE
Ketones, ur: NEGATIVE mg/dL
Leukocytes,Ua: NEGATIVE
Nitrite: NEGATIVE
Protein, ur: NEGATIVE mg/dL
Specific Gravity, Urine: 1.026 (ref 1.005–1.030)
pH: 5 (ref 5.0–8.0)

## 2022-02-28 LAB — CBC WITH DIFFERENTIAL/PLATELET
Abs Immature Granulocytes: 0.04 10*3/uL (ref 0.00–0.07)
Basophils Absolute: 0 10*3/uL (ref 0.0–0.1)
Basophils Relative: 0 %
Eosinophils Absolute: 0 10*3/uL (ref 0.0–0.5)
Eosinophils Relative: 0 %
HCT: 38.2 % (ref 36.0–46.0)
Hemoglobin: 12.9 g/dL (ref 12.0–15.0)
Immature Granulocytes: 0 %
Lymphocytes Relative: 21 %
Lymphs Abs: 2 10*3/uL (ref 0.7–4.0)
MCH: 29.7 pg (ref 26.0–34.0)
MCHC: 33.8 g/dL (ref 30.0–36.0)
MCV: 88 fL (ref 80.0–100.0)
Monocytes Absolute: 0.5 10*3/uL (ref 0.1–1.0)
Monocytes Relative: 5 %
Neutro Abs: 6.8 10*3/uL (ref 1.7–7.7)
Neutrophils Relative %: 74 %
Platelets: 195 10*3/uL (ref 150–400)
RBC: 4.34 MIL/uL (ref 3.87–5.11)
RDW: 14.6 % (ref 11.5–15.5)
WBC: 9.3 10*3/uL (ref 4.0–10.5)
nRBC: 0 % (ref 0.0–0.2)

## 2022-02-28 LAB — POC URINE PREG, ED: Preg Test, Ur: POSITIVE — AB

## 2022-02-28 LAB — ABO/RH: ABO/RH(D): A POS

## 2022-02-28 LAB — OB RESULTS CONSOLE HIV ANTIBODY (ROUTINE TESTING): HIV: NONREACTIVE

## 2022-02-28 LAB — OB RESULTS CONSOLE RPR: RPR: NONREACTIVE

## 2022-02-28 LAB — MAGNESIUM: Magnesium: 1.8 mg/dL (ref 1.7–2.4)

## 2022-02-28 MED ORDER — ACETAMINOPHEN 325 MG PO TABS
650.0000 mg | ORAL_TABLET | Freq: Once | ORAL | Status: AC
  Administered 2022-02-28: 650 mg via ORAL
  Filled 2022-02-28: qty 2

## 2022-02-28 MED ORDER — IOHEXOL 350 MG/ML SOLN
75.0000 mL | Freq: Once | INTRAVENOUS | Status: AC | PRN
  Administered 2022-02-28: 75 mL via INTRAVENOUS

## 2022-02-28 MED ORDER — SODIUM CHLORIDE 0.9 % IV SOLN
12.5000 mg | Freq: Four times a day (QID) | INTRAVENOUS | Status: DC | PRN
  Filled 2022-02-28: qty 0.5

## 2022-02-28 NOTE — Discharge Instructions (Addendum)
Continue monitoring her blood pressure at home.

## 2022-02-28 NOTE — ED Provider Notes (Signed)
[facility] Provider Note  Patient Contact: 3:31 PM (approximate)   History   Weakness (Patient is 15 weeks 2 days pregnant (G3P2) with twins, is here today for generalized weakness and intermittent cramping; Says she "just doesn't feel right")   HPI  Isabel Higgins is a 26 y.o. female G3, P2 presents to the emergency department with concern for headache, double vision and blurry vision that started this morning.  Patient states that she has had several episodes of vomiting since 12/2.  Patient is currently [redacted] weeks pregnant with twins and is under the care of of Dr. Dalbert Garnet at [place] [facility].  Patient states that she has felt somewhat weak today with some cramping but no vaginal bleeding.  She states that she has not had consistent fetal movement detected throughout this pregnancy and has not detected very much fetal movement today.  She denies chest pain, chest tightness or shortness of breath.  She reports that her blood pressure was elevated at [facility] and they referred her to the emergency department.  She states that her blood pressure was approximately 140/90.  She states that she has been taking her blood pressure at home and it has been 130/80.  Patient has not had to take antihypertensives with prior pregnancies and has never been diagnosed with preeclampsia/eclampsia.      Physical Exam   Triage Vital Signs: ED Triage Vitals [02/28/22 1438]  Enc Vitals Group     BP 130/77     Pulse Rate (!) 118     Resp 17     Temp 98.1 F (36.7 C)     Temp Source Oral     SpO2 95 %     Weight      Height      Head Circumference      Peak Flow      Pain Score      Pain Loc      Pain Edu?      Excl. in GC?     Most recent vital signs: Vitals:   02/28/22 1623 02/28/22 1759  BP: 123/82   Pulse:    Resp:    Temp:  97.9 F (36.6 C)  SpO2:       General: Alert and in no acute distress. Eyes:  PERRL. EOMI. Head: No acute traumatic  findings ENT:      Nose: No congestion/rhinnorhea.      Mouth/Throat: Mucous membranes are moist. Neck: No stridor. No cervical spine tenderness to palpation. Cardiovascular:  Good peripheral perfusion Respiratory: Normal respiratory effort without tachypnea or retractions. Lungs CTAB. Good air entry to the bases with no decreased or absent breath sounds. Gastrointestinal: Bowel sounds 4 quadrants. Soft and nontender to palpation. No guarding or rigidity. No palpable masses. No distention. No CVA tenderness. Musculoskeletal: Full range of motion to all extremities.  Neurologic:  No gross focal neurologic deficits are appreciated.  Skin:   No rash noted    ED Results / Procedures / Treatments   Labs (all labs ordered are listed, but only abnormal results are displayed) Labs Reviewed  COMPREHENSIVE METABOLIC PANEL - Abnormal; Notable for the following components:      Result Value   Potassium 3.4 (*)    Albumin 3.2 (*)    All other components within normal limits  URINALYSIS, ROUTINE W REFLEX MICROSCOPIC - Abnormal; Notable for the following components:   Color, Urine YELLOW (*)    APPearance HAZY (*)    All  other components within normal limits  POC URINE PREG, ED - Abnormal; Notable for the following components:   Preg Test, Ur Positive (*)    All other components within normal limits  RESP PANEL BY RT-PCR (RSV, FLU A&B, COVID)  RVPGX2  CBC WITH DIFFERENTIAL/PLATELET  MAGNESIUM  ABO/RH        RADIOLOGY  I personally viewed and evaluated these images as part of my medical decision making, as well as reviewing the written report by the radiologist.  ED Provider Interpretation: OB ultrasound shows dichorionic diamniotic twins with fetal heart tones at 144 and 153 bpm respectively.  No acute abnormality on CT venogram.   PROCEDURES:  Critical Care performed: No  Procedures   MEDICATIONS ORDERED IN ED: Medications  promethazine (PHENERGAN) 12.5 mg in sodium  chloride 0.9 % 50 mL IVPB (has no administration in time range)  acetaminophen (TYLENOL) tablet 650 mg (650 mg Oral Given 02/28/22 1535)  iohexol (OMNIPAQUE) 350 MG/ML injection 75 mL (75 mLs Intravenous Contrast Given 02/28/22 1638)     IMPRESSION / MDM / ASSESSMENT AND PLAN / ED COURSE  I reviewed the triage vital signs and the nursing notes.                              Assessment and plan Headache 26 year old female G3, P2 presents to the emergency department with new onset headache, double vision and blurry vision that started today with possible elevated blood pressure at OB/GYN's office earlier in the day.  Patient was tachycardic at triage with normal blood pressure.  On exam, patient was alert, active and nontoxic-appearing with no neurodeficits.  Differential diagnosis includes hypertension in pregnancy, venous sinus thrombosis, dehydration, electrolyte abnormalities...  CBC and CMP within reference range.  Patient is O+.  Urinalysis unremarkable for UTI.  Patient was COVID and flu negative.  Dedicated OB ultrasound indicated dichorionic diamniotic twins at 144 at 153 bpm respectively.  No evidence of venous sinus thrombosis on CT venogram.  Upon reassessment, patient's blood pressure was within range.  Recommended keeping a blood pressure diary over the next several weeks and following up with OB/GYN.     FINAL CLINICAL IMPRESSION(S) / ED DIAGNOSES   Final diagnoses:  Blurry vision     Rx / DC Orders   ED Discharge Orders     None        Note:  This document was prepared using Dragon voice recognition software and may include unintentional dictation errors.   Pia Mau [place], PA-C 02/28/22 2247    Dionne Bucy, MD 03/01/22 [phone]

## 2022-02-28 NOTE — ED Provider Triage Note (Signed)
  Emergency Medicine Provider Triage Evaluation Note  Isabel Higgins , a 26 y.o.female,  was evaluated in triage.  Pt complains of hypertension.  She states that she is [redacted] weeks pregnant and is slowly started to notice her blood pressure rising.  She states that it was over 140 systolic today.  She states that she is also seeing some spots in her vision as well.  Mild abdominal cramping, no vaginal bleeding.   Review of Systems  Positive: Visual changes, hypertension Negative: Denies fever, chest pain, vomiting  Physical Exam  There were no vitals filed for this visit. Gen:   Awake, no distress   Resp:  Normal effort  MSK:   Moves extremities without difficulty  Other:    Medical Decision Making  Given the patient's initial medical screening exam, the following diagnostic evaluation has been ordered. The patient will be placed in the appropriate treatment space, once one is available, to complete the evaluation and treatment. I have discussed the plan of care with the patient and I have advised the patient that an ED physician or mid-level practitioner will reevaluate their condition after the test results have been received, as the results may give them additional insight into the type of treatment they may need.    Diagnostics: Labs, urinalysis  Treatments: none immediately   Varney Daily, [place] 02/28/22 1432

## 2022-02-28 NOTE — ED Triage Notes (Signed)
Patient is 15 weeks 2 days pregnant (G3P2) with twins, is here today for generalized weakness and intermittent cramping; Says she "just doesn't feel right"

## 2022-02-28 NOTE — ED Notes (Signed)
Pt to ED via POV, pt states that she was instructed by her OB to come to [facility] to be monitored. This RN called and spoke with Duwayne Heck, provider in [facility]. Duwayne Heck confirmed that pt is to be seen in the ED since she is less than 20 weeks.

## 2022-03-04 ENCOUNTER — Ambulatory Visit: Payer: Managed Care, Other (non HMO) | Admitting: *Deleted

## 2022-03-07 ENCOUNTER — Encounter: Payer: Self-pay | Admitting: *Deleted

## 2022-03-07 ENCOUNTER — Encounter: Payer: Managed Care, Other (non HMO) | Attending: Obstetrics and Gynecology | Admitting: *Deleted

## 2022-03-07 VITALS — BP 108/68 | Ht 67.0 in | Wt 266.8 lb

## 2022-03-07 DIAGNOSIS — Z713 Dietary counseling and surveillance: Secondary | ICD-10-CM | POA: Insufficient documentation

## 2022-03-07 DIAGNOSIS — O24414 Gestational diabetes mellitus in pregnancy, insulin controlled: Secondary | ICD-10-CM | POA: Diagnosis present

## 2022-03-07 DIAGNOSIS — Z3A00 Weeks of gestation of pregnancy not specified: Secondary | ICD-10-CM | POA: Diagnosis not present

## 2022-03-07 NOTE — Progress Notes (Signed)
Diabetes Self-Management Education  Visit Type: First/Initial  Appt. Start Time: 1325 Appt. End Time: 1445  03/07/2022  [name], identified by name and date of birth, is a 26 y.o. female with a diagnosis of Diabetes: Gestational Diabetes.   ASSESSMENT  Blood pressure 108/68, height 5\' 7"  (1.702 m), weight 266 lb 12.8 oz (121 kg), last menstrual period 11/10/2021, estimated date of delivery 08/17/2022 - twins Body mass index is 41.79 kg/m.   Diabetes Self-Management Education - 03/07/22 1501       Visit Information   Visit Type First/Initial      Initial Visit   Diabetes Type Gestational Diabetes    Date Diagnosed "a few weeks"    Are you currently following a meal plan? No    Are you taking your medications as prescribed? Yes      Health Coping   How would you rate your overall health? Good      Psychosocial Assessment   Patient Belief/Attitude about Diabetes Other (comment)   "nervous"   What is the hardest part about your diabetes right now, causing you the most concern, or is the most worrisome to you about your diabetes?   Making healty food and beverage choices    Self-care barriers None    Self-management support Doctor's office;Family    Patient Concerns Nutrition/Meal planning;Weight Control;Healthy Lifestyle    Special Needs None    Preferred Learning Style Visual;Hands on    Learning Readiness Ready    How often do you need to have someone help you when you read instructions, pamphlets, or other written materials from your doctor or pharmacy? 1 - Never    What is the last grade level you completed in school? some college      Pre-Education Assessment   Patient understands the diabetes disease and treatment process. Needs Review    Patient understands incorporating nutritional management into lifestyle. Needs Instruction    Patient undertands incorporating physical activity into lifestyle. Needs Instruction    Patient understands using medications  safely. Needs Review    Patient understands monitoring blood glucose, interpreting and using results Needs Review    Patient understands prevention, detection, and treatment of acute complications. Needs Instruction    Patient understands prevention, detection, and treatment of chronic complications. Needs Review    Patient understands how to develop strategies to address psychosocial issues. Needs Instruction    Patient understands how to develop strategies to promote health/change behavior. Needs Instruction      Complications   Last HgB A1C per patient/outside source 5.7 %   01/09/2022   How often do you check your blood sugar? 3-4 times/day   She didn't bring BG records.   Fasting Blood glucose range (mg/dL) 01/11/2022   She reports FBG's 100-110's mg/dL.   Postprandial Blood glucose range (mg/dL) 40-981   She reports pp's 120-130's mg/dL.   Have you had a dilated eye exam in the past 12 months? No    Have you had a dental exam in the past 12 months? No    Are you checking your feet? No      Dietary Intake   Breakfast peanut butter crackers, apple, sausage and cheese biscuit, cereal and milk    Snack (morning) 2 snacks/day - nuts, fruit (orange, apple)    Lunch left overs from home; hospital cafeteria - sub    Dinner chicken, beef, pork, occasional shrimp; potatoes, green beans, corn, pasta, rice, baked beans, squash, zucchini, onions    Beverage(s)  water, juice, soda, Gatorade, Crystal Light      Activity / Exercise   Activity / Exercise Type ADL's      Patient Education   Previous Diabetes Education No    Disease Pathophysiology Definition of diabetes, type 1 and 2, and the diagnosis of diabetes;Factors that contribute to the development of diabetes;Explored patient's options for treatment of their diabetes    Healthy Eating Role of diet in the treatment of diabetes and the relationship between the three main macronutrients and blood glucose level;Food label reading, portion  sizes and measuring food.;Reviewed blood glucose goals for pre and post meals and how to evaluate the patients' food intake on their blood glucose level.;Meal timing in regards to the patients' current diabetes medication.    Being Active Role of exercise on diabetes management, blood pressure control and cardiac health.    Medications Taught/reviewed insulin/injectables, injection, site rotation, insulin/injectables storage and needle disposal.;Reviewed patients medication for diabetes, action, purpose, timing of dose and side effects. Pt has given herself injections of Saxenda and currently Humira.    Monitoring Purpose and frequency of SMBG.;Taught/discussed recording of test results and interpretation of SMBG.;Identified appropriate SMBG and/or A1C goals.;Ketone testing, when, how.    Acute complications Taught prevention, symptoms, and  treatment of hypoglycemia - the 15 rule.    Chronic complications Relationship between chronic complications and blood glucose control    Diabetes Stress and Support Identified and addressed patients feelings and concerns about diabetes;Other (comment)   Discussed use of counselor - she denied need for referral   Preconception care Pregnancy and GDM  Role of pre-pregnancy blood glucose control on the development of the fetus;Reviewed with patient blood glucose goals with pregnancy;Role of family planning for patients with diabetes      Individualized Goals (developed by patient)   Reducing Risk Other (comment)   lose weight, lead a healthier lifestyle     Outcomes   Expected Outcomes Demonstrated interest in learning. Expect positive outcomes    Future DMSE 2 wks         Individualized Plan for Diabetes Self-Management Training:   Learning Objective:  Patient will have a greater understanding of diabetes self-management. Patient education plan is to attend individual and/or group sessions per assessed needs and concerns.   Plan:   Patient Instructions   Read booklet on Gestational Diabetes Follow Gestational Meal Planning Guidelines Avoid sugar sweetened drinks unless treating a low blood sugar Complete a 3 Day Food Record and bring to next appointment Check blood sugars 4 x day - before breakfast and 2 hrs after every meal and record  Bring blood sugar log to all appointments Purchase urine ketone strips if instructed by MD and check urine ketones every am:  If + increase bedtime snack to 1 protein and 2 carbohydrate servings Walk 20-30 minutes at least 5 x week if permitted by MD  Carry fast acting glucose and a snack at all times Rotate injection sites Hold insulin pen in place for 5-10 seconds after injection Roll cloudy insulin (N) Give morning insulin 30 minutes before breakfast and evening insulin 30 minutes before supper  Morning insulin Humulin/Novolin    N  (cloudy)   24 units Humulin/Novolin    R  (clear)     12     units  Evening insulin Humulin/Novolin    N (cloudy)       9    units Humulin/Novolin    R (clear)        9    units                                  Expected Outcomes:  Demonstrated interest in learning. Expect positive outcomes  Education material provided:  Gestational Booklet Gestational Meal Planning Guidelines Simple Meal Plan 3 Day Food Record Goals for a Healthy Pregnancy Injection Guide (BD) Glucose tablets Symptoms, causes and treatments of Hypoglycemia  If problems or questions, patient to contact team via:   Sharion Settler, RN, CCM, CDCES [phone]  Future DSME appointment: 2 wks March 29, 2022 with the dietitian

## 2022-03-07 NOTE — Patient Instructions (Addendum)
Read booklet on Gestational Diabetes Follow Gestational Meal Planning Guidelines Avoid sugar sweetened drinks unless treating a low blood sugar Complete a 3 Day Food Record and bring to next appointment Check blood sugars 4 x day - before breakfast and 2 hrs after every meal and record  Bring blood sugar log to all appointments Purchase urine ketone strips if instructed by MD and check urine ketones every am:  If + increase bedtime snack to 1 protein and 2 carbohydrate servings Walk 20-30 minutes at least 5 x week if permitted by MD  Carry fast acting glucose and a snack at all times Rotate injection sites Hold insulin pen in place for 5-10 seconds after injection Roll cloudy insulin (N) Give morning insulin 30 minutes before breakfast and evening insulin 30 minutes before supper  Morning insulin Humulin/Novolin    N  (cloudy)   24 units Humulin/Novolin    R  (clear)     12     units                                                          Evening insulin Humulin/Novolin    N (cloudy)       9    units Humulin/Novolin    R (clear)        9    units

## 2022-03-14 ENCOUNTER — Ambulatory Visit: Payer: Managed Care, Other (non HMO) | Admitting: *Deleted

## 2022-03-29 ENCOUNTER — Encounter: Payer: Managed Care, Other (non HMO) | Attending: Obstetrics and Gynecology | Admitting: Dietician

## 2022-03-29 ENCOUNTER — Encounter: Payer: Self-pay | Admitting: Dietician

## 2022-03-29 VITALS — BP 100/60 | Ht 67.0 in | Wt 269.0 lb

## 2022-03-29 DIAGNOSIS — O24414 Gestational diabetes mellitus in pregnancy, insulin controlled: Secondary | ICD-10-CM | POA: Insufficient documentation

## 2022-03-29 NOTE — Progress Notes (Signed)
Patient's BG record indicates fasting BGs ranging 98-120, and post-meal BGs ranging 97-142. Patient's food intake recall indicates she is eating at regular intervals, controlling carb amounts (unless having soda or juice), including protein sources with meals and some snacks. She reports working to decrease soda and juice intake.  Discussed options for further reduction in sugar content of beverages. Also instructed on limiting portion of cereal, choosing whole grain and low sugar options, and including protein source. Provided basic balanced meal plan, and wrote individualized menus based on patient's food preferences. Instructed patient on food safety, including avoidance of Listeriosis, and limiting mercury from fish. Reviewed treatment for hypoglycemia and importance of keeping treatment close by at all times. Discussed importance of maintaining healthy lifestyle habits to reduce risk of Type 2 DM as well as Gestational DM with any future pregnancies. Advised patient to use any remaining testing supplies to test some BGs after delivery, and to have BG tested ideally annually, as well as prior to attempting future pregnancies.

## 2022-03-29 NOTE — Patient Instructions (Signed)
If having a craving for soda, keep to a small amount, ideally 4oz or less, and sip slowly. 4oz soda counts as 1 serving of carbs or about 15g.  Try low sugar juice drinks such as Trop50 or Ocean spray Diet 5. Or dilute juice with at least 1/2 water or crystal light. Continue to include some activity, like active play with kids, most days of the week.

## 2022-04-03 ENCOUNTER — Ambulatory Visit: Payer: Managed Care, Other (non HMO) | Attending: Obstetrics and Gynecology

## 2022-04-03 ENCOUNTER — Ambulatory Visit: Payer: Managed Care, Other (non HMO) | Admitting: *Deleted

## 2022-04-03 ENCOUNTER — Other Ambulatory Visit: Payer: Self-pay | Admitting: Obstetrics and Gynecology

## 2022-04-03 VITALS — BP 121/64 | HR 105

## 2022-04-03 DIAGNOSIS — O30049 Twin pregnancy, dichorionic/diamniotic, unspecified trimester: Secondary | ICD-10-CM | POA: Diagnosis present

## 2022-04-03 DIAGNOSIS — E669 Obesity, unspecified: Secondary | ICD-10-CM

## 2022-04-03 DIAGNOSIS — Z3A20 20 weeks gestation of pregnancy: Secondary | ICD-10-CM

## 2022-04-03 DIAGNOSIS — O30041 Twin pregnancy, dichorionic/diamniotic, first trimester: Secondary | ICD-10-CM | POA: Insufficient documentation

## 2022-04-03 DIAGNOSIS — O30042 Twin pregnancy, dichorionic/diamniotic, second trimester: Secondary | ICD-10-CM | POA: Diagnosis not present

## 2022-04-03 DIAGNOSIS — O99212 Obesity complicating pregnancy, second trimester: Secondary | ICD-10-CM

## 2022-04-03 DIAGNOSIS — O09292 Supervision of pregnancy with other poor reproductive or obstetric history, second trimester: Secondary | ICD-10-CM

## 2022-04-03 DIAGNOSIS — O24414 Gestational diabetes mellitus in pregnancy, insulin controlled: Secondary | ICD-10-CM | POA: Diagnosis not present

## 2022-04-03 DIAGNOSIS — Z79899 Other long term (current) drug therapy: Secondary | ICD-10-CM

## 2022-04-30 ENCOUNTER — Other Ambulatory Visit: Payer: Self-pay

## 2022-04-30 DIAGNOSIS — O24414 Gestational diabetes mellitus in pregnancy, insulin controlled: Secondary | ICD-10-CM

## 2022-04-30 DIAGNOSIS — O365990 Maternal care for other known or suspected poor fetal growth, unspecified trimester, not applicable or unspecified: Secondary | ICD-10-CM

## 2022-05-02 ENCOUNTER — Ambulatory Visit: Payer: Managed Care, Other (non HMO) | Attending: Maternal & Fetal Medicine

## 2022-05-02 ENCOUNTER — Other Ambulatory Visit: Payer: Self-pay

## 2022-05-02 DIAGNOSIS — O09293 Supervision of pregnancy with other poor reproductive or obstetric history, third trimester: Secondary | ICD-10-CM | POA: Insufficient documentation

## 2022-05-02 DIAGNOSIS — O99213 Obesity complicating pregnancy, third trimester: Secondary | ICD-10-CM | POA: Diagnosis present

## 2022-05-02 DIAGNOSIS — O30043 Twin pregnancy, dichorionic/diamniotic, third trimester: Secondary | ICD-10-CM | POA: Insufficient documentation

## 2022-05-02 DIAGNOSIS — O30042 Twin pregnancy, dichorionic/diamniotic, second trimester: Secondary | ICD-10-CM | POA: Diagnosis not present

## 2022-05-02 DIAGNOSIS — O09292 Supervision of pregnancy with other poor reproductive or obstetric history, second trimester: Secondary | ICD-10-CM

## 2022-05-02 DIAGNOSIS — O24419 Gestational diabetes mellitus in pregnancy, unspecified control: Secondary | ICD-10-CM | POA: Diagnosis not present

## 2022-05-02 DIAGNOSIS — Z3A32 32 weeks gestation of pregnancy: Secondary | ICD-10-CM | POA: Diagnosis not present

## 2022-05-02 DIAGNOSIS — O99212 Obesity complicating pregnancy, second trimester: Secondary | ICD-10-CM | POA: Diagnosis not present

## 2022-05-02 DIAGNOSIS — O365990 Maternal care for other known or suspected poor fetal growth, unspecified trimester, not applicable or unspecified: Secondary | ICD-10-CM

## 2022-05-02 DIAGNOSIS — O24414 Gestational diabetes mellitus in pregnancy, insulin controlled: Secondary | ICD-10-CM | POA: Diagnosis not present

## 2022-05-02 DIAGNOSIS — E669 Obesity, unspecified: Secondary | ICD-10-CM

## 2022-05-02 DIAGNOSIS — O1493 Unspecified pre-eclampsia, third trimester: Secondary | ICD-10-CM | POA: Insufficient documentation

## 2022-05-02 DIAGNOSIS — Z79899 Other long term (current) drug therapy: Secondary | ICD-10-CM

## 2022-05-02 DIAGNOSIS — Z3A24 24 weeks gestation of pregnancy: Secondary | ICD-10-CM

## 2022-05-28 ENCOUNTER — Encounter: Payer: Self-pay | Admitting: Obstetrics and Gynecology

## 2022-05-28 ENCOUNTER — Other Ambulatory Visit: Payer: Self-pay

## 2022-05-28 ENCOUNTER — Observation Stay
Admission: EM | Admit: 2022-05-28 | Discharge: 2022-05-28 | Disposition: A | Discharge status: Home / Self Care | Payer: Managed Care, Other (non HMO) | Attending: Certified Nurse Midwife | Admitting: Certified Nurse Midwife

## 2022-05-28 DIAGNOSIS — Z3A28 28 weeks gestation of pregnancy: Secondary | ICD-10-CM | POA: Diagnosis not present

## 2022-05-28 DIAGNOSIS — O30043 Twin pregnancy, dichorionic/diamniotic, third trimester: Secondary | ICD-10-CM

## 2022-05-28 DIAGNOSIS — O0993 Supervision of high risk pregnancy, unspecified, third trimester: Secondary | ICD-10-CM | POA: Diagnosis not present

## 2022-05-28 DIAGNOSIS — M549 Dorsalgia, unspecified: Secondary | ICD-10-CM | POA: Diagnosis present

## 2022-05-28 DIAGNOSIS — O2693 Pregnancy related conditions, unspecified, third trimester: Principal | ICD-10-CM | POA: Insufficient documentation

## 2022-05-28 DIAGNOSIS — O99891 Other specified diseases and conditions complicating pregnancy: Secondary | ICD-10-CM | POA: Diagnosis present

## 2022-05-28 DIAGNOSIS — O99210 Obesity complicating pregnancy, unspecified trimester: Secondary | ICD-10-CM

## 2022-05-28 DIAGNOSIS — Z8759 Personal history of other complications of pregnancy, childbirth and the puerperium: Secondary | ICD-10-CM

## 2022-05-28 DIAGNOSIS — O24419 Gestational diabetes mellitus in pregnancy, unspecified control: Secondary | ICD-10-CM

## 2022-05-28 LAB — WET PREP, GENITAL
Clue Cells Wet Prep HPF POC: NONE SEEN
Sperm: NONE SEEN
Trich, Wet Prep: NONE SEEN
WBC, Wet Prep HPF POC: >=10 — AB (ref <10)
Yeast Wet Prep HPF POC: NONE SEEN

## 2022-05-28 LAB — URINALYSIS, ROUTINE W REFLEX MICROSCOPIC
Bilirubin Urine: NEGATIVE
Glucose, UA: NEGATIVE mg/dL
Hgb urine dipstick: NEGATIVE
Ketones, ur: 5 mg/dL — AB
Leukocytes,Ua: NEGATIVE
Nitrite: NEGATIVE
Protein, ur: NEGATIVE mg/dL
Specific Gravity, Urine: 1.019 (ref 1.005–1.030)
pH: 6 (ref 5.0–8.0)

## 2022-05-28 LAB — FETAL FIBRONECTIN: Fetal Fibronectin: NEGATIVE

## 2022-05-28 MED ORDER — CYCLOBENZAPRINE HCL 5 MG PO TABS
5.0000 mg | ORAL_TABLET | Freq: Once | ORAL | Status: AC
  Administered 2022-05-28: 5 mg via ORAL
  Filled 2022-05-28: qty 1

## 2022-05-28 MED ORDER — ZOLPIDEM TARTRATE 5 MG PO TABS: 5.0000 mg | ORAL_TABLET | Freq: Every evening | ORAL | Status: DC | PRN

## 2022-05-28 MED ORDER — ACETAMINOPHEN 325 MG PO TABS: 650.0000 mg | ORAL_TABLET | ORAL | Status: DC | PRN

## 2022-05-28 MED ORDER — LACTATED RINGERS IV SOLN: 125.0000 mL/h | INTRAVENOUS | Status: DC

## 2022-05-28 MED ORDER — CALCIUM CARBONATE ANTACID 500 MG PO CHEW: 2.0000 | CHEWABLE_TABLET | ORAL | Status: DC | PRN

## 2022-05-28 NOTE — OB Triage Note (Addendum)
Pt discharged home per order.   Pt stable and ambulatory and an After Visit Summary was printed and given to the patient. Discharge education completed with patient/family including follow up instructions, appointments, and medication list. Pt received labor and bleeding precautions. Patient able to verbalize understanding, all questions fully answered upon discharge. Patient instructed to return to ED, call 911, or call MD for any changes in condition.  Flexeril given- pt alert and stable for discharge.

## 2022-05-28 NOTE — Discharge Summary (Signed)
Isabel Higgins is a 27 y.o. female. She is at 31w0dgestation. Patient's last menstrual period was 11/10/2021. Estimated Date of Delivery: 08/20/22  [facility]  Current pregnancy complicated by:  - di/di twins - GDM A2 - Obesity - Hx of macrosomia - Hx of postpartum hemorrhage - Hx of gHTN  Chief complaint: low back pain  She reported low back pain that started at 8am and radiates into her hips. She denies any uterine contractions but reports the pain is similar to the pain she felt when she was going into labor with her first child. She denies vaginal discharge, vaginal bleeding, or UTI symptoms. She took Tylenol around 0800 and the pain did not improve.   S: Resting comfortably. no CTX, no VB.no LOF,  Active fetal movement. Denies: HA, visual changes, SOB, or RUQ/epigastric pain  Maternal Medical History:   Past Medical History:  Diagnosis Date   Bradycardia    Depression    GERD (gastroesophageal reflux disease)    Gestational diabetes    Hemorrhoids during pregnancy in third trimester    Menometrorrhagia 08/07/2016   Mental disorder    Morbid [place]    Psoriasis     Past Surgical History:  Procedure Laterality Date   INNER EAR SURGERY     right   LAPAROSCOPY N/A 11/19/2018   Procedure: LAPAROSCOPY DIAGNOSTIC AND BIOPSY;  Surgeon: HGae Dry MD;  Location: [facility] ORS;  Service: Gynecology;  Laterality: N/A;   SHOULDER SURGERY     right    Allergies  Allergen Reactions   Honeysuckle Flower [Lonicera] Swelling, Rash and Other (See Comments)    Other Reaction: BREAK OUT, FACIAL SWELLING, WE   Amoxicillin Nausea And Vomiting    Did it involve swelling of the face/tongue/throat, SOB, or low BP? No Did it involve sudden or severe rash/hives, skin peeling, or any reaction on the inside of your mouth or nose? No Did you need to seek medical attention at a hospital or doctor's office? No When did it last happen?     Can  tolerate but gets an upset stomach  If all above answers are "NO", may proceed with cephalosporin use.     Prior to Admission medications   Medication Sig Start Date End Date Taking? Authorizing Provider  ACCU-CHEK GUIDE test strip USE 1 STRIP 4 TIMES A DAY AS DIRECTED 03/19/22  Yes [provider]  Accu-Chek Softclix Lancets lancets SMARTSIG:2 Topical Twice Daily 03/05/22  Yes [provider]  aspirin EC 81 MG tablet Take 162 mg by mouth daily. Swallow whole.   Yes [provider]  Blood Glucose Monitoring Suppl (ACCU-CHEK GUIDE) w/Device KIT See admin instructions. 02/06/22  Yes [provider]  Insulin Pen Needle (PEN NEEDLES 31GX5/16") 31G X 8 MM MISC See admin instructions. 02/21/22 02/21/23 Yes [provider]  metformin (FORTAMET) 1000 MG (OSM) 24 hr tablet Take 1,000 mg by mouth at bedtime.   Yes [provider]  metFORMIN (GLUCOPHAGE) 500 MG tablet Take 500 mg by mouth. 500 mg am and 1000 mg pm 01/11/21  Yes [provider]  omeprazole (PRILOSEC) 40 MG capsule Take 40 mg by mouth daily.   Yes [provider]  Prenatal Vit-Fe Fumarate-FA (PRENATAL MULTIVITAMIN) TABS tablet Take 1 tablet by mouth daily at 12 noon.   Yes [provider]  venlafaxine XR (EFFEXOR-XR) 150 MG 24 hr capsule 150 mg daily with breakfast. 03/06/22  Yes [provider]  HJulianne Rice  PEN 40 MG/0.4ML PNKT SMARTSIG:40 Milligram(s) SUB-Q Every 2 Weeks Patient not taking: Reported on 04/03/2022 11/06/20   [provider]  HUMULIN N KWIKPEN 100 UNIT/ML KwikPen Inject 48 Units into the skin in the morning. 28 AM 20 PM    [provider]  Insulin Regular Human (HUMULIN R) 100 UNIT/ML KwikPen Inject 34 Units into the skin daily. 16 AM 18 PM Pt says she does not use Pen but draws it up 02/21/22   [provider]    Social History: She  reports that she has never smoked. She has never used smokeless tobacco. She  reports that she does not drink alcohol and does not use drugs.  Family History: family history includes Diabetes in her maternal grandfather, mother, and paternal grandfather; Heart attack in her maternal grandmother; Hypertension in her father.  no history of gyn cancers  Review of Systems: A full review of systems was performed and negative except as noted in the HPI.     O:  Ht '5\' 7"'$  (1.702 m)   Wt 123.8 kg   LMP 11/10/2021   BMI 42.76 kg/m  Results for orders placed or performed during the hospital encounter of 05/28/22 (from the past 48 hour(s))  Fetal fibronectin   Collection Time: 05/28/22 11:36 AM  Result Value Ref Range   Fetal Fibronectin NEGATIVE NEGATIVE  Urinalysis, Routine w reflex microscopic -Urine, Clean Catch   Collection Time: 05/28/22 11:36 AM  Result Value Ref Range   Color, Urine YELLOW (A) YELLOW   APPearance HAZY (A) CLEAR   Specific Gravity, Urine 1.019 1.005 - 1.030   pH 6.0 5.0 - 8.0   Glucose, UA NEGATIVE NEGATIVE mg/dL   Hgb urine dipstick NEGATIVE NEGATIVE   Bilirubin Urine NEGATIVE NEGATIVE   Ketones, ur 5 (A) NEGATIVE mg/dL   Protein, ur NEGATIVE NEGATIVE mg/dL   Nitrite NEGATIVE NEGATIVE   Leukocytes,Ua NEGATIVE NEGATIVE  Wet prep, genital   Collection Time: 05/28/22 11:37 AM  Result Value Ref Range   Yeast Wet Prep HPF POC NONE SEEN NONE SEEN   Trich, Wet Prep NONE SEEN NONE SEEN   Clue Cells Wet Prep HPF POC NONE SEEN NONE SEEN   WBC, Wet Prep HPF POC >=10 (A) <10   Sperm NONE SEEN      Constitutional: NAD, AAOx3  HE/ENT: extraocular movements grossly intact, moist mucous membranes CV: RRR PULM: nl respiratory effort, CTABL     Abd: gravid, non-tender, non-distended, soft      Ext: Non-tender, Nonedematous   Psych: mood appropriate, speech normal Pelvic: SSE done: cervix appears closed, thick, no bleeding noted. Cervical exam: fingertip/thick/ballottable  Pelvic exam: normal external genitalia, vulva, vagina, cervix, uterus and  adnexa.  Fetal  monitoring:  Baby A: Cat I Appropriate for GA Baseline: 150 Variability: moderate Accelerations: present x >2 10x10 Decelerations absent Time 57mns  Baby B: Cat 1 Appropriate for GA Baseline: 160bpm Variability: moderate Accelerations: present x >2 10x10 Decelerations absent Time 143ms  Bedside [place] A transverse/oblique, head pointing down Baby B: transverse, head pointing to maternal left  A/P: [age].o. 2864w0dre for antenatal surveillance for low back pain  Principle Diagnosis:  Back pain in pregnancy,  - high risk pregnancy 3rd trimester,  - di/di twins,  - GDM A2  Labor: not present.  Fetal Wellbeing: Reassuring Cat 1 tracing. Low back pain: wet prep and UA negative, she took Tylenol without relief, Flexeril given. Advised to continue taking Tylenol for pain, see a chiropractor, wear  a pregnancy support belt, and/or use kinesiology tape.  D/c home stable, precautions reviewed, follow-up as scheduled.    Gertie Fey, CNM 05/28/2022 12:34 PM

## 2022-05-28 NOTE — Discharge Instructions (Signed)
KT Tape

## 2022-05-29 ENCOUNTER — Ambulatory Visit: Payer: Managed Care, Other (non HMO) | Attending: Obstetrics

## 2022-05-29 ENCOUNTER — Other Ambulatory Visit: Payer: Self-pay

## 2022-05-29 DIAGNOSIS — O24419 Gestational diabetes mellitus in pregnancy, unspecified control: Secondary | ICD-10-CM | POA: Insufficient documentation

## 2022-05-29 DIAGNOSIS — E669 Obesity, unspecified: Secondary | ICD-10-CM

## 2022-05-29 DIAGNOSIS — Z79899 Other long term (current) drug therapy: Secondary | ICD-10-CM

## 2022-05-29 DIAGNOSIS — O09293 Supervision of pregnancy with other poor reproductive or obstetric history, third trimester: Secondary | ICD-10-CM | POA: Diagnosis not present

## 2022-05-29 DIAGNOSIS — O30043 Twin pregnancy, dichorionic/diamniotic, third trimester: Secondary | ICD-10-CM | POA: Diagnosis present

## 2022-05-29 DIAGNOSIS — O321XX0 Maternal care for breech presentation, not applicable or unspecified: Secondary | ICD-10-CM | POA: Insufficient documentation

## 2022-05-29 DIAGNOSIS — Z3A28 28 weeks gestation of pregnancy: Secondary | ICD-10-CM | POA: Diagnosis not present

## 2022-05-29 DIAGNOSIS — O99213 Obesity complicating pregnancy, third trimester: Secondary | ICD-10-CM

## 2022-05-29 DIAGNOSIS — O1493 Unspecified pre-eclampsia, third trimester: Secondary | ICD-10-CM | POA: Diagnosis not present

## 2022-05-29 DIAGNOSIS — O24414 Gestational diabetes mellitus in pregnancy, insulin controlled: Secondary | ICD-10-CM

## 2022-05-29 DIAGNOSIS — Z8759 Personal history of other complications of pregnancy, childbirth and the puerperium: Secondary | ICD-10-CM

## 2022-05-29 DIAGNOSIS — O99210 Obesity complicating pregnancy, unspecified trimester: Secondary | ICD-10-CM

## 2022-05-30 ENCOUNTER — Ambulatory Visit: Payer: Managed Care, Other (non HMO)

## 2022-06-06 ENCOUNTER — Other Ambulatory Visit: Payer: Self-pay

## 2022-06-06 ENCOUNTER — Observation Stay
Admission: EM | Admit: 2022-06-06 | Discharge: 2022-06-06 | Disposition: A | Discharge status: Home / Self Care | Payer: Managed Care, Other (non HMO) | Attending: Obstetrics and Gynecology | Admitting: Obstetrics and Gynecology

## 2022-06-06 ENCOUNTER — Other Ambulatory Visit: Payer: Managed Care, Other (non HMO)

## 2022-06-06 ENCOUNTER — Encounter: Payer: Self-pay | Admitting: Obstetrics and Gynecology

## 2022-06-06 ENCOUNTER — Observation Stay: Payer: Managed Care, Other (non HMO)

## 2022-06-06 DIAGNOSIS — O24414 Gestational diabetes mellitus in pregnancy, insulin controlled: Secondary | ICD-10-CM | POA: Diagnosis not present

## 2022-06-06 DIAGNOSIS — M549 Dorsalgia, unspecified: Secondary | ICD-10-CM | POA: Diagnosis not present

## 2022-06-06 DIAGNOSIS — Z794 Long term (current) use of insulin: Secondary | ICD-10-CM | POA: Insufficient documentation

## 2022-06-06 DIAGNOSIS — O30043 Twin pregnancy, dichorionic/diamniotic, third trimester: Secondary | ICD-10-CM | POA: Diagnosis not present

## 2022-06-06 DIAGNOSIS — Z3A29 29 weeks gestation of pregnancy: Secondary | ICD-10-CM | POA: Insufficient documentation

## 2022-06-06 DIAGNOSIS — O0993 Supervision of high risk pregnancy, unspecified, third trimester: Secondary | ICD-10-CM | POA: Insufficient documentation

## 2022-06-06 DIAGNOSIS — Z7984 Long term (current) use of oral hypoglycemic drugs: Secondary | ICD-10-CM | POA: Diagnosis not present

## 2022-06-06 DIAGNOSIS — Z79899 Other long term (current) drug therapy: Secondary | ICD-10-CM | POA: Diagnosis not present

## 2022-06-06 DIAGNOSIS — Z7982 Long term (current) use of aspirin: Secondary | ICD-10-CM | POA: Insufficient documentation

## 2022-06-06 DIAGNOSIS — R030 Elevated blood-pressure reading, without diagnosis of hypertension: Secondary | ICD-10-CM | POA: Diagnosis present

## 2022-06-06 DIAGNOSIS — O99891 Other specified diseases and conditions complicating pregnancy: Principal | ICD-10-CM | POA: Insufficient documentation

## 2022-06-06 DIAGNOSIS — O163 Unspecified maternal hypertension, third trimester: Secondary | ICD-10-CM | POA: Diagnosis present

## 2022-06-06 LAB — CBC
HCT: 32.2 % — ABNORMAL LOW (ref 36.0–46.0)
Hemoglobin: 10.3 g/dL — ABNORMAL LOW (ref 12.0–15.0)
MCH: 27.3 pg (ref 26.0–34.0)
MCHC: 32 g/dL (ref 30.0–36.0)
MCV: 85.4 fL (ref 80.0–100.0)
Platelets: 208 10*3/uL (ref 150–400)
RBC: 3.77 MIL/uL — ABNORMAL LOW (ref 3.87–5.11)
RDW: 14.5 % (ref 11.5–15.5)
WBC: 12.9 10*3/uL — ABNORMAL HIGH (ref 4.0–10.5)
nRBC: 0 % (ref 0.0–0.2)

## 2022-06-06 LAB — COMPREHENSIVE METABOLIC PANEL
ALT: 12 U/L (ref 0–44)
AST: 17 U/L (ref 15–41)
Albumin: 2.8 g/dL — ABNORMAL LOW (ref 3.5–5.0)
Alkaline Phosphatase: 123 U/L (ref 38–126)
Anion gap: 8 (ref 5–15)
BUN: 7 mg/dL (ref 6–20)
CO2: 22 mmol/L (ref 22–32)
Calcium: 8.9 mg/dL (ref 8.9–10.3)
Chloride: 104 mmol/L (ref 98–111)
Creatinine, Ser: 0.54 mg/dL (ref 0.44–1.00)
GFR, Estimated: >60 mL/min (ref >60)
Glucose, Bld: 126 mg/dL — ABNORMAL HIGH (ref 70–99)
Potassium: 3.7 mmol/L (ref 3.5–5.1)
Sodium: 134 mmol/L — ABNORMAL LOW (ref 135–145)
Total Bilirubin: 0.5 mg/dL (ref 0.3–1.2)
Total Protein: 6.4 g/dL — ABNORMAL LOW (ref 6.5–8.1)

## 2022-06-06 LAB — PROTEIN / CREATININE RATIO, URINE
Creatinine, Urine: 157 mg/dL
Protein Creatinine Ratio: 0.1 mg/mg{Cre} (ref 0.00–0.15)
Total Protein, Urine: 16 mg/dL

## 2022-06-06 MED ORDER — ACETAMINOPHEN 325 MG PO TABS: 650.0000 mg | ORAL_TABLET | ORAL | Status: DC | PRN

## 2022-06-06 NOTE — Progress Notes (Signed)
RN at bedside from 1705 to 1810 adjusting ultrasounds. Difficulty tracing twins due to fetal movement and fetal positioning. CNM performed bedside US. BPP order placed due to difficulty tracing.

## 2022-06-06 NOTE — Discharge Summary (Signed)
Isabel Higgins is a 27 y.o. female. She is at 17w2dgestation. Patient's last menstrual period was 11/10/2021. Estimated Date of Delivery: 08/20/22  [facility]  Current pregnancy complicated by:  - di/di twins - GDM A2 - Obesity - Hx of macrosomia - Hx of postpartum hemorrhage - Hx of gHTN  Chief complaint: elevated BP at home check and some lower extremity swelling. Reports BP at home 150/90s    S: Resting comfortably. no CTX, no VB.no LOF,  Active fetal movement. Denies: HA, visual changes, SOB, or RUQ/epigastric pain  Maternal Medical History:   Past Medical History:  Diagnosis Date   Bradycardia    Depression    GERD (gastroesophageal reflux disease)    Gestational diabetes    Hemorrhoids during pregnancy in third trimester    Menometrorrhagia 08/07/2016   Mental disorder    [place]    Psoriasis     Past Surgical History:  Procedure Laterality Date   INNER EAR SURGERY     right   LAPAROSCOPY N/A 11/19/2018   Procedure: LAPAROSCOPY DIAGNOSTIC AND BIOPSY;  Surgeon: HGae Dry MD;  Location: [facility] ORS;  Service: Gynecology;  Laterality: N/A;   SHOULDER SURGERY     right    Allergies  Allergen Reactions   Honeysuckle Flower [Lonicera] Swelling, Rash and Other (See Comments)    Other Reaction: BREAK OUT, FACIAL SWELLING, WE   Amoxicillin Nausea And Vomiting    Did it involve swelling of the face/tongue/throat, SOB, or low BP? No Did it involve sudden or severe rash/hives, skin peeling, or any reaction on the inside of your mouth or nose? No Did you need to seek medical attention at a hospital or doctor's office? No When did it last happen?     Can tolerate but gets an upset stomach  If all above answers are "NO", may proceed with cephalosporin use.     Prior to Admission medications   Medication Sig Start Date End Date Taking? Authorizing Provider  ACCU-CHEK GUIDE test strip USE 1 STRIP 4 TIMES A DAY AS  DIRECTED 03/19/22  Yes [provider]  Accu-Chek Softclix Lancets lancets SMARTSIG:2 Topical Twice Daily 03/05/22  Yes [provider]  aspirin EC 81 MG tablet Take 162 mg by mouth daily. Swallow whole.   Yes [provider]  Blood Glucose Monitoring Suppl (ACCU-CHEK GUIDE) w/Device KIT See admin instructions. 02/06/22  Yes [provider]  Insulin Pen Needle (PEN NEEDLES 31GX5/16") 31G X 8 MM MISC See admin instructions. 02/21/22 02/21/23 Yes [provider]  metformin (FORTAMET) 1000 MG (OSM) 24 hr tablet Take 1,000 mg by mouth at bedtime.   Yes [provider]  metFORMIN (GLUCOPHAGE) 500 MG tablet Take 500 mg by mouth. 500 mg am and 1000 mg pm 01/11/21  Yes [provider]  omeprazole (PRILOSEC) 40 MG capsule Take 40 mg by mouth daily.   Yes [provider]  Prenatal Vit-Fe Fumarate-FA (PRENATAL MULTIVITAMIN) TABS tablet Take 1 tablet by mouth daily at 12 noon.   Yes [provider]  venlafaxine XR (EFFEXOR-XR) 150 MG 24 hr capsule 150 mg daily with breakfast. 03/06/22  Yes [provider]  HUMIRA PEN 40 MG/0.4ML PNKT SMARTSIG:40 Milligram(s) SUB-Q Every 2 Weeks Patient not taking: Reported on 04/03/2022 11/06/20   [provider]  HUMULIN N KWIKPEN 100 UNIT/ML KwikPen Inject 48 Units into the skin in the morning. 28 AM 20 PM    [provider]  Insulin Regular Human (HUMULIN R) 100 UNIT/ML KwikPen Inject 34 Units into the skin daily. 16 AM 18 PM Pt says she does not use Pen but draws it up 02/21/22   [provider]    Social History: She  reports that she has never smoked. She has never used smokeless tobacco. She reports that she does not drink alcohol and does not use drugs.  Family History: family history includes Diabetes in her maternal grandfather, mother, and paternal grandfather; Heart attack in her maternal grandmother; Hypertension in her father.  no history of gyn  cancers  Review of Systems: A full review of systems was performed and negative except as noted in the HPI.     O:  BP 112/64   Pulse (!) 107   Resp 17   Ht '5\' 7"'$  (1.702 m)   Wt 126.6 kg   LMP 11/10/2021   BMI 43.70 kg/m   Vitals:   06/06/22 1652 06/06/22 1706 06/06/22 1721 06/06/22 1736  BP: 117/70 111/65 109/67 121/69   06/06/22 1752 06/06/22 1807 06/06/22 1822 06/06/22 1837  BP: 117/67 112/65 119/68 112/64    Results for orders placed or performed during the hospital encounter of 06/06/22 (from the past 48 hour(s))  Comprehensive metabolic panel   Collection Time: 06/06/22  4:54 PM  Result Value Ref Range   Sodium 134 (L) 135 - 145 mmol/L   Potassium 3.7 3.5 - 5.1 mmol/L   Chloride 104 98 - 111 mmol/L   CO2 22 22 - 32 mmol/L   Glucose, Bld 126 (H) 70 - 99 mg/dL   BUN 7 6 - 20 mg/dL   Creatinine, Ser 0.54 0.44 - 1.00 mg/dL   Calcium 8.9 8.9 - 10.3 mg/dL   Total Protein 6.4 (L) 6.5 - 8.1 g/dL   Albumin 2.8 (L) 3.5 - 5.0 g/dL   AST 17 15 - 41 U/L   ALT 12 0 - 44 U/L   Alkaline Phosphatase 123 38 - 126 U/L   Total Bilirubin 0.5 0.3 - 1.2 mg/dL   GFR, Estimated >60 >60 mL/min   Anion gap 8 5 - 15  CBC   Collection Time: 06/06/22  4:54 PM  Result Value Ref Range   WBC 12.9 (H) 4.0 - 10.5 K/uL   RBC 3.77 (L) 3.87 - 5.11 MIL/uL   Hemoglobin 10.3 (L) 12.0 - 15.0 g/dL   HCT 32.2 (L) 36.0 - 46.0 %   MCV 85.4 80.0 - 100.0 fL   MCH 27.3 26.0 - 34.0 pg   MCHC 32.0 30.0 - 36.0 g/dL   RDW 14.5 11.5 - 15.5 %   Platelets 208 150 - 400 K/uL   nRBC 0.0 0.0 - 0.2 %  Protein / creatinine ratio, urine   Collection Time: 06/06/22  4:59 PM  Result Value Ref Range   Creatinine, Urine 157 mg/dL   Total Protein, Urine 16 mg/dL   Protein Creatinine Ratio 0.10 0.00 - 0.15 mg/mg[Cre]     Constitutional: NAD, AAOx3  HE/ENT: extraocular movements grossly intact, moist mucous membranes CV: RRR PULM: nl respiratory effort, CTABL     Abd: gravid, non-tender, non-distended,  soft      Ext: Non-tender, Nonedematous   Psych: mood appropriate, speech normal Pelvic: deferred.  Fetal  monitoring: unable to monitor babies with external EFM, minimal tracing obtained. Active FM noted on bedside US.    Bedside US: Baby A breech; Baby B: transverse, head pointing to maternal left  BPP ordered to ensure fetal wellbeing.  A/P: 27 y.o. [redacted]w[redacted]d here for antenatal surveillance for low back pain  Principle Diagnosis:  Back pain in pregnancy,  - high risk pregnancy 3rd trimester,  - di/di twins,  - GDM A2  Preterm labor: not present.  Fetal Wellbeing: BPP 8/8 for both babies per [facility] report  All normotensive BP, normal labs.  P/C ratio:  0.10 D/c home stable, precautions reviewed, follow-up as scheduled.    RFrancetta Found CNM 06/06/2022 8:11 PM

## 2022-06-06 NOTE — Progress Notes (Signed)
Pt off unit for BPP in ultrasound

## 2022-06-06 NOTE — OB Triage Note (Signed)
Pt G3P2 63w2dwith twins presents for increased BP. Pt reports HA that resolved on its own. +2 reflexes. No clonus. Denies LOF, bleeding. +FM. VSS.

## 2022-06-17 ENCOUNTER — Observation Stay
Admission: EM | Admit: 2022-06-17 | Discharge: 2022-06-17 | Disposition: A | Discharge status: Home / Self Care | Payer: Managed Care, Other (non HMO) | Attending: Obstetrics and Gynecology | Admitting: Obstetrics and Gynecology

## 2022-06-17 ENCOUNTER — Encounter: Payer: Self-pay | Admitting: Obstetrics and Gynecology

## 2022-06-17 ENCOUNTER — Other Ambulatory Visit: Payer: Self-pay

## 2022-06-17 DIAGNOSIS — Z3490 Encounter for supervision of normal pregnancy, unspecified, unspecified trimester: Secondary | ICD-10-CM

## 2022-06-17 DIAGNOSIS — Z794 Long term (current) use of insulin: Secondary | ICD-10-CM | POA: Diagnosis not present

## 2022-06-17 DIAGNOSIS — O10913 Unspecified pre-existing hypertension complicating pregnancy, third trimester: Principal | ICD-10-CM | POA: Insufficient documentation

## 2022-06-17 DIAGNOSIS — O212 Late vomiting of pregnancy: Secondary | ICD-10-CM | POA: Diagnosis not present

## 2022-06-17 DIAGNOSIS — Z3A30 30 weeks gestation of pregnancy: Secondary | ICD-10-CM | POA: Diagnosis not present

## 2022-06-17 DIAGNOSIS — O24414 Gestational diabetes mellitus in pregnancy, insulin controlled: Secondary | ICD-10-CM | POA: Insufficient documentation

## 2022-06-17 DIAGNOSIS — Z7982 Long term (current) use of aspirin: Secondary | ICD-10-CM | POA: Insufficient documentation

## 2022-06-17 DIAGNOSIS — Z79899 Other long term (current) drug therapy: Secondary | ICD-10-CM | POA: Diagnosis not present

## 2022-06-17 DIAGNOSIS — Z7984 Long term (current) use of oral hypoglycemic drugs: Secondary | ICD-10-CM | POA: Insufficient documentation

## 2022-06-17 DIAGNOSIS — O30043 Twin pregnancy, dichorionic/diamniotic, third trimester: Secondary | ICD-10-CM | POA: Insufficient documentation

## 2022-06-17 LAB — CBC
HCT: 31 % — ABNORMAL LOW (ref 36.0–46.0)
Hemoglobin: 9.7 g/dL — ABNORMAL LOW (ref 12.0–15.0)
MCH: 26.4 pg (ref 26.0–34.0)
MCHC: 31.3 g/dL (ref 30.0–36.0)
MCV: 84.5 fL (ref 80.0–100.0)
Platelets: 180 10*3/uL (ref 150–400)
RBC: 3.67 MIL/uL — ABNORMAL LOW (ref 3.87–5.11)
RDW: 14.6 % (ref 11.5–15.5)
WBC: 9.3 10*3/uL (ref 4.0–10.5)
nRBC: 0.2 % (ref 0.0–0.2)

## 2022-06-17 LAB — COMPREHENSIVE METABOLIC PANEL
ALT: 12 U/L (ref 0–44)
AST: 18 U/L (ref 15–41)
Albumin: 2.4 g/dL — ABNORMAL LOW (ref 3.5–5.0)
Alkaline Phosphatase: 128 U/L — ABNORMAL HIGH (ref 38–126)
Anion gap: 11 (ref 5–15)
BUN: 6 mg/dL (ref 6–20)
CO2: 23 mmol/L (ref 22–32)
Calcium: 9.3 mg/dL (ref 8.9–10.3)
Chloride: 103 mmol/L (ref 98–111)
Creatinine, Ser: 0.54 mg/dL (ref 0.44–1.00)
GFR, Estimated: >60 mL/min (ref >60)
Glucose, Bld: 85 mg/dL (ref 70–99)
Potassium: 4.1 mmol/L (ref 3.5–5.1)
Sodium: 137 mmol/L (ref 135–145)
Total Bilirubin: 0.6 mg/dL (ref 0.3–1.2)
Total Protein: 5.9 g/dL — ABNORMAL LOW (ref 6.5–8.1)

## 2022-06-17 LAB — PROTEIN / CREATININE RATIO, URINE
Creatinine, Urine: 148 mg/dL
Protein Creatinine Ratio: 0.13 mg/mg{Cre} (ref 0.00–0.15)
Total Protein, Urine: 19 mg/dL

## 2022-06-17 NOTE — OB Triage Note (Signed)
Patient is a G3P2002 at [redacted]w[redacted]d who presents to unit c/o HA, N/V, elevated BP at home, and cramping intermittently. Pt reports home Bps to range from 180-140s/112-96. Reports spots in vision that have since resolved. Reports taking tylenol without relief at home. Reports +FM, denies LOF and vaginal bleeding. Vital signs WDL.

## 2022-06-17 NOTE — Discharge Summary (Signed)
Isabel Higgins is a 27 y.o. female. She is at [redacted]w[redacted]d gestation. Patient's last menstrual period was 11/10/2021. Estimated Date of Delivery: 08/20/22  Prenatal care site: [facility]  Chief complaint:   HPI: Isabel Higgins presents to L&D with complaints of HA, N/V, elevated BP at home and intermittent cramping  Factors complicating pregnancy: - di/di twins - GDM A2 - Obesity - Hx of macrosomia - Hx of postpartum hemorrhage - Hx of gHTN    S: Resting comfortably. no CTX, no VB.no LOF,  Active fetal movement.   Maternal Medical History:  Past Medical Hx:  has a past medical history of Bradycardia, Depression, GERD (gastroesophageal reflux disease), Gestational diabetes, Hemorrhoids during pregnancy in third trimester, Menometrorrhagia (08/07/2016), Mental disorder, Morbid obesity ([place]), and Psoriasis.    Past Surgical Hx:  has a past surgical history that includes Inner ear surgery; Shoulder surgery; and laparoscopy (N/A, 11/19/2018).   Allergies  Allergen Reactions   Honeysuckle Flower [Lonicera] Swelling, Rash and Other (See Comments)    Other Reaction: BREAK OUT, FACIAL SWELLING, WE   Amoxicillin Nausea And Vomiting    Did it involve swelling of the face/tongue/throat, SOB, or low BP? No Did it involve sudden or severe rash/hives, skin peeling, or any reaction on the inside of your mouth or nose? No Did you need to seek medical attention at a hospital or doctor's office? No When did it last happen?     Can tolerate but gets an upset stomach  If all above answers are "NO", may proceed with cephalosporin use.      Prior to Admission medications   Medication Sig Start Date End Date Taking? Authorizing Provider  ACCU-CHEK GUIDE test strip USE 1 STRIP 4 TIMES A DAY AS DIRECTED 03/19/22   [provider]  Accu-Chek Softclix Lancets lancets SMARTSIG:2 Topical Twice Daily 03/05/22   [provider]  aspirin EC 81 MG tablet Take 162 mg by mouth daily. Swallow  whole.    [provider]  Blood Glucose Monitoring Suppl (ACCU-CHEK GUIDE) w/Device KIT See admin instructions. 02/06/22   [provider]  HUMIRA PEN 40 MG/0.4ML PNKT SMARTSIG:40 Milligram(s) SUB-Q Every 2 Weeks Patient not taking: Reported on 04/03/2022 11/06/20   [provider]  HUMULIN N KWIKPEN 100 UNIT/ML KwikPen Inject 48 Units into the skin in the morning. 28 AM 20 PM    [provider]  Insulin Pen Needle (PEN NEEDLES 31GX5/16") 31G X 8 MM MISC See admin instructions. 02/21/22 02/21/23  [provider]  Insulin Regular Human (HUMULIN R) 100 UNIT/ML KwikPen Inject 34 Units into the skin daily. 16 AM 18 PM Pt says she does not use Pen but draws it up 02/21/22   [provider]  metformin (FORTAMET) 1000 MG (OSM) 24 hr tablet Take 1,000 mg by mouth at bedtime.    [provider]  metFORMIN (GLUCOPHAGE) 500 MG tablet Take 500 mg by mouth. 500 mg am and 1000 mg pm 01/11/21   [provider]  omeprazole (PRILOSEC) 40 MG capsule Take 40 mg by mouth daily.    [provider]  Prenatal Vit-Fe Fumarate-FA (PRENATAL MULTIVITAMIN) TABS tablet Take 1 tablet by mouth daily at 12 noon.    [provider]  venlafaxine XR (EFFEXOR-XR) 150 MG 24 hr capsule 150 mg daily with breakfast. 03/06/22   [provider]    Social History: She  reports that she has never smoked. She has never used smokeless tobacco. She reports that she does not  drink alcohol and does not use drugs.  Family History: family history includes Diabetes in her maternal grandfather, mother, and paternal grandfather; Heart attack in her maternal grandmother; Hypertension in her father. ,no history of gyn cancers  Review of Systems: A full review of systems was performed and negative except as noted in the HPI.    O:  BP 131/81   Pulse (!) 113   Temp 98.3 F (36.8 C) (Oral)   Resp 18   Ht 5\' 7"  (1.702 m)   Wt 127 kg   LMP 11/10/2021    BMI 43.85 kg/m  Results for orders placed or performed during the hospital encounter of 06/17/22 (from the past 48 hour(s))  Protein / creatinine ratio, urine   Collection Time: 06/17/22 10:26 AM  Result Value Ref Range   Creatinine, Urine 148 mg/dL   Total Protein, Urine 19 mg/dL   Protein Creatinine Ratio 0.13 0.00 - 0.15 mg/mg[Cre]  CBC   Collection Time: 06/17/22 10:36 AM  Result Value Ref Range   WBC 9.3 4.0 - 10.5 K/uL   RBC 3.67 (L) 3.87 - 5.11 MIL/uL   Hemoglobin 9.7 (L) 12.0 - 15.0 g/dL   HCT 31.0 (L) 36.0 - 46.0 %   MCV 84.5 80.0 - 100.0 fL   MCH 26.4 26.0 - 34.0 pg   MCHC 31.3 30.0 - 36.0 g/dL   RDW 14.6 11.5 - 15.5 %   Platelets 180 150 - 400 K/uL   nRBC 0.2 0.0 - 0.2 %  Comprehensive metabolic panel   Collection Time: 06/17/22 10:36 AM  Result Value Ref Range   Sodium 137 135 - 145 mmol/L   Potassium 4.1 3.5 - 5.1 mmol/L   Chloride 103 98 - 111 mmol/L   CO2 23 22 - 32 mmol/L   Glucose, Bld 85 70 - 99 mg/dL   BUN 6 6 - 20 mg/dL   Creatinine, Ser 0.54 0.44 - 1.00 mg/dL   Calcium 9.3 8.9 - 10.3 mg/dL   Total Protein 5.9 (L) 6.5 - 8.1 g/dL   Albumin 2.4 (L) 3.5 - 5.0 g/dL   AST 18 15 - 41 U/L   ALT 12 0 - 44 U/L   Alkaline Phosphatase 128 (H) 38 - 126 U/L   Total Bilirubin 0.6 0.3 - 1.2 mg/dL   GFR, Estimated >60 >60 mL/min   Anion gap 11 5 - 15     Constitutional: NAD, AAOx3  HE/ENT: extraocular movements grossly intact, moist mucous membranes CV: RRR PULM: nl respiratory effort, CTABL Abd: gravid, non-tender, non-distended, soft  Ext: Non-tender, Nonedmeatous Psych: mood appropriate, speech normal Pelvic : deferred SVE:     Fetal Monitor: Baseline: 145 bpm Variability: moderate Accels: Present Decels: variable, appropriate for gestational age 66: none  Category: II  Fetal Monitor: Baseline: 135 bpm Variability: moderate Accels: Present Decels: variable appropriate for gestational age Toco: none  Category: II  Assessment: 27 y.o.  [redacted]w[redacted]d here for antenatal surveillance during pregnancy.  Principle diagnosis:  There were no encounter diagnoses.   Plan: Labor: not present.  Fetal Wellbeing: Reassuring Cat 1 tracing. Reactive NST  PIH labs WNL D/c home stable, precautions reviewed, follow-up as scheduled.   ----- Avelino Leeds, CNM Certified Nurse Midwife [facility]

## 2022-06-17 NOTE — OB Triage Note (Signed)
Isabel Higgins presented to L&D triage at [redacted]w[redacted]d for headache, nausea, vomiting. Labs and fetal strip reviewed by Corlis Leak and discharge orders given. Pt educated on pain relief during pregnancy and signs of labor. Pt will return to office in 3 days for BP check and possible repeat labs. Discharge instructions reviewed and understood by pt.

## 2022-06-19 ENCOUNTER — Other Ambulatory Visit: Payer: Self-pay

## 2022-06-19 DIAGNOSIS — O30043 Twin pregnancy, dichorionic/diamniotic, third trimester: Secondary | ICD-10-CM

## 2022-06-19 DIAGNOSIS — O24414 Gestational diabetes mellitus in pregnancy, insulin controlled: Secondary | ICD-10-CM

## 2022-06-19 DIAGNOSIS — Z8759 Personal history of other complications of pregnancy, childbirth and the puerperium: Secondary | ICD-10-CM

## 2022-06-19 DIAGNOSIS — O99210 Obesity complicating pregnancy, unspecified trimester: Secondary | ICD-10-CM

## 2022-06-19 DIAGNOSIS — O24419 Gestational diabetes mellitus in pregnancy, unspecified control: Secondary | ICD-10-CM

## 2022-06-20 ENCOUNTER — Encounter: Payer: Self-pay | Admitting: Obstetrics and Gynecology

## 2022-06-20 ENCOUNTER — Observation Stay
Admission: EM | Admit: 2022-06-20 | Discharge: 2022-06-20 | Disposition: A | Discharge status: Home / Self Care | Payer: Managed Care, Other (non HMO) | Attending: Obstetrics and Gynecology | Admitting: Obstetrics and Gynecology

## 2022-06-20 ENCOUNTER — Other Ambulatory Visit: Payer: Self-pay

## 2022-06-20 ENCOUNTER — Other Ambulatory Visit: Payer: Self-pay | Admitting: Obstetrics

## 2022-06-20 DIAGNOSIS — O99013 Anemia complicating pregnancy, third trimester: Secondary | ICD-10-CM

## 2022-06-20 DIAGNOSIS — O30003 Twin pregnancy, unspecified number of placenta and unspecified number of amniotic sacs, third trimester: Secondary | ICD-10-CM | POA: Diagnosis not present

## 2022-06-20 DIAGNOSIS — Z3A31 31 weeks gestation of pregnancy: Secondary | ICD-10-CM | POA: Diagnosis not present

## 2022-06-20 DIAGNOSIS — O368330 Maternal care for abnormalities of the fetal heart rate or rhythm, third trimester, not applicable or unspecified: Secondary | ICD-10-CM | POA: Diagnosis present

## 2022-06-20 DIAGNOSIS — O368190 Decreased fetal movements, unspecified trimester, not applicable or unspecified: Secondary | ICD-10-CM | POA: Diagnosis present

## 2022-06-20 LAB — CBC WITH DIFFERENTIAL/PLATELET
Abs Immature Granulocytes: 0.12 10*3/uL — ABNORMAL HIGH (ref 0.00–0.07)
Basophils Absolute: 0 10*3/uL (ref 0.0–0.1)
Basophils Relative: 0 %
Eosinophils Absolute: 0.1 10*3/uL (ref 0.0–0.5)
Eosinophils Relative: 1 %
HCT: 31.3 % — ABNORMAL LOW (ref 36.0–46.0)
Hemoglobin: 9.8 g/dL — ABNORMAL LOW (ref 12.0–15.0)
Immature Granulocytes: 1 %
Lymphocytes Relative: 19 %
Lymphs Abs: 2 10*3/uL (ref 0.7–4.0)
MCH: 26.2 pg (ref 26.0–34.0)
MCHC: 31.3 g/dL (ref 30.0–36.0)
MCV: 83.7 fL (ref 80.0–100.0)
Monocytes Absolute: 0.6 10*3/uL (ref 0.1–1.0)
Monocytes Relative: 6 %
Neutro Abs: 7.6 10*3/uL (ref 1.7–7.7)
Neutrophils Relative %: 73 %
Platelets: 172 10*3/uL (ref 150–400)
RBC: 3.74 MIL/uL — ABNORMAL LOW (ref 3.87–5.11)
RDW: 14.6 % (ref 11.5–15.5)
WBC: 10.4 10*3/uL (ref 4.0–10.5)
nRBC: 0 % (ref 0.0–0.2)

## 2022-06-20 LAB — FERRITIN: Ferritin: 7 ng/mL — ABNORMAL LOW (ref 11–307)

## 2022-06-20 MED ORDER — SODIUM CHLORIDE 0.9 % IV SOLN
300.0000 mg | Freq: Once | INTRAVENOUS | Status: AC
  Administered 2022-06-20: 300 mg via INTRAVENOUS
  Filled 2022-06-20: qty 300

## 2022-06-20 NOTE — OB Triage Note (Signed)
Patient is a G3P2002 at [redacted]w[redacted]d who was sent over from the office for an NST and iron infusion. Reports +FM, denies ctx, LOF, and vaginal bleeding. External monitors applied and assessing. Baby A initial [place] 165. Baby B FHT 155. Vital signs WDL.

## 2022-06-20 NOTE — OB Triage Note (Signed)
Discharge instructions, labor precautions, and follow-up care reviewed with patient and significant other. All questions answered. Patient verbalized understanding. Discharged ambulatory off unit.   

## 2022-06-20 NOTE — Progress Notes (Signed)
Maternal iron deficiency anemia in third trimester. IV Venofer ordered x4 weekly  Avelino Leeds CNM

## 2022-06-20 NOTE — Discharge Summary (Signed)
Patient ID: Isabel Higgins MRN: [id number] DOB/AGE: [birth year] 27 y.o.  Admit date: 06/20/2022 Discharge date: 06/21/2022  Admission Diagnoses: 27yo G3P2 at [redacted]w[redacted]d with a twin pregnancy sent from the office for c/o DFM.   Discharge Diagnoses: RNST  Factors complicating pregnancy: Di/Di twins Gestational Diabetes: GDMA2 Obesity BMI 40 or more - Obesity Class III  Hx of macrosomia, pelvis proven to 9lb 13oz History of PPH with G1, received hemorrhage prophylaxis with G2 and did not hemorrhage History of gHTN with G1,  Taking Humira for psoriasis.   Prenatal Procedures: NST, Venofer infusion  Consults: None  Significant Diagnostic Studies:  Results for orders placed or performed during the hospital encounter of 06/20/22 (from the past 168 hour(s))  CBC with Differential/Platelet   Collection Time: 06/20/22 12:37 PM  Result Value Ref Range   WBC 10.4 4.0 - 10.5 K/uL   RBC 3.74 (L) 3.87 - 5.11 MIL/uL   Hemoglobin 9.8 (L) 12.0 - 15.0 g/dL   HCT 31.3 (L) 36.0 - 46.0 %   MCV 83.7 80.0 - 100.0 fL   MCH 26.2 26.0 - 34.0 pg   MCHC 31.3 30.0 - 36.0 g/dL   RDW 14.6 11.5 - 15.5 %   Platelets 172 150 - 400 K/uL   nRBC 0.0 0.0 - 0.2 %   Neutrophils Relative % 73 %   Neutro Abs 7.6 1.7 - 7.7 K/uL   Lymphocytes Relative 19 %   Lymphs Abs 2.0 0.7 - 4.0 K/uL   Monocytes Relative 6 %   Monocytes Absolute 0.6 0.1 - 1.0 K/uL   Eosinophils Relative 1 %   Eosinophils Absolute 0.1 0.0 - 0.5 K/uL   Basophils Relative 0 %   Basophils Absolute 0.0 0.0 - 0.1 K/uL   Immature Granulocytes 1 %   Abs Immature Granulocytes 0.12 (H) 0.00 - 0.07 K/uL  Ferritin   Collection Time: 06/20/22 12:37 PM  Result Value Ref Range   Ferritin 7 (L) 11 - 307 ng/mL  Results for orders placed or performed during the hospital encounter of 06/17/22 (from the past 168 hour(s))  Protein / creatinine ratio, urine   Collection Time: 06/17/22 10:26 AM  Result Value Ref Range   Creatinine, Urine 148 mg/dL   Total  Protein, Urine 19 mg/dL   Protein Creatinine Ratio 0.13 0.00 - 0.15 mg/mg[Cre]  CBC   Collection Time: 06/17/22 10:36 AM  Result Value Ref Range   WBC 9.3 4.0 - 10.5 K/uL   RBC 3.67 (L) 3.87 - 5.11 MIL/uL   Hemoglobin 9.7 (L) 12.0 - 15.0 g/dL   HCT 31.0 (L) 36.0 - 46.0 %   MCV 84.5 80.0 - 100.0 fL   MCH 26.4 26.0 - 34.0 pg   MCHC 31.3 30.0 - 36.0 g/dL   RDW 14.6 11.5 - 15.5 %   Platelets 180 150 - 400 K/uL   nRBC 0.2 0.0 - 0.2 %  Comprehensive metabolic panel   Collection Time: 06/17/22 10:36 AM  Result Value Ref Range   Sodium 137 135 - 145 mmol/L   Potassium 4.1 3.5 - 5.1 mmol/L   Chloride 103 98 - 111 mmol/L   CO2 23 22 - 32 mmol/L   Glucose, Bld 85 70 - 99 mg/dL   BUN 6 6 - 20 mg/dL   Creatinine, Ser 0.54 0.44 - 1.00 mg/dL   Calcium 9.3 8.9 - 10.3 mg/dL   Total Protein 5.9 (L) 6.5 - 8.1 g/dL   Albumin 2.4 (L) 3.5 - 5.0 g/dL   AST  18 15 - 41 U/L   ALT 12 0 - 44 U/L   Alkaline Phosphatase 128 (H) 38 - 126 U/L   Total Bilirubin 0.6 0.3 - 1.2 mg/dL   GFR, Estimated >60 >60 mL/min   Anion gap 11 5 - 15    Treatments: none  Hospital Course:  This is a 27 y.o. G3P2002 with IUP at [redacted]w[redacted]d seen for DFM.  No UCs, leaking of fluid and no bleeding.  Labs collected, showing severe anemia.  Venofer iron infusion given. NST x2 reactive. She was observed, fetal heart rate monitoring remained reassuring, and she had no signs/symptoms of labor or other maternal-fetal concerns.  She was deemed stable for discharge to home with outpatient follow up.  Discharge Physical Exam:  BP 114/69 (BP Location: Right Arm)   Pulse (!) 115   Temp 98 F (36.7 C) (Oral)   Resp 18   Ht 5\' 7"  (1.702 m)   Wt 127.9 kg   LMP 11/10/2021   BMI 44.17 kg/m   Vitals:   06/20/22 1203  BP: 114/69     General: NAD CV: RRR Pulm: nl effort ABD: s/nd/nt, gravid DVT Evaluation: LE non-ttp, no evidence of DVT on exam.  NST: FHR baseline: 155 bpm Variability: moderate Accelerations: yes Decelerations:  none Category/reactivity: reactive  FHR baseline: 135 bpm Variability: moderate Accelerations: yes Decelerations: none Category/reactivity: reactive  TOCO: quiet SVE: deferred      Discharge Condition: Stable  Disposition:  Discharge disposition: 01-Home or Self Care        Allergies as of 06/20/2022       Reactions   Honeysuckle Flower [lonicera] Swelling, Rash, Other (See Comments)   Other Reaction: BREAK OUT, FACIAL SWELLING, WE   Amoxicillin Nausea And Vomiting   Did it involve swelling of the face/tongue/throat, SOB, or low BP? No Did it involve sudden or severe rash/hives, skin peeling, or any reaction on the inside of your mouth or nose? No Did you need to seek medical attention at a hospital or doctor's office? No When did it last happen?     Can tolerate but gets an upset stomach  If all above answers are "NO", may proceed with cephalosporin use.        Medication List     ASK your doctor about these medications    Accu-Chek Guide test strip Generic drug: glucose blood USE 1 STRIP 4 TIMES A DAY AS DIRECTED   Accu-Chek Guide w/Device Kit See admin instructions.   Accu-Chek Softclix Lancets lancets SMARTSIG:2 Topical Twice Daily   aspirin EC 81 MG tablet Take 162 mg by mouth daily. Swallow whole.   Humira (2 Pen) 40 MG/0.4ML Pnkt Generic drug: Adalimumab SMARTSIG:40 Milligram(s) SUB-Q Every 2 Weeks   HumuLIN N KwikPen 100 UNIT/ML Kiwkpen Generic drug: Insulin NPH (Human) ([place]) Inject 48 Units into the skin in the morning. 28 AM 20 PM   Insulin Regular Human 100 UNIT/ML KwikPen Commonly known as: HUMULIN R Inject 34 Units into the skin daily. 16 AM 18 PM Pt says she does not use Pen but draws it up   metformin 1000 MG (OSM) 24 hr tablet Commonly known as: FORTAMET Take 1,000 mg by mouth at bedtime.   metFORMIN 500 MG tablet Commonly known as: GLUCOPHAGE Take 500 mg by mouth. 500 mg am and 1000 mg pm   omeprazole 40 MG  capsule Commonly known as: PRILOSEC Take 40 mg by mouth daily.   PEN NEEDLES 31GX5/16" 31G X 8 MM Misc See  admin instructions.   prenatal multivitamin Tabs tablet Take 1 tablet by mouth daily at 12 noon.   venlafaxine XR 150 MG 24 hr capsule Commonly known as: EFFEXOR-XR 150 mg daily with breakfast.         Signed:  Linda Hedges, CNM 06/21/2022 7:47 AM

## 2022-06-24 ENCOUNTER — Other Ambulatory Visit: Payer: Self-pay

## 2022-06-26 ENCOUNTER — Other Ambulatory Visit: Payer: Self-pay | Admitting: Obstetrics and Gynecology

## 2022-06-26 ENCOUNTER — Ambulatory Visit: Payer: Managed Care, Other (non HMO) | Attending: Maternal & Fetal Medicine

## 2022-06-26 ENCOUNTER — Inpatient Hospital Stay
Admission: RE | Admit: 2022-06-26 | Discharge: 2022-06-26 | Disposition: A | Discharge status: Home / Self Care | Payer: Managed Care, Other (non HMO) | Source: Ambulatory Visit

## 2022-06-26 ENCOUNTER — Ambulatory Visit: Payer: Managed Care, Other (non HMO)

## 2022-06-26 ENCOUNTER — Other Ambulatory Visit: Payer: Self-pay

## 2022-06-26 DIAGNOSIS — O24415 Gestational diabetes mellitus in pregnancy, controlled by oral hypoglycemic drugs: Secondary | ICD-10-CM | POA: Insufficient documentation

## 2022-06-26 DIAGNOSIS — O30043 Twin pregnancy, dichorionic/diamniotic, third trimester: Secondary | ICD-10-CM | POA: Insufficient documentation

## 2022-06-26 DIAGNOSIS — O09293 Supervision of pregnancy with other poor reproductive or obstetric history, third trimester: Secondary | ICD-10-CM | POA: Diagnosis not present

## 2022-06-26 DIAGNOSIS — O368330 Maternal care for abnormalities of the fetal heart rate or rhythm, third trimester, not applicable or unspecified: Secondary | ICD-10-CM | POA: Insufficient documentation

## 2022-06-26 DIAGNOSIS — Z79899 Other long term (current) drug therapy: Secondary | ICD-10-CM

## 2022-06-26 DIAGNOSIS — O24414 Gestational diabetes mellitus in pregnancy, insulin controlled: Secondary | ICD-10-CM

## 2022-06-26 DIAGNOSIS — Z3A32 32 weeks gestation of pregnancy: Secondary | ICD-10-CM | POA: Diagnosis not present

## 2022-06-26 DIAGNOSIS — E669 Obesity, unspecified: Secondary | ICD-10-CM | POA: Insufficient documentation

## 2022-06-26 DIAGNOSIS — O24419 Gestational diabetes mellitus in pregnancy, unspecified control: Secondary | ICD-10-CM

## 2022-06-26 DIAGNOSIS — O368332 Maternal care for abnormalities of the fetal heart rate or rhythm, third trimester, fetus 2: Secondary | ICD-10-CM | POA: Diagnosis not present

## 2022-06-26 DIAGNOSIS — O99210 Obesity complicating pregnancy, unspecified trimester: Secondary | ICD-10-CM

## 2022-06-26 DIAGNOSIS — O1493 Unspecified pre-eclampsia, third trimester: Secondary | ICD-10-CM | POA: Diagnosis not present

## 2022-06-26 DIAGNOSIS — O321XX0 Maternal care for breech presentation, not applicable or unspecified: Secondary | ICD-10-CM | POA: Insufficient documentation

## 2022-06-26 DIAGNOSIS — O99213 Obesity complicating pregnancy, third trimester: Secondary | ICD-10-CM

## 2022-06-26 DIAGNOSIS — Z8759 Personal history of other complications of pregnancy, childbirth and the puerperium: Secondary | ICD-10-CM

## 2022-06-26 NOTE — Procedures (Addendum)
Isabel Higgins 1996-02-11 [redacted]w[redacted]d   Fetus B Non-Stress Test Interpretation for 06/26/22  Indication: Gestational Diabetes medication controlled  Fetal Heart Rate Fetus B Mode: Doppler Baseline Rate (B): 160 BPM Variability: Moderate Accelerations: 15 x 15 Decelerations: None  Uterine Activity Mode: Toco  Interpretation (Baby B - Fetal Testing) Nonstress Test Interpretation (Baby B): Reactive (Dr. Epimenio Sarin interpreted NST) Comments (Baby B): Twin B  Isabel Higgins 1995/12/09 [redacted]w[redacted]d  Fetus A Non-Stress Test Interpretation for 06/26/22  Indication: Gestational Diabetes medication controlled  Fetal Heart Rate A Mode: Doppler Baseline Rate (A): 120 bpm Variability: Moderate Accelerations: 15 x 15 Decelerations: None Multiple birth?: Yes  Uterine Activity Mode: Toco  Interpretation (Fetal Testing) Nonstress Test Interpretation: Reactive (Dr. Epimenio Sarin interpreted NST) Comments: [name]

## 2022-06-26 NOTE — Progress Notes (Signed)
Patient called to inform staff of inability to keep appointment. Will call if there is a need to reschedule.

## 2022-06-27 ENCOUNTER — Other Ambulatory Visit: Payer: Managed Care, Other (non HMO)

## 2022-07-01 ENCOUNTER — Other Ambulatory Visit: Payer: Self-pay

## 2022-07-01 DIAGNOSIS — O99210 Obesity complicating pregnancy, unspecified trimester: Secondary | ICD-10-CM

## 2022-07-01 DIAGNOSIS — O24414 Gestational diabetes mellitus in pregnancy, insulin controlled: Secondary | ICD-10-CM

## 2022-07-01 DIAGNOSIS — O30043 Twin pregnancy, dichorionic/diamniotic, third trimester: Secondary | ICD-10-CM

## 2022-07-01 DIAGNOSIS — O24419 Gestational diabetes mellitus in pregnancy, unspecified control: Secondary | ICD-10-CM

## 2022-07-01 DIAGNOSIS — Z8759 Personal history of other complications of pregnancy, childbirth and the puerperium: Secondary | ICD-10-CM

## 2022-07-03 ENCOUNTER — Ambulatory Visit: Payer: Managed Care, Other (non HMO) | Attending: Maternal & Fetal Medicine

## 2022-07-03 ENCOUNTER — Other Ambulatory Visit: Payer: Self-pay

## 2022-07-03 DIAGNOSIS — O24415 Gestational diabetes mellitus in pregnancy, controlled by oral hypoglycemic drugs: Secondary | ICD-10-CM

## 2022-07-03 DIAGNOSIS — E669 Obesity, unspecified: Secondary | ICD-10-CM | POA: Diagnosis not present

## 2022-07-03 DIAGNOSIS — O30043 Twin pregnancy, dichorionic/diamniotic, third trimester: Secondary | ICD-10-CM | POA: Diagnosis present

## 2022-07-03 DIAGNOSIS — O99210 Obesity complicating pregnancy, unspecified trimester: Secondary | ICD-10-CM

## 2022-07-03 DIAGNOSIS — O99213 Obesity complicating pregnancy, third trimester: Secondary | ICD-10-CM | POA: Diagnosis not present

## 2022-07-03 DIAGNOSIS — I499 Cardiac arrhythmia, unspecified: Secondary | ICD-10-CM | POA: Diagnosis not present

## 2022-07-03 DIAGNOSIS — O24414 Gestational diabetes mellitus in pregnancy, insulin controlled: Secondary | ICD-10-CM

## 2022-07-03 DIAGNOSIS — Z8759 Personal history of other complications of pregnancy, childbirth and the puerperium: Secondary | ICD-10-CM

## 2022-07-03 DIAGNOSIS — O09293 Supervision of pregnancy with other poor reproductive or obstetric history, third trimester: Secondary | ICD-10-CM | POA: Diagnosis not present

## 2022-07-03 DIAGNOSIS — O24419 Gestational diabetes mellitus in pregnancy, unspecified control: Secondary | ICD-10-CM | POA: Diagnosis not present

## 2022-07-03 DIAGNOSIS — O99413 Diseases of the circulatory system complicating pregnancy, third trimester: Secondary | ICD-10-CM | POA: Diagnosis not present

## 2022-07-03 DIAGNOSIS — Z79899 Other long term (current) drug therapy: Secondary | ICD-10-CM

## 2022-07-03 DIAGNOSIS — Z3A33 33 weeks gestation of pregnancy: Secondary | ICD-10-CM | POA: Insufficient documentation

## 2022-07-04 ENCOUNTER — Encounter: Payer: Self-pay | Admitting: Obstetrics and Gynecology

## 2022-07-04 ENCOUNTER — Observation Stay
Admission: EM | Admit: 2022-07-04 | Discharge: 2022-07-04 | Disposition: A | Discharge status: Home / Self Care | Payer: Managed Care, Other (non HMO) | Attending: Obstetrics | Admitting: Obstetrics

## 2022-07-04 ENCOUNTER — Other Ambulatory Visit: Payer: Self-pay

## 2022-07-04 DIAGNOSIS — O24414 Gestational diabetes mellitus in pregnancy, insulin controlled: Secondary | ICD-10-CM | POA: Diagnosis not present

## 2022-07-04 DIAGNOSIS — O30043 Twin pregnancy, dichorionic/diamniotic, third trimester: Secondary | ICD-10-CM | POA: Diagnosis not present

## 2022-07-04 DIAGNOSIS — Z7984 Long term (current) use of oral hypoglycemic drugs: Secondary | ICD-10-CM | POA: Insufficient documentation

## 2022-07-04 DIAGNOSIS — Z79899 Other long term (current) drug therapy: Secondary | ICD-10-CM | POA: Insufficient documentation

## 2022-07-04 DIAGNOSIS — Z7982 Long term (current) use of aspirin: Secondary | ICD-10-CM | POA: Diagnosis not present

## 2022-07-04 DIAGNOSIS — Z794 Long term (current) use of insulin: Secondary | ICD-10-CM | POA: Diagnosis not present

## 2022-07-04 DIAGNOSIS — O368330 Maternal care for abnormalities of the fetal heart rate or rhythm, third trimester, not applicable or unspecified: Secondary | ICD-10-CM | POA: Diagnosis present

## 2022-07-04 DIAGNOSIS — Z3A33 33 weeks gestation of pregnancy: Secondary | ICD-10-CM | POA: Diagnosis not present

## 2022-07-04 DIAGNOSIS — O368390 Maternal care for abnormalities of the fetal heart rate or rhythm, unspecified trimester, not applicable or unspecified: Secondary | ICD-10-CM | POA: Diagnosis present

## 2022-07-04 NOTE — Discharge Summary (Signed)
Isabel Higgins is a 27 y.o. female. She is at [redacted]w[redacted]d gestation. Patient's last menstrual period was 11/10/2021. Estimated Date of Delivery: 08/20/22  Prenatal care site: [facility]  Chief complaint: fetal tachycardia on NST  HPI: Isabel Higgins presents to L&D with complaints of fetal tachycardia on NST  Factors complicating pregnancy: [place] Twins GDM A2 Obesity Hx of macrosomia Hx of PPH with G1 Hx of GHTN Fetal tachycardia baby B  S: Resting comfortably. no CTX, no VB.no LOF,  Active fetal movement.   Maternal Medical History:  Past Medical Hx:  has a past medical history of Bradycardia, Depression, GERD (gastroesophageal reflux disease), Gestational diabetes, Hemorrhoids during pregnancy in third trimester, Menometrorrhagia (08/07/2016), Mental disorder, Morbid obesity, Pelvic pain (07/14/2018), and Psoriasis.    Past Surgical Hx:  has a past surgical history that includes Inner ear surgery; Shoulder surgery; and laparoscopy (N/A, 11/19/2018).   Allergies  Allergen Reactions   Honeysuckle Flower [Lonicera] Swelling, Rash and Other (See Comments)    Other Reaction: BREAK OUT, FACIAL SWELLING, WE   Amoxicillin Nausea And Vomiting    Did it involve swelling of the face/tongue/throat, SOB, or low BP? No Did it involve sudden or severe rash/hives, skin peeling, or any reaction on the inside of your mouth or nose? No Did you need to seek medical attention at a hospital or doctor's office? No When did it last happen?     Can tolerate but gets an upset stomach  If all above answers are "NO", may proceed with cephalosporin use.      Prior to Admission medications   Medication Sig Start Date End Date Taking? Authorizing Provider  aspirin EC 81 MG tablet Take 162 mg by mouth daily. Swallow whole.   Yes [provider]  metformin (FORTAMET) 1000 MG (OSM) 24 hr tablet Take 1,000 mg by mouth at bedtime.   Yes [provider]  omeprazole (PRILOSEC) 40 MG capsule  Take 40 mg by mouth daily.   Yes [provider]  ondansetron (ZOFRAN) 8 MG tablet Take 8 mg by mouth every 8 (eight) hours as needed for nausea or vomiting.   Yes [provider]  Prenatal Vit-Fe Fumarate-FA (PRENATAL MULTIVITAMIN) TABS tablet Take 1 tablet by mouth daily at 12 noon.   Yes [provider]  venlafaxine XR (EFFEXOR-XR) 150 MG 24 hr capsule 150 mg daily with breakfast. 03/06/22  Yes [provider]  ACCU-CHEK GUIDE test strip USE 1 STRIP 4 TIMES A DAY AS DIRECTED 03/19/22   [provider]  Accu-Chek Softclix Lancets lancets SMARTSIG:2 Topical Twice Daily 03/05/22   [provider]  Blood Glucose Monitoring Suppl (ACCU-CHEK GUIDE) w/Device KIT See admin instructions. 02/06/22   [provider]  HUMIRA PEN 40 MG/0.4ML PNKT SMARTSIG:40 Milligram(s) SUB-Q Every 2 Weeks Patient not taking: Reported on 04/03/2022 11/06/20   [provider]  HUMULIN N KWIKPEN 100 UNIT/ML KwikPen Inject 58 Units into the skin in the morning. 33 AM AM 25 PM    [provider]  Insulin Pen Needle (PEN NEEDLES 31GX5/16") 31G X 8 MM MISC See admin instructions. 02/21/22 02/21/23  [provider]  Insulin Regular Human (HUMULIN R) 100 UNIT/ML KwikPen Inject 34 Units into the skin daily. 16 AM 18 PM Pt says she does not use Pen but draws it up 02/21/22   [provider]  metFORMIN (GLUCOPHAGE) 500 MG tablet Take 500 mg by mouth. 500 mg am and 1000 mg pm 01/11/21   [provider]    Social History: She  reports that she has never smoked. She has never used smokeless tobacco. She reports that she does not drink alcohol and does not use drugs.  Family History: family history includes Diabetes in her maternal grandfather, mother, and paternal grandfather; Heart attack in her maternal grandmother; Hypertension in her father. ,no history of gyn cancers  Review of Systems: A full review of systems was performed  and negative except as noted in the HPI.    O:  BP 119/70 (BP Location: Left Arm)   Pulse (!) 117   Temp 98.5 F (36.9 C) (Oral)   Resp 15   Ht 5\' 7"  (1.702 m)   Wt 130.2 kg   LMP 11/10/2021   BMI 44.95 kg/m  No results found for this or any previous visit (from the past 48 hour(s)).   Constitutional: NAD, AAOx3  HE/ENT: extraocular movements grossly intact, moist mucous membranes CV: RRR PULM: nl respiratory effort, CTABL Abd: gravid, non-tender, non-distended, soft  Ext: Non-tender, Nonedmeatous Psych: mood appropriate, speech normal Pelvic : deferred SVE:     Fetal Monitor: Baby A Baseline: 145 bpm Variability: moderate Accels: Present Decels: none Toco: none  Category: I  Fetal Monitor: Baby B Baseline: 150 bpm Variability: moderate Accels: Present Decels: none Toco: none  Category: I   Assessment: 27 y.o. [redacted]w[redacted]d here for antenatal surveillance during pregnancy.  Principle diagnosis: fetal tachycardia There were no encounter diagnoses.   Plan: Labor: not present.  Fetal Wellbeing: Reassuring Cat 1 tracing. Reactive NST  D/c home stable, precautions reviewed, follow-up as scheduled.   ----- Chari Manning, CNM Certified Nurse Midwife [facility]

## 2022-07-08 ENCOUNTER — Other Ambulatory Visit: Payer: Self-pay

## 2022-07-08 DIAGNOSIS — O30043 Twin pregnancy, dichorionic/diamniotic, third trimester: Secondary | ICD-10-CM

## 2022-07-08 DIAGNOSIS — O99210 Obesity complicating pregnancy, unspecified trimester: Secondary | ICD-10-CM

## 2022-07-08 DIAGNOSIS — O0990 Supervision of high risk pregnancy, unspecified, unspecified trimester: Secondary | ICD-10-CM

## 2022-07-08 DIAGNOSIS — O24419 Gestational diabetes mellitus in pregnancy, unspecified control: Secondary | ICD-10-CM

## 2022-07-08 DIAGNOSIS — O09891 Supervision of other high risk pregnancies, first trimester: Secondary | ICD-10-CM

## 2022-07-09 ENCOUNTER — Other Ambulatory Visit: Payer: Self-pay | Admitting: Obstetrics and Gynecology

## 2022-07-09 DIAGNOSIS — Z01818 Encounter for other preprocedural examination: Secondary | ICD-10-CM

## 2022-07-09 NOTE — H&P (Signed)
Isabel Higgins is a 27 y.o. female presenting for elective primary LTCS and BTL .IDDM A2GDm insulin and didi twins . Elects for c/s . MFM supports delivery at 37+2 weeks on 07/29/22   26 y.o. G3P2002 at  Patient's Last Menstrual Period was 11/10/21. consistent with ultrasound @ [redacted]w[redacted]d. Estimated Date of Delivery: 08/17/22. Sex of babies and names:  "Walker & Willow "   Partner:  Joselyn Glassman    Factors complicating this pregnancy  Di/Di twins Multi-fetal gestation - Di/Di twins Genetic Screening: referred to MFM- seen 02/13/22 Baby B with tachycardia   ASA starting at 12 weeks (d/c 35-37 weeks) taking 02/06/22 Growth [place] with MFM 06/26/22 Twice weekly NST with AFI beginning at 32 weeks Delivery: [redacted]w[redacted]d - [redacted]w[redacted]d, MFM Grace Bushy ) is OK with delivery at 37 + weeks ( 07/04/22) Delivery scheduled for:   Gestational Diabetes: GDMA2 01/10/22: 1hr GTT 193 at NOB (on metformin)  01/31/22: early 3hr GTT passed F:105,176,143,108 (but on Metformin) 02/06/22: reviewed with SDJ: GTT taken while on Metformin  qAM,  qPM: Dx GDMA2 qAM- 24 units NPH, 12 units Reg, qPM- 9 units NPH, 9 units Reg per TJS 02/21/22 - as of 12/8 has not started 03/29/22: qAM- 26 units NPH, 14 units Reg, qPM- 12 units NPH, 16 units Reg per TJS 04/18/22: AM: NPH 28 units/Reg 16 units; PM: NPH 20 units/Reg 18 units 05/2022: AM: NPH 33 units/reg 16 units; PM: NPH 25 units/reg 18 units (per SJ) 07/04/22 AM : 35 N + 20 R     PM: 30 N +20R Lifestyles scheduled 02/26/22 supplies and glucometer ordered 02/06/22  Obesity BMI 40 or more - Obesity Class III  BMI at NOB 41 Baseline labs: Early 1 hour GTT: 193 Early 3hr GTT: see GDM problem P/C ratio:  70 CMP: WNL A1c: 5.7 TSH:  MFM referral sent (for di/di twins) 06/26/22: concordant growth , B: fetal tachycardia without etiology   Anesthesia consult - refer in 3rd trimester Antenatal testing per di/di twins plan  Hx of macrosomia, pelvis proven to 9lb 13oz Antenatal testing per di/di  twin plan  History of PPH with G1, received hemorrhage prophylaxis with G2 and did not hemorrhage  History of gHTN with G1,  Labs per obesity plan start  aspirin at 12-16wks  Taking Humira for psoriasis.  Per UpToDate and ACOG, ok in 1st and 2nd trimester, stop in 3rd trimester d/t progressively increased concentrations in fetus throughout pregnancy  Screening results and needs: NOB:  Medicaid Questionnaire:   ACHD Program Depression Score: MBT:A+   Ab screen: Neg  HIV: Neg  RPR:NR    Hep B:Neg  Hep C:NR  Pap:03/22/21 neg. G/C: Neg/Neg Rubella:Immune    VZV: Immune TSH:4.413  HgA1C: 5.7 Aneuploidy:  First trimester:  MaternitT21:02/13/22:    Second trimester (AFP/tetra):  28 weeks:  Review Medicaid Questionnaire:  ACHD Program Depression Score:13 Blood consent: signed 06/27/22 AC Hgb: 12.1  Platelets: 185  Glucola: 107  Rhogam: N/A 36 weeks:  GBS:   G/C:   Hgb:  Platelets:    HIV: RPR:    Last [place]:  01/09/22: Di/Di Twin Gestation, Fetus A:Uterus anteverted, Viable IUP, S=[redacted]w[redacted]d FHR=175bpm Cervical length=4.70cm Yolk sac and amnion seen No free fluid seen Rt corpus luteum  cyst=1.7cm Lt simple ovarian cyst=3.1cm Fetus B:Viable IUP, S=[redacted]w[redacted]d FHR=172bpm Yolk sac and amnion seen 02/13/22: anatomy at [facility] 02/28/22: Di/Di Twin Gestation, Fetus A: FHT=144 BPM. BPD=2.86 cm =[redacted]w[redacted]d. Pres:transverse head maternal right. Previa: marginal, Plac Loc: Posterior. AFI=within normal limits.  Twin B: FHT=153 BPM. Pres: variable Placental Location:posterior, Previa: marginal. AFI=within normal limits. BPD=2.94 cm [redacted]w[redacted]d. Cervix appears closed measuring 3.5 cm. 05/29/22: (Fetus A) Fetal Heart Rate(bpm):  151  Cardiac Activity: Observed  Fetal Lie:  Lower Fetus  Presentation: Frank breech  Placenta: Posterior  P. Cord Insertion: Previously visualized   Membrane Desc: Dividing Membrane seen - Dichorionic.  AFI FV: WNL  Largest Pocket(cm): 5.94     (Fetus B) Fetal Heart Rate(bpm): 168  Cardiac  Activity: Observed  Fetal Lie: Upper Fetus   Presentation: Transverse, head to maternal right  Placenta: Posterior Fundal  P. Cord Insertion: Previously visualized  Membrane Desc: Dividing Membrane seen - Dichorionic.  AFI FV: WNL    Largest Pocket(cm): 3.91 . Post fund placenta, [id number] g %, AFI- wnl. Fetus B-transverse, head to mat left    06/26/22: Fetus A-breech, post place, ZOX-0960 g %, AFI-WNL.Marland Kitchen Fetus B-transverse head to mat left, post fundal plac, EFW-1915 g %, AFI-upper normal       Immunization:   Flu in season -  Tdap at 27-36 weeks - given [facility] ([employer] student) 06/27/22 Covid-19 -  Contraception Plan: BTL consent signed4/4/24 Feeding Plan:  Labor Plans:      OB History     Gravida  3   Para  2   Term  2   Preterm  0   AB  0   Living  2      SAB      IAB      Ectopic      Multiple  0   Live Births  2          Past Medical History:  Diagnosis Date   Bradycardia    Depression    GERD (gastroesophageal reflux disease)    Gestational diabetes    Hemorrhoids during pregnancy in third trimester    Menometrorrhagia 08/07/2016   Mental disorder    Morbid obesity    Pelvic pain 07/14/2018   Psoriasis    Past Surgical History:  Procedure Laterality Date   INNER EAR SURGERY     right   LAPAROSCOPY N/A 11/19/2018   Procedure: LAPAROSCOPY DIAGNOSTIC AND BIOPSY;  Surgeon: Nadara Mustard, MD;  Location: [facility] ORS;  Service: Gynecology;  Laterality: N/A;   SHOULDER SURGERY     right   Family History: family history includes Diabetes in her maternal grandfather, mother, and paternal grandfather; Heart attack in her maternal grandmother; Hypertension in her father. Social History:  reports that she has never smoked. She has never used smokeless tobacco. She reports that she does not drink alcohol and does not use drugs.      Review of Systems History   Blood pressure 119/70, pulse (!) 117, temperature 98.5 F (36.9 C), temperature source Oral,  resp. rate 15, height  (1.702 m), weight 130.2 kg, last menstrual period 11/10/2021, currently breastfeeding. Exam 07/09/22 Physical Exam  Lungs CTA   CV RRR   Abd  gravid  Prenatal labs: ABO, Rh: --/--/A POS Performed at [facility], [address] [place], [place] [address]  (12/07 1537) Antibody:  Neg Rubella:  Imm, VZ : Imm RPR:   NR HBsAg:   neg HIV:   neg GBS:   pending  Assessment/Plan: DiDI twins with A2GDM , suboptimal control diabetes  Elective Primary LTCS with BTL   The risks of cesarean section discussed with the patient included but were not limited to: bleeding which may require transfusion or reoperation; infection which  may require antibiotics; injury to bowel, bladder, ureters or other surrounding organs; injury to the fetus; need for additional procedures including hysterectomy in the event of a life-threatening hemorrhage; placental abnormalities wth subsequent pregnancies, incisional problems, thromboembolic phenomenon and other postoperative/anesthesia complications. The patient concurred with the proposed plan, giving informed written consent for the procedure.   .  Preoperative prophylactic antibiotics and SCDs ordered on call to the OR.  To OR when ready.  Ihor Austin Tziporah Knoke 07/09/2022, 9:53 AM

## 2022-07-10 ENCOUNTER — Other Ambulatory Visit: Payer: Self-pay

## 2022-07-10 ENCOUNTER — Ambulatory Visit: Payer: Managed Care, Other (non HMO) | Attending: Obstetrics

## 2022-07-10 ENCOUNTER — Other Ambulatory Visit: Payer: Self-pay | Admitting: Maternal & Fetal Medicine

## 2022-07-10 ENCOUNTER — Ambulatory Visit (HOSPITAL_BASED_OUTPATIENT_CLINIC_OR_DEPARTMENT_OTHER): Payer: Managed Care, Other (non HMO) | Admitting: Obstetrics

## 2022-07-10 VITALS — BP 133/82 | HR 120 | Temp 97.6°F | Ht 67.0 in | Wt 288.5 lb

## 2022-07-10 DIAGNOSIS — O99413 Diseases of the circulatory system complicating pregnancy, third trimester: Secondary | ICD-10-CM

## 2022-07-10 DIAGNOSIS — O99210 Obesity complicating pregnancy, unspecified trimester: Secondary | ICD-10-CM

## 2022-07-10 DIAGNOSIS — O24419 Gestational diabetes mellitus in pregnancy, unspecified control: Secondary | ICD-10-CM

## 2022-07-10 DIAGNOSIS — O30043 Twin pregnancy, dichorionic/diamniotic, third trimester: Secondary | ICD-10-CM | POA: Diagnosis not present

## 2022-07-10 DIAGNOSIS — O09891 Supervision of other high risk pregnancies, first trimester: Secondary | ICD-10-CM | POA: Diagnosis not present

## 2022-07-10 DIAGNOSIS — O09293 Supervision of pregnancy with other poor reproductive or obstetric history, third trimester: Secondary | ICD-10-CM

## 2022-07-10 DIAGNOSIS — Z3A34 34 weeks gestation of pregnancy: Secondary | ICD-10-CM

## 2022-07-10 DIAGNOSIS — I499 Cardiac arrhythmia, unspecified: Secondary | ICD-10-CM | POA: Diagnosis not present

## 2022-07-10 DIAGNOSIS — Z8759 Personal history of other complications of pregnancy, childbirth and the puerperium: Secondary | ICD-10-CM

## 2022-07-10 DIAGNOSIS — O24414 Gestational diabetes mellitus in pregnancy, insulin controlled: Secondary | ICD-10-CM

## 2022-07-10 DIAGNOSIS — O0990 Supervision of high risk pregnancy, unspecified, unspecified trimester: Secondary | ICD-10-CM

## 2022-07-10 DIAGNOSIS — O99213 Obesity complicating pregnancy, third trimester: Secondary | ICD-10-CM

## 2022-07-10 DIAGNOSIS — E669 Obesity, unspecified: Secondary | ICD-10-CM

## 2022-07-10 NOTE — Progress Notes (Signed)
MFM Note  Isabel Higgins is currently at 34 weeks and 1 day.  She was seen for a BPP due to a dichorionic, diamniotic twin gestation and gestational diabetes treated with insulin and metformin.    She is currently treated with NPH insulin 37 units in the morning and 35 units in the evening along with regular insulin 22 units in the morning and 22 units in the evening along with metformin 500 mg in the AM and 1000 mg in the PM.  She has required increasing doses of insulin to help her achieve better glycemic control.  Her insulin dose was just increased yesterday.  She reports that her fingerstick values have been in the high 80s to low 90s range and her 2-hour postprandial fingerstick values have been in the 120s to 130s range.  Her hemoglobin A1c drawn yesterday was 6.2%.  She is undergoing twice-weekly fetal testing.  A BPP performed today was 8 out of 8 for both [name] and twin B.    There was normal amniotic fluid noted around both fetuses today.    Both fetuses are in the breech/breech presentations.  Due to a dichorionic twin gestation with gestational diabetes that is difficult to control, she is already scheduled for a cesarean delivery on Jul 29, 2022 (at around 37 weeks).  The patient was advised that delivery at around 37 weeks is reasonable as long as her fetal testing remains reassuring.    Delivery prior to 37 weeks will be recommended should she go into the spontaneous labor, rupture membranes, or at any time for nonreassuring fetal status.  She should continue twice-weekly fetal testing until delivery.  The patient will have a BPP performed in our office one day each week and will have an NST performed in your office on another day.    Her insulin dose should continue to be adjusted in order to help her achieve the best possible glycemic control.  She will return to our office in 1 week for another BPP.    The patient stated that all of her questions were answered.  A  total of 20 minutes was spent counseling and coordinating the care for this patient.  Greater than 50% of the time was spent in direct face-to-face contact.

## 2022-07-11 ENCOUNTER — Other Ambulatory Visit: Payer: Managed Care, Other (non HMO)

## 2022-07-12 ENCOUNTER — Other Ambulatory Visit: Payer: Self-pay

## 2022-07-12 ENCOUNTER — Emergency Department
Admission: EM | Admit: 2022-07-12 | Discharge: 2022-07-12 | Discharge status: Absent without leave | Payer: Managed Care, Other (non HMO)

## 2022-07-12 ENCOUNTER — Observation Stay
Admission: EM | Admit: 2022-07-12 | Discharge: 2022-07-12 | Disposition: A | Discharge status: Home / Self Care | Payer: Managed Care, Other (non HMO) | Source: Ambulatory Visit | Attending: Obstetrics and Gynecology | Admitting: Obstetrics and Gynecology

## 2022-07-12 ENCOUNTER — Encounter: Payer: Self-pay | Admitting: Obstetrics and Gynecology

## 2022-07-12 DIAGNOSIS — O24419 Gestational diabetes mellitus in pregnancy, unspecified control: Secondary | ICD-10-CM | POA: Diagnosis not present

## 2022-07-12 DIAGNOSIS — O30043 Twin pregnancy, dichorionic/diamniotic, third trimester: Secondary | ICD-10-CM | POA: Insufficient documentation

## 2022-07-12 DIAGNOSIS — Z3689 Encounter for other specified antenatal screening: Secondary | ICD-10-CM | POA: Diagnosis present

## 2022-07-12 DIAGNOSIS — Z3A34 34 weeks gestation of pregnancy: Secondary | ICD-10-CM | POA: Diagnosis not present

## 2022-07-12 DIAGNOSIS — O30049 Twin pregnancy, dichorionic/diamniotic, unspecified trimester: Secondary | ICD-10-CM | POA: Diagnosis present

## 2022-07-12 NOTE — OB Triage Note (Signed)
[id number] presents to L&D at [redacted]w[redacted]d for an NST. NST was reactive and pt denies vaginal bleeding, rupture of membranes, or leaking of fluids. Discharge instructions reviewed and c-section date set. Pt leaving stable.

## 2022-07-12 NOTE — Discharge Summary (Signed)
Isabel Higgins is a 27 year old G3P2002 at [redacted]w[redacted]d here for planned NST for di/di twins.  Problems this pregnancy: - di/di twins - GDM A2 - Obesity - History of macrosomia - History of PPH with G1 - History of gHTN with G1 - Taking Humira for psoriasis   She reports good fetal movement. Denies contractions.   Today's Vitals   07/12/22 1639 07/12/22 1640  BP:  119/69  Pulse:  (!) 120  Resp: 16   Temp: 98.4 F (36.9 C)   PainSc: 0-No pain    There is no height or weight on file to calculate BMI.  NST: Baby A: Baseline 135bpm, moderate variability, accelerations present, no decelerations. Time: Category: I Reactive NST  Baby B: Baseline: 150bpm, moderate variability, accelerations present, no decelerations Time: Category I Reactive NST  Patient discharged home. Return with any concerns. Do daily fkc and monitor for s/s preterm labor.  Janyce Llanos, CNM 07/12/2022 5:05 PM

## 2022-07-12 NOTE — OB Triage Note (Signed)
NST for twins 

## 2022-07-15 ENCOUNTER — Other Ambulatory Visit: Payer: Self-pay

## 2022-07-15 DIAGNOSIS — O24414 Gestational diabetes mellitus in pregnancy, insulin controlled: Secondary | ICD-10-CM

## 2022-07-15 DIAGNOSIS — O30043 Twin pregnancy, dichorionic/diamniotic, third trimester: Secondary | ICD-10-CM

## 2022-07-15 DIAGNOSIS — O99213 Obesity complicating pregnancy, third trimester: Secondary | ICD-10-CM

## 2022-07-15 DIAGNOSIS — O0990 Supervision of high risk pregnancy, unspecified, unspecified trimester: Secondary | ICD-10-CM

## 2022-07-17 ENCOUNTER — Ambulatory Visit (HOSPITAL_BASED_OUTPATIENT_CLINIC_OR_DEPARTMENT_OTHER): Payer: Managed Care, Other (non HMO)

## 2022-07-17 ENCOUNTER — Other Ambulatory Visit: Payer: Self-pay

## 2022-07-17 ENCOUNTER — Encounter
Admission: RE | Admit: 2022-07-17 | Discharge: 2022-07-17 | Disposition: A | Discharge status: Home / Self Care | Payer: Managed Care, Other (non HMO) | Source: Ambulatory Visit | Attending: Anesthesiology | Admitting: Anesthesiology

## 2022-07-17 DIAGNOSIS — O09293 Supervision of pregnancy with other poor reproductive or obstetric history, third trimester: Secondary | ICD-10-CM | POA: Insufficient documentation

## 2022-07-17 DIAGNOSIS — Z3A35 35 weeks gestation of pregnancy: Secondary | ICD-10-CM | POA: Insufficient documentation

## 2022-07-17 DIAGNOSIS — O99413 Diseases of the circulatory system complicating pregnancy, third trimester: Secondary | ICD-10-CM

## 2022-07-17 DIAGNOSIS — O99213 Obesity complicating pregnancy, third trimester: Secondary | ICD-10-CM | POA: Insufficient documentation

## 2022-07-17 DIAGNOSIS — O30043 Twin pregnancy, dichorionic/diamniotic, third trimester: Secondary | ICD-10-CM | POA: Diagnosis not present

## 2022-07-17 DIAGNOSIS — E669 Obesity, unspecified: Secondary | ICD-10-CM

## 2022-07-17 DIAGNOSIS — O24415 Gestational diabetes mellitus in pregnancy, controlled by oral hypoglycemic drugs: Secondary | ICD-10-CM | POA: Diagnosis not present

## 2022-07-17 DIAGNOSIS — O24414 Gestational diabetes mellitus in pregnancy, insulin controlled: Secondary | ICD-10-CM

## 2022-07-17 DIAGNOSIS — Z8759 Personal history of other complications of pregnancy, childbirth and the puerperium: Secondary | ICD-10-CM

## 2022-07-17 DIAGNOSIS — I499 Cardiac arrhythmia, unspecified: Secondary | ICD-10-CM

## 2022-07-17 DIAGNOSIS — O0993 Supervision of high risk pregnancy, unspecified, third trimester: Secondary | ICD-10-CM | POA: Insufficient documentation

## 2022-07-17 DIAGNOSIS — O0990 Supervision of high risk pregnancy, unspecified, unspecified trimester: Secondary | ICD-10-CM

## 2022-07-17 NOTE — Progress Notes (Signed)
[facility] Anesthesia Consultation  AME HEAGLE ZOX:[id number] DOB: 1995/08/20 DOA: 07/17/2022 PCP: Alm Bustard, NP   Requesting physician: Dr. Feliberto Gottron Date of consultation: 07/17/2022 Reason for consultation: Obesity during pregnancy  CHIEF COMPLAINT:  Obesity during pregnancy  HISTORY OF PRESENT ILLNESS: Isabel Higgins  is a 27 y.o. female with a known history of gestational diabetes and morbid obesity presenting for airway evaluation prior to cesarean delivery.  PAST MEDICAL HISTORY:   Past Medical History:  Diagnosis Date   Bradycardia    Depression    GERD (gastroesophageal reflux disease)    Gestational diabetes    Hemorrhoids during pregnancy in third trimester    Menometrorrhagia 08/07/2016   Mental disorder    Morbid obesity    Pelvic pain 07/14/2018   Psoriasis     PAST SURGICAL HISTORY:  Past Surgical History:  Procedure Laterality Date   INNER EAR SURGERY     right   LAPAROSCOPY N/A 11/19/2018   Procedure: LAPAROSCOPY DIAGNOSTIC AND BIOPSY;  Surgeon: Nadara Mustard, MD;  Location: [facility] ORS;  Service: Gynecology;  Laterality: N/A;   SHOULDER SURGERY     right    SOCIAL HISTORY:  Social History   Tobacco Use   Smoking status: Never   Smokeless tobacco: Never  Substance Use Topics   Alcohol use: No    Alcohol/week: 0.0 standard drinks of alcohol    FAMILY HISTORY:  Family History  Problem Relation Age of Onset   Diabetes Mother    Hypertension Father    Heart attack Maternal Grandmother        x5   Diabetes Maternal Grandfather    Diabetes Paternal Grandfather     DRUG ALLERGIES:  Allergies  Allergen Reactions   Honeysuckle Flower [Lonicera] Swelling, Rash and Other (See Comments)    Other Reaction: BREAK OUT, FACIAL SWELLING, WE   Amoxicillin Nausea And Vomiting    Did it involve swelling of the face/tongue/throat, SOB, or low BP? No Did it involve sudden or severe rash/hives, skin  peeling, or any reaction on the inside of your mouth or nose? No Did you need to seek medical attention at a hospital or doctor's office? No When did it last happen?     Can tolerate but gets an upset stomach  If all above answers are "NO", may proceed with cephalosporin use.     REVIEW OF SYSTEMS:   RESPIRATORY: No cough, shortness of breath, wheezing.  CARDIOVASCULAR: No chest pain, orthopnea, edema.  HEMATOLOGY: No anemia, easy bruising or bleeding SKIN: No rash or lesion. NEUROLOGIC: No tingling, numbness, weakness.  PSYCHIATRY: No anxiety or depression.   MEDICATIONS AT HOME:  Prior to Admission medications   Medication Sig Start Date End Date Taking? Authorizing Provider  ACCU-CHEK GUIDE test strip USE 1 STRIP 4 TIMES A DAY AS DIRECTED 03/19/22   [provider]  Accu-Chek Softclix Lancets lancets SMARTSIG:2 Topical Twice Daily 03/05/22   [provider]  aspirin EC 81 MG tablet Take 162 mg by mouth daily. Swallow whole.    [provider]  Blood Glucose Monitoring Suppl (ACCU-CHEK GUIDE) w/Device KIT See admin instructions. 02/06/22   [provider]  HUMULIN N KWIKPEN 100 UNIT/ML KwikPen Inject 74 Units into the skin in the morning. 39 AM AM 35 PM    [provider]  Insulin Pen Needle (PEN NEEDLES 31GX5/16") 31G X 8 MM MISC See admin instructions. 02/21/22 02/21/23  [provider]  Insulin Regular Human (HUMULIN R)  100 UNIT/ML KwikPen Inject 57 Units into the skin daily. 22 AM 25 PM 10 @ lunch also as of 4/23 Pt says she does not use Pen but draws it up 02/21/22   [provider]  metformin (FORTAMET) 1000 MG (OSM) 24 hr tablet Take 1,000 mg by mouth at bedtime.    [provider]  metFORMIN (GLUCOPHAGE) 500 MG tablet Take 500 mg by mouth. 500 mg am and 1000 mg pm 01/11/21   [provider]  omeprazole (PRILOSEC) 40 MG capsule Take 40 mg by mouth daily.    [provider]  ondansetron  (ZOFRAN) 8 MG tablet Take 8 mg by mouth every 8 (eight) hours as needed for nausea or vomiting.    [provider]  Prenatal Vit-Fe Fumarate-FA (PRENATAL MULTIVITAMIN) TABS tablet Take 1 tablet by mouth daily at 12 noon.    [provider]  venlafaxine XR (EFFEXOR-XR) 150 MG 24 hr capsule 150 mg daily with breakfast. 03/06/22   [provider]      PHYSICAL EXAMINATION:   VITAL SIGNS: Last menstrual period 11/10/2021, currently breastfeeding.  GENERAL:  27 y.o.-year-old patient no acute distress.  HEENT: Head atraumatic, normocephalic. Oropharynx and nasopharynx clear. MP 1, TM distance >3 cm, normal mouth opening. LUNGS: No use of accessory muscles of respiration.   EXTREMITIES: No pedal edema, cyanosis, or clubbing.  NEUROLOGIC: normal gait PSYCHIATRIC: The patient is alert and oriented x 3.  SKIN: No obvious rash, lesion, or ulcer.    IMPRESSION AND PLAN:   Isabel Higgins  is a 27 y.o. female presenting with obesity during pregnancy. BMI is currently 45 at 35 weeks and 4 days gestation.   We discussed analgesic options during labor including epidural analgesia. Discussed that in obesity there can be increased difficulty with epidural placement or even failure of successful epidural. We also discussed that even after successful epidural placement there is increased risk of catheter migration out of the epidural space that would require catheter replacement. Discussed use of epidural vs spinal vs GA if cesarean delivery is required. Discussed increased risk of difficult intubation during pregnancy should an emergency cesarean delivery be required.  Patient is cleared for cesarean delivery at this time.  Thank you for the consult.

## 2022-07-19 ENCOUNTER — Observation Stay
Admission: RE | Admit: 2022-07-19 | Discharge: 2022-07-19 | Disposition: A | Discharge status: Home / Self Care | Payer: Managed Care, Other (non HMO) | Source: Ambulatory Visit | Attending: Obstetrics | Admitting: Obstetrics

## 2022-07-19 DIAGNOSIS — O30043 Twin pregnancy, dichorionic/diamniotic, third trimester: Secondary | ICD-10-CM | POA: Insufficient documentation

## 2022-07-19 DIAGNOSIS — Z3A35 35 weeks gestation of pregnancy: Secondary | ICD-10-CM | POA: Insufficient documentation

## 2022-07-19 DIAGNOSIS — Z3490 Encounter for supervision of normal pregnancy, unspecified, unspecified trimester: Secondary | ICD-10-CM

## 2022-07-19 NOTE — OB Triage Note (Signed)

## 2022-07-19 NOTE — Progress Notes (Signed)
   07/19/22 1307  Fetal Heart Rate A  Mode External  Baseline Rate (A) 130 bpm  Variability 6-25 BPM  Accelerations 15 x 15  Decelerations None  Fetal Heart Rate Fetus B  Mode External  Baseline Rate (B) 140 BPM  Variability 6-25 BPM  Accelerations 15 x 15  Decelerations None  Uterine Activity  Mode Toco  Contraction Frequency (min) UI    Category 1 NST

## 2022-07-19 NOTE — Discharge Summary (Signed)
Isabel Higgins is a 27 y.o. female. She is at [redacted]w[redacted]d gestation. Patient's last menstrual period was 11/10/2021. Estimated Date of Delivery: 08/20/22  [facility]renatal care site: [facility]  Chief complaint:fetal monitoring for Di/Di twins  HPI: Bellami presents to L&D with complaints of fetal monitoring for Di/Di twins  Factors complicating pregnancy:   di/di twins  GDM A2  Obesity  History of macrosomia  History of PPH with G1  History of gHTN with G1 Taking Humira for psoriasis   S: Resting comfortably. no CTX, no VB.no LOF,  Active fetal movement.   Maternal Medical History:  Past Medical Hx:  has a past medical history of Bradycardia, Depression, GERD (gastroesophageal reflux disease), Gestational diabetes, Hemorrhoids during pregnancy in third trimester, Menometrorrhagia (08/07/2016), Mental disorder, Morbid obesity (HCC), Pelvic pain (07/14/2018), and Psoriasis.    Past Surgical Hx:  has a past surgical history that includes Inner ear surgery; Shoulder surgery; and laparoscopy (N/A, 11/19/2018).   Allergies  Allergen Reactions   Honeysuckle Flower [Lonicera] Swelling, Rash and Other (See Comments)    Other Reaction: BREAK OUT, FACIAL SWELLING, WE   Amoxicillin Nausea And Vomiting    Did it involve swelling of the face/tongue/throat, SOB, or low BP? No Did it involve sudden or severe rash/hives, skin peeling, or any reaction on the inside of your mouth or nose? No Did you need to seek medical attention at a hospital or doctor's office? No When did it last happen?     Can tolerate but gets an upset stomach  If all above answers are "NO", may proceed with cephalosporin use.      Prior to Admission medications   Medication Sig Start Date End Date Taking? Authorizing Provider  ACCU-CHEK GUIDE test strip USE 1 STRIP 4 TIMES A DAY AS DIRECTED 03/19/22   [provider]  Accu-Chek Softclix Lancets lancets SMARTSIG:2 Topical Twice Daily 03/05/22   [provider]  aspirin EC 81 MG tablet Take 162 mg by mouth daily. Swallow whole.    [provider]  Blood Glucose Monitoring Suppl (ACCU-CHEK GUIDE) w/Device KIT See admin instructions. 02/06/22   [provider]  HUMULIN N KWIKPEN 100 UNIT/ML KwikPen Inject 74 Units into the skin in the morning. 39 AM AM 35 PM    [provider]  Insulin Pen Needle (PEN NEEDLES 31GX5/16") 31G X 8 MM MISC See admin instructions. 02/21/22 02/21/23  [provider]  Insulin Regular Human (HUMULIN R) 100 UNIT/ML KwikPen Inject 57 Units into the skin daily. 22 AM 25 PM 10 @ lunch also as of 4/23 Pt says she does not use Pen but draws it up 02/21/22   [provider]  metformin (FORTAMET) 1000 MG (OSM) 24 hr tablet Take 1,000 mg by mouth at bedtime.    [provider]  metFORMIN (GLUCOPHAGE) 500 MG tablet Take 500 mg by mouth. 500 mg am and 1000 mg pm 01/11/21   [provider]  omeprazole (PRILOSEC) 40 MG capsule Take 40 mg by mouth daily.    [provider]  ondansetron (ZOFRAN) 8 MG tablet Take 8 mg by mouth every 8 (eight) hours as needed for nausea or vomiting.    [provider]  Prenatal Vit-Fe Fumarate-FA (PRENATAL MULTIVITAMIN) TABS tablet Take 1 tablet by mouth daily at 12 noon.    [provider]  venlafaxine XR (EFFEXOR-XR) 150 MG 24 hr capsule 150 mg daily with breakfast. 03/06/22   [provider]    Social History:  She  reports that she has never smoked. She has never used smokeless tobacco. She reports that she does not drink alcohol and does not use drugs.  Family History: family history includes Diabetes in her maternal grandfather, mother, and paternal grandfather; Heart attack in her maternal grandmother; Hypertension in her father. ,no history of gyn cancers  Review of Systems: A full review of systems was performed and negative except as noted in the HPI.    O:  LMP 11/10/2021  No results  found for this or any previous visit (from the past 48 hour(s)).   Constitutional: NAD, AAOx3  HE/ENT: extraocular movements grossly intact, moist mucous membranes CV: RRR PULM: nl respiratory effort, CTABL Abd: gravid, non-tender, non-distended, soft  Ext: Non-tender, Nonedmeatous Psych: mood appropriate, speech normal Pelvic : deferred SVE:     Fetal Monitor:Baby A Baseline: 130 bpm Variability: moderate Accels: Present Decels: none Toco: none  Category: I  Fetal Monitor:Baby B Baseline: 140 bpm Variability: moderate Accels: Present Decels: none Toco: none  Category: I   Assessment: 27 y.o. [redacted]w[redacted]d here for antenatal surveillance during pregnancy.  Principle diagnosis: fetal surveillance  There were no encounter diagnoses.   Plan: Labor: not present.  Fetal Wellbeing: Reassuring Cat 1 tracing. Reactive NST  D/c home stable, precautions reviewed, follow-up as scheduled.   ----- Chari Manning, CNM Certified Nurse Midwife [facility]

## 2022-07-20 ENCOUNTER — Inpatient Hospital Stay: Payer: Managed Care, Other (non HMO) | Admitting: Registered Nurse

## 2022-07-20 ENCOUNTER — Other Ambulatory Visit: Payer: Self-pay

## 2022-07-20 ENCOUNTER — Inpatient Hospital Stay
Admission: EM | Admit: 2022-07-20 | Discharge: 2022-07-23 | DRG: 785 | Disposition: A | Discharge status: Home / Self Care | Payer: Managed Care, Other (non HMO) | Attending: Obstetrics | Admitting: Obstetrics

## 2022-07-20 ENCOUNTER — Encounter
Admission: EM | Disposition: A | Discharge status: Home / Self Care | Payer: Self-pay | Source: Home / Self Care | Attending: Obstetrics

## 2022-07-20 ENCOUNTER — Encounter: Payer: Self-pay | Admitting: Obstetrics and Gynecology

## 2022-07-20 DIAGNOSIS — O321XX1 Maternal care for breech presentation, fetus 1: Secondary | ICD-10-CM | POA: Diagnosis present

## 2022-07-20 DIAGNOSIS — Z3A35 35 weeks gestation of pregnancy: Secondary | ICD-10-CM

## 2022-07-20 DIAGNOSIS — Z8249 Family history of ischemic heart disease and other diseases of the circulatory system: Secondary | ICD-10-CM | POA: Diagnosis not present

## 2022-07-20 DIAGNOSIS — R519 Headache, unspecified: Principal | ICD-10-CM | POA: Diagnosis present

## 2022-07-20 DIAGNOSIS — O26893 Other specified pregnancy related conditions, third trimester: Secondary | ICD-10-CM | POA: Diagnosis present

## 2022-07-20 DIAGNOSIS — L409 Psoriasis, unspecified: Secondary | ICD-10-CM | POA: Diagnosis present

## 2022-07-20 DIAGNOSIS — O24424 Gestational diabetes mellitus in childbirth, insulin controlled: Secondary | ICD-10-CM | POA: Diagnosis present

## 2022-07-20 DIAGNOSIS — O30043 Twin pregnancy, dichorionic/diamniotic, third trimester: Secondary | ICD-10-CM | POA: Diagnosis present

## 2022-07-20 DIAGNOSIS — O9972 Diseases of the skin and subcutaneous tissue complicating childbirth: Secondary | ICD-10-CM | POA: Diagnosis present

## 2022-07-20 DIAGNOSIS — Z302 Encounter for sterilization: Secondary | ICD-10-CM | POA: Diagnosis not present

## 2022-07-20 DIAGNOSIS — Z23 Encounter for immunization: Secondary | ICD-10-CM

## 2022-07-20 DIAGNOSIS — O99214 Obesity complicating childbirth: Secondary | ICD-10-CM | POA: Diagnosis present

## 2022-07-20 DIAGNOSIS — O9902 Anemia complicating childbirth: Secondary | ICD-10-CM | POA: Diagnosis present

## 2022-07-20 LAB — COMPREHENSIVE METABOLIC PANEL
ALT: 15 U/L (ref 0–44)
AST: 21 U/L (ref 15–41)
Albumin: 2.5 g/dL — ABNORMAL LOW (ref 3.5–5.0)
Alkaline Phosphatase: 200 U/L — ABNORMAL HIGH (ref 38–126)
Anion gap: 8 (ref 5–15)
BUN: 10 mg/dL (ref 6–20)
CO2: 22 mmol/L (ref 22–32)
Calcium: 8.8 mg/dL — ABNORMAL LOW (ref 8.9–10.3)
Chloride: 106 mmol/L (ref 98–111)
Creatinine, Ser: 0.75 mg/dL (ref 0.44–1.00)
GFR, Estimated: >60 mL/min (ref >60)
Glucose, Bld: 70 mg/dL (ref 70–99)
Potassium: 3.8 mmol/L (ref 3.5–5.1)
Sodium: 136 mmol/L (ref 135–145)
Total Bilirubin: 0.5 mg/dL (ref 0.3–1.2)
Total Protein: 5.9 g/dL — ABNORMAL LOW (ref 6.5–8.1)

## 2022-07-20 LAB — PROTEIN / CREATININE RATIO, URINE
Creatinine, Urine: 214 mg/dL
Protein Creatinine Ratio: 0.17 mg/mg{Cre} — ABNORMAL HIGH (ref 0.00–0.15)
Total Protein, Urine: 36 mg/dL

## 2022-07-20 LAB — TYPE AND SCREEN
ABO/RH(D): A POS
Antibody Screen: NEGATIVE

## 2022-07-20 LAB — CBC
HCT: 33.6 % — ABNORMAL LOW (ref 36.0–46.0)
Hemoglobin: 10.4 g/dL — ABNORMAL LOW (ref 12.0–15.0)
MCH: 25.5 pg — ABNORMAL LOW (ref 26.0–34.0)
MCHC: 31 g/dL (ref 30.0–36.0)
MCV: 82.4 fL (ref 80.0–100.0)
Platelets: 163 10*3/uL (ref 150–400)
RBC: 4.08 MIL/uL (ref 3.87–5.11)
RDW: 17.4 % — ABNORMAL HIGH (ref 11.5–15.5)
WBC: 8.8 10*3/uL (ref 4.0–10.5)
nRBC: 0.3 % — ABNORMAL HIGH (ref 0.0–0.2)

## 2022-07-20 SURGERY — Surgical Case
Anesthesia: Spinal | Laterality: Bilateral

## 2022-07-20 MED ORDER — KETOROLAC TROMETHAMINE 30 MG/ML IJ SOLN
INTRAMUSCULAR | Status: DC | PRN
  Administered 2022-07-20: 30 mg via INTRAVENOUS

## 2022-07-20 MED ORDER — LACTATED RINGERS IV SOLN: INTRAVENOUS | Status: DC

## 2022-07-20 MED ORDER — MEASLES, MUMPS & RUBELLA VAC IJ SOLR
0.5000 mL | Freq: Once | INTRAMUSCULAR | Status: DC
  Filled 2022-07-20: qty 0.5

## 2022-07-20 MED ORDER — FENTANYL CITRATE (PF) 100 MCG/2ML IJ SOLN
INTRAMUSCULAR | Status: DC | PRN
  Administered 2022-07-20: 15 ug via INTRATHECAL

## 2022-07-20 MED ORDER — ZOLPIDEM TARTRATE 5 MG PO TABS: 5.0000 mg | ORAL_TABLET | Freq: Every evening | ORAL | Status: DC | PRN

## 2022-07-20 MED ORDER — CALCIUM CARBONATE ANTACID 500 MG PO CHEW: 2.0000 | CHEWABLE_TABLET | ORAL | Status: DC | PRN

## 2022-07-20 MED ORDER — OXYCODONE HCL 5 MG PO TABS
5.0000 mg | ORAL_TABLET | Freq: Four times a day (QID) | ORAL | Status: DC | PRN
  Administered 2022-07-20: 5 mg via ORAL
  Filled 2022-07-20: qty 1

## 2022-07-20 MED ORDER — SOD CITRATE-CITRIC ACID 500-334 MG/5ML PO SOLN: 30.0000 mL | ORAL | Status: DC | PRN

## 2022-07-20 MED ORDER — SIMETHICONE 80 MG PO CHEW
80.0000 mg | CHEWABLE_TABLET | ORAL | Status: DC | PRN
  Administered 2022-07-22: 80 mg via ORAL

## 2022-07-20 MED ORDER — BUPIVACAINE IN DEXTROSE 0.75-8.25 % IT SOLN
INTRATHECAL | Status: DC | PRN
  Administered 2022-07-20: 1.6 mL via INTRATHECAL

## 2022-07-20 MED ORDER — OXYTOCIN-SODIUM CHLORIDE 30-0.9 UT/500ML-% IV SOLN
2.5000 [IU]/h | INTRAVENOUS | Status: AC
  Filled 2022-07-20: qty 500

## 2022-07-20 MED ORDER — COCONUT OIL OIL: 1.0000 | TOPICAL_OIL | Status: DC | PRN

## 2022-07-20 MED ORDER — BISACODYL 10 MG RE SUPP: 10.0000 mg | Freq: Every day | RECTAL | Status: DC | PRN

## 2022-07-20 MED ORDER — SOD CITRATE-CITRIC ACID 500-334 MG/5ML PO SOLN
30.0000 mL | ORAL | Status: AC
  Administered 2022-07-20: 30 mL via ORAL

## 2022-07-20 MED ORDER — OXYTOCIN-SODIUM CHLORIDE 30-0.9 UT/500ML-% IV SOLN: 2.5000 [IU]/h | INTRAVENOUS | Status: DC

## 2022-07-20 MED ORDER — FERROUS SULFATE 325 (65 FE) MG PO TABS
325.0000 mg | ORAL_TABLET | Freq: Two times a day (BID) | ORAL | Status: DC
  Administered 2022-07-21 – 2022-07-23 (×4): 325 mg via ORAL
  Filled 2022-07-20 (×4): qty 1

## 2022-07-20 MED ORDER — SCOPOLAMINE 1 MG/3DAYS TD PT72
1.0000 | MEDICATED_PATCH | Freq: Once | TRANSDERMAL | Status: AC
  Administered 2022-07-20: 1.5 mg via TRANSDERMAL
  Filled 2022-07-20: qty 1

## 2022-07-20 MED ORDER — SODIUM CHLORIDE 0.9% FLUSH
INTRAVENOUS | Status: DC | PRN
  Administered 2022-07-20: 20 mL via INTRAVENOUS

## 2022-07-20 MED ORDER — OXYTOCIN-SODIUM CHLORIDE 30-0.9 UT/500ML-% IV SOLN
INTRAVENOUS | Status: AC
  Filled 2022-07-20: qty 500

## 2022-07-20 MED ORDER — BUPIVACAINE HCL 0.5 % IJ SOLN
INTRAMUSCULAR | Status: DC | PRN
  Administered 2022-07-20: 30 mL

## 2022-07-20 MED ORDER — KETOROLAC TROMETHAMINE 30 MG/ML IJ SOLN
30.0000 mg | Freq: Four times a day (QID) | INTRAMUSCULAR | Status: DC
  Administered 2022-07-20 – 2022-07-21 (×2): 30 mg via INTRAVENOUS
  Filled 2022-07-20 (×2): qty 1

## 2022-07-20 MED ORDER — ENOXAPARIN SODIUM 40 MG/0.4ML IJ SOSY
40.0000 mg | PREFILLED_SYRINGE | INTRAMUSCULAR | Status: DC
  Administered 2022-07-21 – 2022-07-23 (×3): 40 mg via SUBCUTANEOUS
  Filled 2022-07-20 (×3): qty 0.4

## 2022-07-20 MED ORDER — OXYCODONE HCL 5 MG PO TABS
5.0000 mg | ORAL_TABLET | ORAL | Status: DC | PRN
  Administered 2022-07-21: 5 mg via ORAL
  Filled 2022-07-20: qty 1

## 2022-07-20 MED ORDER — DIPHENHYDRAMINE HCL 25 MG PO CAPS: 25.0000 mg | ORAL_CAPSULE | Freq: Four times a day (QID) | ORAL | Status: DC | PRN

## 2022-07-20 MED ORDER — FLEET ENEMA 7-19 GM/118ML RE ENEM: 1.0000 | ENEMA | Freq: Every day | RECTAL | Status: DC | PRN

## 2022-07-20 MED ORDER — LACTATED RINGERS IV SOLN: 500.0000 mL | INTRAVENOUS | Status: DC | PRN

## 2022-07-20 MED ORDER — DIPHENHYDRAMINE HCL 50 MG/ML IJ SOLN: 12.5000 mg | INTRAMUSCULAR | Status: DC | PRN

## 2022-07-20 MED ORDER — KETOROLAC TROMETHAMINE 30 MG/ML IJ SOLN: 30.0000 mg | Freq: Four times a day (QID) | INTRAMUSCULAR | Status: AC | PRN

## 2022-07-20 MED ORDER — ONDANSETRON HCL 4 MG/2ML IJ SOLN
4.0000 mg | Freq: Four times a day (QID) | INTRAMUSCULAR | Status: DC | PRN
  Administered 2022-07-20: 4 mg via INTRAVENOUS

## 2022-07-20 MED ORDER — ONDANSETRON HCL 4 MG/2ML IJ SOLN
INTRAMUSCULAR | Status: AC
  Filled 2022-07-20: qty 2

## 2022-07-20 MED ORDER — NALOXONE HCL 4 MG/10ML IJ SOLN: 1.0000 ug/kg/h | INTRAVENOUS | Status: DC | PRN

## 2022-07-20 MED ORDER — SIMETHICONE 80 MG PO CHEW
80.0000 mg | CHEWABLE_TABLET | Freq: Three times a day (TID) | ORAL | Status: DC
  Administered 2022-07-21 – 2022-07-23 (×5): 80 mg via ORAL
  Filled 2022-07-20 (×6): qty 1

## 2022-07-20 MED ORDER — SODIUM CHLORIDE 0.9 % IV SOLN
500.0000 mg | INTRAVENOUS | Status: AC
  Administered 2022-07-20: 500 mg via INTRAVENOUS
  Filled 2022-07-20: qty 5

## 2022-07-20 MED ORDER — OXYTOCIN-SODIUM CHLORIDE 30-0.9 UT/500ML-% IV SOLN
INTRAVENOUS | Status: DC | PRN
  Administered 2022-07-20: 30 [IU] via INTRAVENOUS

## 2022-07-20 MED ORDER — ONDANSETRON HCL 4 MG/2ML IJ SOLN: 4.0000 mg | Freq: Three times a day (TID) | INTRAMUSCULAR | Status: DC | PRN

## 2022-07-20 MED ORDER — LIDOCAINE HCL (PF) 1 % IJ SOLN: 30.0000 mL | INTRAMUSCULAR | Status: DC | PRN

## 2022-07-20 MED ORDER — SENNOSIDES-DOCUSATE SODIUM 8.6-50 MG PO TABS
2.0000 | ORAL_TABLET | ORAL | Status: DC
  Administered 2022-07-20 – 2022-07-22 (×3): 2 via ORAL
  Filled 2022-07-20 (×3): qty 2

## 2022-07-20 MED ORDER — LACTATED RINGERS IV SOLN
125.0000 mL/h | INTRAVENOUS | Status: DC
  Administered 2022-07-20: 125 mL/h via INTRAVENOUS

## 2022-07-20 MED ORDER — ACETAMINOPHEN 325 MG PO TABS
650.0000 mg | ORAL_TABLET | ORAL | Status: DC | PRN
  Filled 2022-07-20: qty 2

## 2022-07-20 MED ORDER — MENTHOL 3 MG MT LOZG: 1.0000 | LOZENGE | OROMUCOSAL | Status: DC | PRN

## 2022-07-20 MED ORDER — VENLAFAXINE HCL ER 150 MG PO CP24
150.0000 mg | ORAL_CAPSULE | Freq: Every day | ORAL | Status: DC
  Administered 2022-07-21 – 2022-07-23 (×3): 150 mg via ORAL
  Filled 2022-07-20 (×3): qty 1

## 2022-07-20 MED ORDER — PRENATAL MULTIVITAMIN CH
1.0000 | ORAL_TABLET | Freq: Every day | ORAL | Status: DC
  Administered 2022-07-21 – 2022-07-23 (×3): 1 via ORAL
  Filled 2022-07-20 (×3): qty 1

## 2022-07-20 MED ORDER — KETOROLAC TROMETHAMINE 30 MG/ML IJ SOLN
INTRAMUSCULAR | Status: AC
  Filled 2022-07-20: qty 1

## 2022-07-20 MED ORDER — OXYTOCIN BOLUS FROM INFUSION: 333.0000 mL | Freq: Once | INTRAVENOUS | Status: DC

## 2022-07-20 MED ORDER — PRENATAL MULTIVITAMIN CH: 1.0000 | ORAL_TABLET | Freq: Every day | ORAL | Status: DC

## 2022-07-20 MED ORDER — ACETAMINOPHEN 500 MG PO TABS
1000.0000 mg | ORAL_TABLET | Freq: Four times a day (QID) | ORAL | Status: DC
  Filled 2022-07-20: qty 2

## 2022-07-20 MED ORDER — NALOXONE HCL 0.4 MG/ML IJ SOLN: 0.4000 mg | INTRAMUSCULAR | Status: DC | PRN

## 2022-07-20 MED ORDER — IBUPROFEN 600 MG PO TABS: 600.0000 mg | ORAL_TABLET | Freq: Four times a day (QID) | ORAL | Status: DC

## 2022-07-20 MED ORDER — PHENYLEPHRINE HCL-NACL 20-0.9 MG/250ML-% IV SOLN
INTRAVENOUS | Status: DC | PRN
  Administered 2022-07-20: 50 ug/min via INTRAVENOUS

## 2022-07-20 MED ORDER — DIBUCAINE (PERIANAL) 1 % EX OINT: 1.0000 | TOPICAL_OINTMENT | CUTANEOUS | Status: DC | PRN

## 2022-07-20 MED ORDER — ACETAMINOPHEN 325 MG PO TABS
650.0000 mg | ORAL_TABLET | ORAL | Status: DC | PRN
  Administered 2022-07-20: 650 mg via ORAL

## 2022-07-20 MED ORDER — DIPHENHYDRAMINE HCL 25 MG PO CAPS: 25.0000 mg | ORAL_CAPSULE | ORAL | Status: DC | PRN

## 2022-07-20 MED ORDER — DOCUSATE SODIUM 100 MG PO CAPS: 100.0000 mg | ORAL_CAPSULE | Freq: Every day | ORAL | Status: DC

## 2022-07-20 MED ORDER — SOD CITRATE-CITRIC ACID 500-334 MG/5ML PO SOLN
ORAL | Status: AC
  Filled 2022-07-20: qty 15

## 2022-07-20 MED ORDER — PHENYLEPHRINE HCL (PRESSORS) 10 MG/ML IV SOLN
INTRAVENOUS | Status: DC | PRN
  Administered 2022-07-20 (×2): 80 ug via INTRAVENOUS

## 2022-07-20 MED ORDER — FENTANYL CITRATE (PF) 100 MCG/2ML IJ SOLN
INTRAMUSCULAR | Status: AC
  Filled 2022-07-20: qty 2

## 2022-07-20 MED ORDER — CEFAZOLIN IN SODIUM CHLORIDE 3-0.9 GM/100ML-% IV SOLN
3.0000 g | INTRAVENOUS | Status: AC
  Administered 2022-07-20: 3 g via INTRAVENOUS
  Filled 2022-07-20: qty 100

## 2022-07-20 MED ORDER — MEPERIDINE HCL 25 MG/ML IJ SOLN: 6.2500 mg | INTRAMUSCULAR | Status: DC | PRN

## 2022-07-20 MED ORDER — BUPIVACAINE HCL (PF) 0.5 % IJ SOLN
INTRAMUSCULAR | Status: AC
  Filled 2022-07-20: qty 30

## 2022-07-20 MED ORDER — WITCH HAZEL-GLYCERIN EX PADS: 1.0000 | MEDICATED_PAD | CUTANEOUS | Status: DC | PRN

## 2022-07-20 MED ORDER — ONDANSETRON HCL 4 MG/2ML IJ SOLN
4.0000 mg | Freq: Four times a day (QID) | INTRAMUSCULAR | Status: DC | PRN
  Filled 2022-07-20: qty 2

## 2022-07-20 MED ORDER — GABAPENTIN 300 MG PO CAPS
300.0000 mg | ORAL_CAPSULE | Freq: Every day | ORAL | Status: DC
  Administered 2022-07-20 – 2022-07-22 (×3): 300 mg via ORAL
  Filled 2022-07-20 (×3): qty 1

## 2022-07-20 MED ORDER — TETANUS-DIPHTH-ACELL PERTUSSIS 5-2.5-18.5 LF-MCG/0.5 IM SUSY
0.5000 mL | PREFILLED_SYRINGE | Freq: Once | INTRAMUSCULAR | Status: AC
  Administered 2022-07-22: 0.5 mL via INTRAMUSCULAR
  Filled 2022-07-20: qty 0.5

## 2022-07-20 MED ORDER — SODIUM CHLORIDE 0.9% FLUSH: 3.0000 mL | INTRAVENOUS | Status: DC | PRN

## 2022-07-20 MED ORDER — ACETAMINOPHEN 500 MG PO TABS
1000.0000 mg | ORAL_TABLET | Freq: Four times a day (QID) | ORAL | Status: DC
  Administered 2022-07-20 – 2022-07-23 (×11): 1000 mg via ORAL
  Filled 2022-07-20 (×11): qty 2

## 2022-07-20 MED ORDER — ONDANSETRON HCL 4 MG/2ML IJ SOLN
INTRAMUSCULAR | Status: DC | PRN
  Administered 2022-07-20: 4 mg via INTRAVENOUS

## 2022-07-20 SURGICAL SUPPLY — 34 items
CHLORAPREP W/TINT 26 (MISCELLANEOUS) ×1 IMPLANT
DERMABOND ADVANCED .7 DNX12 (GAUZE/BANDAGES/DRESSINGS) IMPLANT
DRSG TELFA 3X8 NADH STRL (GAUZE/BANDAGES/DRESSINGS) ×1 IMPLANT
ELECT REM PT RETURN 9FT ADLT (ELECTROSURGICAL) ×1
ELECTRODE REM PT RTRN 9FT ADLT (ELECTROSURGICAL) ×1 IMPLANT
EXTENDER TRAXI PANNICULUS (MISCELLANEOUS) IMPLANT
EXTRT SYSTEM ALEXIS 17CM (MISCELLANEOUS) ×1
GAUZE SPONGE 4X4 12PLY STRL (GAUZE/BANDAGES/DRESSINGS) ×1 IMPLANT
GOWN STRL REUS W/ TWL LRG LVL3 (GOWN DISPOSABLE) ×3 IMPLANT
GOWN STRL REUS W/TWL LRG LVL3 (GOWN DISPOSABLE) ×3
KIT PREVENA INCISION MGT20CM45 (CANNISTER) IMPLANT
MANIFOLD NEPTUNE II (INSTRUMENTS) ×1 IMPLANT
MAT PREVALON FULL STRYKER (MISCELLANEOUS) ×1 IMPLANT
NDL HYPO 25GX1X1/2 BEV (NEEDLE) ×1 IMPLANT
NEEDLE HYPO 25GX1X1/2 BEV (NEEDLE) ×1 IMPLANT
NS IRRIG 1000ML POUR BTL (IV SOLUTION) ×1 IMPLANT
PACK C SECTION AR (MISCELLANEOUS) ×1 IMPLANT
PAD OB MATERNITY 4.3X12.25 (PERSONAL CARE ITEMS) ×1 IMPLANT
PAD PREP OB/GYN DISP 24X41 (PERSONAL CARE ITEMS) ×1 IMPLANT
SCRUB CHG 4% DYNA-HEX 4OZ (MISCELLANEOUS) ×1 IMPLANT
SUT MNCRL 4-0 (SUTURE) ×1
SUT MNCRL 4-0 27XMFL (SUTURE) ×1
SUT PLAIN GUT 2-0 30 C14 SG823 (SUTURE) ×1
SUT VIC AB 0 CT1 36 (SUTURE) ×2 IMPLANT
SUT VIC AB 0 CTX 36 (SUTURE) ×2
SUT VIC AB 0 CTX36XBRD ANBCTRL (SUTURE) ×2 IMPLANT
SUT VIC AB 2-0 SH 27 (SUTURE) ×2
SUT VIC AB 2-0 SH 27XBRD (SUTURE) ×2 IMPLANT
SUTURE MNCRL 4-0 27XMF (SUTURE) ×1 IMPLANT
SUTURE PLN GUT2-0 30 C14 SG823 (SUTURE) IMPLANT
SYR 30ML LL (SYRINGE) ×2 IMPLANT
SYSTEM CONTND EXTRCTN KII BLLN (MISCELLANEOUS) IMPLANT
TRAP FLUID SMOKE EVACUATOR (MISCELLANEOUS) ×1 IMPLANT
WATER STERILE IRR 500ML POUR (IV SOLUTION) ×1 IMPLANT

## 2022-07-20 NOTE — Anesthesia Preprocedure Evaluation (Signed)
Anesthesia Evaluation  Patient identified by MRN, date of birth, ID band Patient awake    Reviewed: Allergy & Precautions, NPO status , Patient's Chart, lab work & pertinent test results  Airway Mallampati: III  TM Distance: >3 FB Neck ROM: full    Dental  (+) Chipped, Dental Advidsory Given   Pulmonary neg pulmonary ROS   Pulmonary exam normal        Cardiovascular Exercise Tolerance: Good Normal cardiovascular exam+ dysrhythmias      Neuro/Psych    GI/Hepatic ,GERD  Medicated and Controlled,,  Endo/Other  diabetes  Morbid obesity  Renal/GU   negative genitourinary   Musculoskeletal   Abdominal   Peds  Hematology negative hematology ROS (+)   Anesthesia Other Findings Patient states that she was diagnosed with tachycardia several years ago. Her normal heart rate is in the 130-140s. States she was never told a reason. Doesn't affect her daily life and is asymptomatic.   Past Medical History: No date: Bradycardia No date: Depression No date: GERD (gastroesophageal reflux disease) No date: Gestational diabetes No date: Hemorrhoids during pregnancy in third trimester 08/07/2016: Menometrorrhagia No date: Mental disorder No date: Morbid obesity (HCC) 07/14/2018: Pelvic pain No date: Psoriasis  Past Surgical History: No date: INNER EAR SURGERY     Comment:  right 11/19/2018: LAPAROSCOPY; N/A     Comment:  Procedure: LAPAROSCOPY DIAGNOSTIC AND BIOPSY;  Surgeon:               Nadara Mustard, MD;  Location: [facility] ORS;  Service:               Gynecology;  Laterality: N/A; No date: SHOULDER SURGERY     Comment:  right  BMI    Body Mass Index: 45.11 kg/m      Reproductive/Obstetrics (+) Pregnancy                             Anesthesia Physical Anesthesia Plan  ASA: 3  Anesthesia Plan: Spinal   Post-op Pain Management:    Induction:   PONV Risk Score and Plan: 2 and  Ondansetron, Dexamethasone and TIVA  Airway Management Planned: Natural Airway and Nasal Cannula  Additional Equipment:   Intra-op Plan:   Post-operative Plan:   Informed Consent: I have reviewed the patients History and Physical, chart, labs and discussed the procedure including the risks, benefits and alternatives for the proposed anesthesia with the patient or authorized representative who has indicated his/her understanding and acceptance.     Dental Advisory Given  Plan Discussed with: Anesthesiologist, CRNA and Surgeon  Anesthesia Plan Comments: (Patient reports no bleeding problems and no anticoagulant use.  Plan for spinal with backup GA  Patient consented for risks of anesthesia including but not limited to:  - adverse reactions to medications - damage to eyes, teeth, lips or other oral mucosa - nerve damage due to positioning  - risk of bleeding, infection and or nerve damage from spinal that could lead to paralysis - risk of headache or failed spinal - damage to teeth, lips or other oral mucosa - sore throat or hoarseness - damage to heart, brain, nerves, lungs, other parts of body or loss of life  Patient voiced understanding.)       Anesthesia Quick Evaluation

## 2022-07-20 NOTE — Anesthesia Procedure Notes (Signed)
Spinal  Patient location during procedure: OR Start time: 07/20/2022 1:15 PM End time: 07/20/2022 1:18 PM Reason for block: surgical anesthesia Staffing Performed: resident/CRNA  Anesthesiologist: Lauralyn Primes, CRNA Resident/CRNA: Karoline Caldwell, CRNA Performed by: Karoline Caldwell, CRNA Authorized by: Stephanie Coup, MD   Preanesthetic Checklist Completed: patient identified, IV checked, site marked, risks and benefits discussed, surgical consent, monitors and equipment checked, pre-op evaluation and timeout performed Spinal Block Patient position: sitting Prep: ChloraPrep Patient monitoring: heart rate, continuous pulse ox, blood pressure and cardiac monitor Approach: midline Location: L3-4 Injection technique: single-shot Needle Needle type: Whitacre and Introducer  Needle gauge: 24 G Needle length: 9 cm Assessment Sensory level: T4 Events: CSF return Additional Notes Sterile aseptic technique used throughout the procedure.  Negative paresthesia. Negative blood return. Positive free-flowing CSF. Expiration date of kit checked and confirmed. Patient tolerated procedure well, without complications.

## 2022-07-20 NOTE — Op Note (Addendum)
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  Procedure: Primary Low Transverse Cesarean Section through Pfannenstiel incision; bilateral tubal sterilization  Indication: 14NW [id number] at [redacted]w[redacted]d with di/di twins "Willow & Dan Humphreys", breech presentation of baby A, in active labor. Contractions q2-3 min, stronger with bloody show since arrival to triage.   Surgeon: Christeen Douglas, MD  Assistant(s):  Donato Schultz, CNM   An experienced assistant was required given the standard of surgical care given the complexity of the case.  This assistant was needed for exposure, dissection, suctioning, retraction, instrument exchange, Wilson CNM assisting with delivery with administration of fundal pressure, and for overall help during the procedure.   Anesthesia: Spinal anesthesia  Anesthesiologist: Stephanie Coup, MD Anesthesiologist: Stephanie Coup, MD CRNA: Karoline Caldwell, CRNA  Estimated Blood Loss:   850         Drains: foley         Total IV Fluids:  Urine Output:         Specimens: placentax2         Complications:  None; patient tolerated the procedure well.         Disposition: PACU - hemodynamically stable.         Condition: stable  Findings: Baby A:   A female infant in complete breech with breech on the left, but not completely transverse. "Willow" Amniotic fluid - Clear  Birth weight pending  Apgars of 8 and 9 at one and five minutes respectively.   Baby B:   A female infant "Walker" in transverse presentation. Amniotic fluid - Clear  Birth weight 2220 g.  Apgars of 7 and 8 at one and  five minutes respectively.   Both babies to special care nursery   Intact placenta with a three-vessel cord.  Grossly normal uterus, tubes and ovaries bilaterally. No intraabdominal adhesions were noted.  Indications: malpresentation: breech presenation  Procedure Details  The patient was taken to Operating Room, identified as the correct patient and the procedure verified as C-Section Delivery. A formal Time Out was held with all team members present and in agreement.  After induction of anesthesia, the patient was draped and prepped in the usual sterile manner. A Pfannenstiel skin incision was made and carried down through the subcutaneous tissue to the fascia. Fascial incision was made and extended transversely with the Mayo scissors. The fascia was separated from the underlying rectus tissue superiorly and inferiorly. The peritoneum was identified and entered bluntly. Peritoneal incision was extended longitudinally. The utero-vesical peritoneal reflection was incised transversely and a bladder flap was created digitally.   A low transverse hysterotomy was made. The amniotic sac was ruptured for fluid as above. Baby A was delivered atraumatically using standard breech maneuvers. The umbilical cord was clamped x2 and cut and the infant was handed to the awaiting pediatricians. Baby B was brought to the hysterotomy, and the amniotic sac ruptured. Baby B was delivered atraumatically. The cord was doubly clamped, cut and Baby B was passed off to awaiting NICU staff.  The placenta was removed intact and appeared normal, intact, and with two 3-vessel cords. Baby A's cord was  marked with a single clamp, and Baby's cord was marked with double clamps.  The uterus was exteriorized and cleared of all clot and debris. The hysterotomy was closed with running sutures of 0-Vicryl. A second imbricating layer was placed with the same suture. Excellent hemostasis was observed. The peritoneal cavity was cleared of  all clots and debris. The uterus was returned to the abdomen.   The pelvis was blotted and again, excellent hemostasis was noted.  Attention was then turned to the tubal ligation. The left fallopian tube distinguished from the round ligament by identifying the fimbria and was grasped with a Babcock clamp in the midisthmic portion approximately 3 cm from the cornual region. It was then doubly ligated with 0-plain gut suture in a Parkland fashion. The tubal segment was excised with Metzenbaum scissors. Tubal ostea noted. The fimbriae were incorporated into the stitch.The procedure was repeated on the right side. *Care was noted to examine both tubal sites in situ to ensure the sutures were intact and no bleeding was noted. The uterus was returned to the abdomen.  The fascia was then reapproximated with running sutures of 0 Vicryl.  The subcutaneous tissue was reapproximated with running sutures of plain gut. The skin was reapproximated with Insorb absorbable sutures.  Bupivicaine along the fascial and skin lines.  Instrument, sponge, and needle counts were correct prior to the abdominal closure and at the conclusion of the case.   The patient tolerated the procedure well and was transferred to the recovery room in stable condition.   Christeen Douglas, MD 07/20/2022

## 2022-07-20 NOTE — Progress Notes (Signed)
Patient ID: Isabel Higgins, female   DOB: 1995-05-27, 27 y.o.   MRN: [id number]  26yo [id number] at [redacted]w[redacted]d with di/di twins "Willow & Dan Humphreys", breech presentation of baby A, in active labor. Contractions q2-3 min, stronger with bloody show since arrival to triage.  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  The risks of cesarean section discussed with the patient included but were not limited to: bleeding which may require transfusion or reoperation; infection which may require antibiotics; injury to bowel, bladder, ureters or other surrounding organs; injury to the fetus; need for additional procedures including hysterectomy in the event of a life-threatening hemorrhage; placental abnormalities wth subsequent pregnancies, incisional problems, thromboembolic phenomenon and other postoperative/anesthesia complications. The patient concurred with the proposed plan, giving informed written consent for the procedure.   Patient has been NPO since last night and  she will remain NPO for procedure. Anesthesia and OR aware. Preoperative prophylactic antibiotics and SCDs ordered on call to the OR.  To OR when ready.

## 2022-07-20 NOTE — Lactation Note (Addendum)
This note was copied from a baby's chart. Lactation Consultation Note  Patient Name: Isabel Higgins [id number] Date: 07/20/2022 Age:29 hours Reason for consult: L&D Initial assessment;Late-preterm 34-36.6wks   Maternal Data Mom [id number], delivered by C/S for malpresentation. Babies 35 4/7 week di/di twins, 1 female and 1 female. Mom with history of gDMA2(on insulin), pregestational BMI 43, psoriasis. Baby boy is in SCN and baby Isabel is now  rooming in with mom.   At first lactation visit in L&D baby was breastfeeding when [facility] entered the room.Transitional care nurse assisted mom with first breastfeeding. Baby had been  latched for 6 minutes.  LC did observe baby latched and swallows were noted.. Assisted mom with initiating pumping. Reviewed late preterm plan with mom.    Has patient been taught Hand Expression?: Yes Does the patient have breastfeeding experience prior to this delivery?: Yes How long did the patient breastfeed?: !st child 3 years 2nd child > than 1 year  Feeding Mother's Current Feeding Choice: Breast Milk (Mom will post pump to provide additional milk after baby breastfeeds. If mom pumps and does not have measureable amounts of colostrum mom agreeable to formula supplementation. Reviewed late preterm feeding plan with mom. Mom is agreeable with plan.)  LATCH Score Latch: Grasps breast easily, tongue down, lips flanged, rhythmical sucking.  Audible Swallowing: A few with stimulation  Type of Nipple: Everted at rest and after stimulation  Comfort (Breast/Nipple): Soft / non-tender  Hold (Positioning): Assistance needed to correctly position infant at breast and maintain latch.  LATCH Score: 8   Lactation Tools Discussed/Used  DEBP  Interventions Interventions: Education;LPT handout/interventions Reviewed with mom characteristics of late preterm infants and breastfeeding with the potential for inconsistent breastfeeding. Mom verbalized understanding of information  provided.  Discharge Pump: Personal (Medela pump)  Consult Status Consult Status: Follow-up from L&D  Update provided to care nurse.  Fuller Song 07/20/2022, 5:49 PM

## 2022-07-20 NOTE — Transfer of Care (Signed)
Immediate Anesthesia Transfer of Care Note  Patient: Isabel Higgins  Procedure(s) Performed: CESAREAN SECTION WITH BILATERAL TUBAL LIGATION (Bilateral)  Patient Location: PACU  Anesthesia Type:spinal  Level of Consciousness: awake, alert , and oriented  Airway & Oxygen Therapy: Patient Spontanous Breathing  Post-op Assessment: Report given to RN and Post -op Vital signs reviewed and stable  Post vital signs: Reviewed and stable  Last Vitals:  Vitals Value Taken Time  BP 126/74 07/20/22 1501  Temp 36.5 C 07/20/22 1501  Pulse 90 07/20/22 1501  Resp 20 07/20/22 1501  SpO2 100 % 07/20/22 1501  Vitals shown include unvalidated device data.  Last Pain:  Vitals:   07/20/22 1501  TempSrc:   PainSc: 0-No pain         Complications: No notable events documented.

## 2022-07-20 NOTE — OB Triage Note (Signed)
Patient to OBS 4 with complaints of headache and vomiting that began around 5pm on 07/19/22.  Patient reports babies moving normally.  No bleeding.  Discharge described as normal clear to milky color may be increased amount but no irritation or redness reported.

## 2022-07-21 LAB — CBC
HCT: 29.1 % — ABNORMAL LOW (ref 36.0–46.0)
Hemoglobin: 9.1 g/dL — ABNORMAL LOW (ref 12.0–15.0)
MCH: 25.6 pg — ABNORMAL LOW (ref 26.0–34.0)
MCHC: 31.3 g/dL (ref 30.0–36.0)
MCV: 81.7 fL (ref 80.0–100.0)
Platelets: 136 10*3/uL — ABNORMAL LOW (ref 150–400)
RBC: 3.56 MIL/uL — ABNORMAL LOW (ref 3.87–5.11)
RDW: 17.2 % — ABNORMAL HIGH (ref 11.5–15.5)
WBC: 8.1 10*3/uL (ref 4.0–10.5)
nRBC: 0.2 % (ref 0.0–0.2)

## 2022-07-21 LAB — GLUCOSE, CAPILLARY
Glucose-Capillary: 75 mg/dL (ref 70–99)
Glucose-Capillary: 133 mg/dL — ABNORMAL HIGH (ref 70–99)

## 2022-07-21 MED ORDER — IBUPROFEN 600 MG PO TABS
600.0000 mg | ORAL_TABLET | Freq: Four times a day (QID) | ORAL | Status: DC
  Administered 2022-07-21 – 2022-07-23 (×9): 600 mg via ORAL
  Filled 2022-07-21 (×9): qty 1

## 2022-07-21 NOTE — Anesthesia Postprocedure Evaluation (Signed)
Anesthesia Post Note  Patient: Isabel Higgins  Procedure(s) Performed: CESAREAN SECTION WITH BILATERAL TUBAL LIGATION (Bilateral)  Patient location during evaluation: Mother Baby Anesthesia Type: Spinal Level of consciousness: awake and alert Pain management: pain level controlled Vital Signs Assessment: post-procedure vital signs reviewed and stable Respiratory status: spontaneous breathing, nonlabored ventilation and respiratory function stable Cardiovascular status: stable Postop Assessment: no headache, no backache and epidural receding Anesthetic complications: no   No notable events documented.   Last Vitals:  Vitals:   07/21/22 0500 07/21/22 0742  BP:  110/83  Pulse: 86 96  Resp:  18  Temp:  36.9 C  SpO2: 95% 99%    Last Pain:  Vitals:   07/21/22 0742  TempSrc: Oral  PainSc:                  Corinda Gubler

## 2022-07-21 NOTE — Lactation Note (Signed)
This note was copied from a baby's chart. Lactation Consultation Note  Patient Name: Isabel Higgins Date: 07/21/2022 Age:29 hours     Maternal Data  Mom [id number], delivered by C/S for malpresentation. Babies 35 4/7 week di/di twins, 1 female and 1 female. Mom with history of gDMA2(on insulin), pregestational BMI 43, psoriasis. Baby boy is in SCN and baby Isabel is rooming in with mom.   On follow-up visit mom reports she has been following LPT feeding plan, baby has been feeding every 3 hours.Mom reports the last pump session she did not get much and she had been expressing good measurable amounts of milk.   Has patient been taught Hand Expression?: Yes Does the patient have breastfeeding experience prior to this delivery?: Yes How long did the patient breastfeed?: !st child 3 years 2nd child > than 1 year  Feeding  Mother's Current Feeding Choice: Breast Milk (Mom will post pump to provide additional milk after baby breastfeeds. If mom pumps and does not have measureable amounts of colostrum mom agreeable to formula supplementation.    Lactation Tools Discussed/Used  Provided strategies and tips to establish milk supply. Also, discussed with mom if she is becoming fatigued with triple feeding plan to consider skipping one breastfeeding session and let dad bottle feed baby while she take 1 longer stretch stretch of sleep of 4 hours. Support and encouragement provided  Interventions: Education, LPT feeding plan    Discharge: engorgement and breast care management. Mom has a Medela DEBP for home use.   Consult Status  Follow-up 07/22/22 In-patient  Update provided to care nurse.  Fuller Song 07/21/2022, 4:27 PM

## 2022-07-21 NOTE — Clinical Social Work Maternal (Signed)
CLINICAL SOCIAL WORK MATERNAL/CHILD NOTE  Patient Details  Name: Isabel Higgins MRN: [id number] Date of Birth: 23-Sep-1995  Date:  07/21/2022  Clinical Social Worker Initiating Note:  Susa Simmonds, [place] Date/Time: Initiated:  07/21/22/1050     Child's Name:  Baruch Merl and Dan Humphreys   Biological Parents:      Need for Interpreter:  None   Reason for Referral:  Other (Comment) Inocente Salles scale)   Address:  [address] [place] [address]    Phone number:  [phone] (home)     Additional phone number:  Household Members/Support Persons (HM/SP):   Household Member/Support Person 1   HM/SP Name Relationship DOB or Age  HM/SP -1 Tyler Hyes Husband    HM/SP -2        HM/SP -3        HM/SP -4        HM/SP -5        HM/SP -6        HM/SP -7        HM/SP -8          Natural Supports (not living in the home):  Immediate Family   Professional Supports: None   Employment: Full-time   Type of Work: Journalist, newspaper clinic   Education:  Some Automotive engineer   Homebound arranged:    Surveyor, quantity Resources:  Media planner    Other Resources:      Cultural/Religious Considerations Which May Impact Care:  NA  Strengths:  Ability to meet basic needs     Psychotropic Medications:         Pediatrician:   [facility]    Pediatrician List:   [facility] [place]    [place]      Pediatrician Fax Number: [phone]  Risk Factors/Current Problems:  None   Cognitive State:  Alert     Mood/Affect:  Calm     CSW Assessment: CSW Assessment: CSW received a consult for?MOB's Edinburgh Postnatal Depression Scale results.   CSW explained CSW's role and reason for referral to MOB.   MOB reported she is feeling?good?post-delivery. MOB was alert and appropriate during the assessment.   Confirmed contact information for MOB. MOB and Baby will be living with?FOB who is her husband Isabel Higgins.)  and her 29-year-old and 65-year-old sons.    MOB reported she does not receive WIC or food stamps and is unsure if she will qualify.   MOB plans to use the [facility] in [place]?for the twins Medical Care.    MOB reported she has a crib,?bassinet, car seat (new), clothing, diapers, and all other items needed for the twins.    MOB reported she has reliable transportation for herself and the twins.      MOB denied any need for resources at this time.    MOB reported she has?a history of depression before she had children. MOB stated her depression is managed and she currently takes venlafaxine. MOB stated that she has no history of receiving therapy or counseling and goes through her PCP for any needs.?MOB reported that she has a good support system and is coping well emotionally. MOB denied SI, HI, or DV. MOB denied the need for mental health support resources and reported she is aware of them if needed.   CSW provided education and information sheets on PPD and SIDS. MOB verbalized understanding. CSW encouraged MOB to reach  out to her Provider with any questions or needs for support or resources, even after discharge.    MOB denied any needs or questions. CSW encouraged MOB to reach out if any arise prior to discharge.    CSW Plan/Description:  No Further Intervention Required/No Barriers to Discharge    Susa Simmonds, LCSWA 07/21/2022, 1:18 PM

## 2022-07-21 NOTE — Progress Notes (Signed)
CSW attempted to speak with patient about her edinburgh scale results. Patient stated the pediatrician was present and asked CSW to contact her back later.    10:36 AM- CSW attempted to contact patient back and was told to call back in 10 minutes.

## 2022-07-21 NOTE — Anesthesia Post-op Follow-up Note (Signed)
  Anesthesia Pain Follow-up Note  Patient: Isabel Higgins  Day #: 1  Date of Follow-up: 07/21/2022 Time: 9:03 AM  Last Vitals:  Vitals:   07/21/22 0500 07/21/22 0742  BP:  110/83  Pulse: 86 96  Resp:  18  Temp:  36.9 C  SpO2: 95% 99%    Level of Consciousness: alert  Pain: mild   Side Effects:None  Catheter Site Exam:clean, dry, no drainage  Anti-Coag Meds (From admission, onward)    Start     Dose/Rate Route Frequency Ordered Stop   07/21/22 0800  enoxaparin (LOVENOX) injection 40 mg        40 mg Subcutaneous Every 24 hours 07/20/22 1850          Plan: D/C from anesthesia care at surgeon's request  Corinda Gubler

## 2022-07-21 NOTE — Progress Notes (Signed)
Postop Day  1  Subjective: no complaints, up ad lib, voiding, and tolerating PO  Doing well, no concerns. Ambulating without difficulty, pain managed with PO meds, tolerating regular diet, and voiding without difficulty.   No fever/chills, chest pain, shortness of breath, nausea/vomiting, or leg pain. No nipple or breast pain. No headache, visual changes, or RUQ/epigastric pain.  Objective: BP 110/83 (BP Location: Left Arm)   Pulse 96   Temp 98.5 F (36.9 C) (Oral)   Resp 18   Ht 5\' 7"  (1.702 m)   Wt 130.6 kg   LMP 11/10/2021   SpO2 99%   Breastfeeding Unknown   BMI 45.11 kg/m    Physical Exam:  General: alert, cooperative, and no distress Breasts: soft/nontender CV: RRR Pulm: nl effort, CTABL Abdomen: soft, non-tender, active bowel sounds Uterine Fundus: firm Incision: no significant drainage, covered with Proveena dressing  Perineum: minimal edema, intact Lochia: appropriate DVT Evaluation: No evidence of DVT seen on physical exam.  Recent Labs    07/20/22 1027 07/21/22 0640  HGB 10.4* 9.1*  HCT 33.6* 29.1*  WBC 8.8 8.1  PLT 163 136*    Assessment/Plan: 26 y.o. [id number] postpartum day # 1  -Continue routine postpartum care -Lactation consult PRN for breastfeeding.  Baby A [facility]) rooming in and [facility] B Dan Humphreys) in [place].  Getting up to ambulate to SCN to visit with Walker.  -Acute blood loss anemia - hemodynamically stable and asymptomatic; start PO ferrous sulfate BID with stool softeners  -Immunization status:   all immunizations up to date  Disposition: Continue inpatient postpartum care    LOS: 1 day   Gustavo Lah, CNM 07/21/2022, 9:42 AM   ----- Margaretmary Eddy  Certified Nurse Midwife [facility]

## 2022-07-22 ENCOUNTER — Other Ambulatory Visit: Payer: Managed Care, Other (non HMO)

## 2022-07-22 ENCOUNTER — Encounter: Payer: Self-pay | Admitting: Obstetrics and Gynecology

## 2022-07-22 NOTE — Progress Notes (Signed)
Postop Day  2  Subjective: no complaints, up ad lib, voiding, tolerating PO, and + flatus  Doing well, no concerns. Ambulating without difficulty, pain managed with PO meds, tolerating regular diet, and voiding without difficulty.   No fever/chills, chest pain, shortness of breath, nausea/vomiting, or leg pain. No nipple or breast pain. No headache, visual changes, or RUQ/epigastric pain.  Objective: BP 124/72 (BP Location: Left Arm)   Pulse 88   Temp 98.2 F (36.8 C) (Oral)   Resp 18   Ht 5\' 7"  (1.702 m)   Wt 130.6 kg   LMP 11/10/2021   SpO2 98%   Breastfeeding Unknown   BMI 45.11 kg/m    Physical Exam:  General: alert, cooperative, and appears stated age Breasts: soft/nontender CV: RRR Pulm: nl effort, CTABL Abdomen: soft, non-tender, active bowel sounds Uterine Fundus: firm Incision: healing well, no significant drainage, no dehiscence, wound vac in place and patent Perineum: minimal edema, intact Lochia: appropriate DVT Evaluation: No evidence of DVT seen on physical exam. Negative Homan's sign. No cords or calf tenderness. No significant calf/ankle edema.  Recent Labs    07/20/22 1027 07/21/22 0640  HGB 10.4* 9.1*  HCT 33.6* 29.1*  WBC 8.8 8.1  PLT 163 136*    Assessment/Plan: 27 y.o. [id number] postpartum day # 2  -Continue routine postpartum care -Lactation consult PRN for breastfeeding   -Acute blood loss anemia - hemodynamically stable and asymptomatic; start PO ferrous sulfate BID with stool softeners  -Immunization status:   all immunizations up to date   Disposition: Continue inpatient postpartum care    LOS: 2 days   Elyn Krogh Wonda Amis, CNM 07/22/2022, 9:11 AM   ----- Chari Manning Certified Nurse Midwife [facility]

## 2022-07-23 ENCOUNTER — Inpatient Hospital Stay: Admission: RE | Admit: 2022-07-23 | Payer: Managed Care, Other (non HMO) | Source: Ambulatory Visit

## 2022-07-23 LAB — SURGICAL PATHOLOGY

## 2022-07-23 MED ORDER — SERTRALINE HCL 50 MG PO TABS: 50.0000 mg | ORAL_TABLET | Freq: Every day | ORAL | 11 refills | Status: AC

## 2022-07-23 MED ORDER — ACETAMINOPHEN 500 MG PO TABS: 500.0000 mg | ORAL_TABLET | Freq: Four times a day (QID) | ORAL | Status: AC | PRN

## 2022-07-23 MED ORDER — IBUPROFEN 600 MG PO TABS: 600.0000 mg | ORAL_TABLET | Freq: Four times a day (QID) | ORAL | Status: AC | PRN

## 2022-07-23 MED ORDER — FERROUS SULFATE 325 (65 FE) MG PO TABS: 325.0000 mg | ORAL_TABLET | Freq: Every day | ORAL | Status: AC

## 2022-07-23 MED ORDER — SENNOSIDES-DOCUSATE SODIUM 8.6-50 MG PO TABS: 2.0000 | ORAL_TABLET | Freq: Every evening | ORAL | Status: DC | PRN

## 2022-07-23 MED ORDER — OXYCODONE HCL 5 MG PO TABS: 5.0000 mg | ORAL_TABLET | Freq: Four times a day (QID) | ORAL | 0 refills | Status: DC | PRN

## 2022-07-23 NOTE — Progress Notes (Signed)
Patient discharged. Discharge instructions given. Patient verbalizes understanding. Transported by RN. 

## 2022-07-23 NOTE — Lactation Note (Signed)
This note was copied from a baby's chart. Lactation Consultation Note  Patient Name: Isabel Higgins [id number] Date: 07/23/2022 Age:29 hours Reason for consult: Follow-up assessment;NICU baby;Late-preterm 34-36.6wks;Infant < 6lbs;Multiple gestation  Lactation check-in before anticipated discharge. Mom and [name] (Isabel) will be going home today. Twin B (Boy) will remain in SCN.  Maternal Data Has patient been taught Hand Expression?: Yes Does the patient have breastfeeding experience prior to this delivery?: Yes How long did the patient breastfeed?: 59yr with first, 30yr with second  Mom confident in breastfeeding. Feeling well, ready for discharge.  Feeding Mother's Current Feeding Choice: Breast Milk  LPT baby following feeding plan. -BF baby on demand (minimum every 3 hours) Limit feeding at the breast to no more than 15 minutes -Provide appropriate volume for age via bottle (higher calorie formula) -Post pump  Mom reports using a hands free pump with her last child and states that she was able to express more than she could with a DEBP.  LATCH Score   Lactation Tools Discussed/Used Tools: Pump;Bottle Breast pump type: Double-Electric Breast Pump Reason for Pumping: twins, SCN twin b, 35 wk, LPT Pumping frequency: q 3 hrs  Interventions Interventions: Breast feeding basics reviewed;Hand express;Pre-pump if needed;DEBP;Ice;Education  Reviewed current feeding plan and importance of following this plan until directed otherwise; pediatrician will follow patient once discharged and can provide more insight into the feeding needs of baby and she grows.  Discharge Discharge Education: Outpatient recommendation;Engorgement and breast care;Warning signs for feeding baby Pump: Personal;Hands Free  Consult Status Consult Status: Complete  Outpatient lactation number provided; encouraged to call with questions/concerns, and ask for lactation when visiting baby in SCN if  needed.  [name] 07/23/2022, 9:47 AM

## 2022-07-23 NOTE — Discharge Summary (Signed)
Obstetrical Discharge Summary  Patient Name: Isabel Higgins DOB: Jul 10, 1995 MRN: [id number]  Date of Admission: 07/20/2022 Date of Delivery: 07/20/22 Delivered by: Dr. Dalbert Garnet Date of Discharge: 07/23/2022  Primary OB: [facility] [id number] last menstrual period was 11/10/2021. EDC Estimated Date of Delivery: 08/20/22 Gestational Age at Delivery: [redacted]w[redacted]d   Antepartum complications:  Di/DI Twins GDMA2 Obesity H/o macrosomia H/o PPH H/o GHTN Taking Humira for psoriasis  Admitting Diagnosis: Headache in pregnancy, antepartum, third trimester [O26.893, R51.9] Twin pregnancy, twins dichorionic and diamniotic [O30.049]  Secondary Diagnosis: Patient Active Problem List   Diagnosis Date Noted   Cesarean delivery delivered 07/23/2022   Twin pregnancy, twins dichorionic and diamniotic 07/12/2022   Fetal tachycardia affecting management of mother 07/04/2022   Decreased fetal movement 06/20/2022   Pregnancy 06/17/2022   Elevated BP without diagnosis of hypertension 06/06/2022   Dichorionic diamniotic twin pregnancy in third trimester 02/08/2022   High-risk pregnancy in third trimester 01/08/2022   Normal vaginal delivery of second pregnancy 10/09/2019   Postpartum care following vaginal delivery 10/09/2019   Encounter for induction of labor 10/08/2019   Bleeding external hemorrhoids 10/05/2019   [redacted] weeks gestation of pregnancy 09/27/2019   Headache in pregnancy 09/21/2019   Syncope 08/17/2019   Rectocele affecting obstetric care in second trimester 06/21/2019   Back pain affecting pregnancy in third trimester 06/02/2019   Adult BMI 40.0-44.9 kg/sq m (HCC) 03/29/2019   History of gestational hypertension 02/09/2019   Maternal obesity, antepartum 02/09/2019   Supervision of high risk pregnancy, antepartum 02/09/2019   High-risk pregnancy 02/09/2019   Endosalpingiosis 11/25/2018   Dysmenorrhea 11/13/2018   Abnormal uterine bleeding 07/14/2018   POTS (postural  orthostatic tachycardia syndrome) 03/06/2012    Discharge Diagnosis: Preterm Pregnancy Delivered and GDM A2      Augmentation: N/A Complications: None Intrapartum complications/course: see op note Delivery Type: primary cesarean section, low transverse incision Anesthesia: spinal anesthesia Placenta: spontaneous To Pathology: Yes  Laceration: n/a Episiotomy: none Newborn Data:   Penda, Venturi [[id number]]  Live born female  Birth Weight: 5 lb 12.8 oz (2630 g) APGAR: 8, 9  Newborn Delivery   Birth date/time: 07/20/2022 13:47:00 Delivery type: C-Section, Low Transverse Trial of labor: No C-section categorization: Primary       Imunique, Samad [[id number]]  Live born female  Birth Weight: 4 lb 14.3 oz (2220 g) APGAR: 7, 8  Newborn Delivery   Birth date/time: 07/20/2022 13:49:00 Delivery type: C-Section, Low Transverse Trial of labor: No C-section categorization: Primary      Postpartum Procedures: none Edinburgh:     07/21/2022    7:11 AM 11/22/2019    9:00 AM 10/21/2019    9:50 AM 10/21/2019    9:00 AM 10/09/2019    8:41 PM  Edinburgh Postnatal Depression Scale Screening Tool  I have been able to laugh and see the funny side of things. 0 1 1 1 1   I have looked forward with enjoyment to things. 0 1 1 1  0  I have blamed myself unnecessarily when things went wrong. 3 3 3 3 2   I have been anxious or worried for no good reason. 3 3 3 3 2   I have felt scared or panicky for no good reason. 2 3 3 3 1   Things have been getting on top of me. 1 2 2 2 1   I have been so unhappy that I have had difficulty sleeping. 2 2 2 2 1   I have felt sad or miserable. 2 2 2  2 1  I have been so unhappy that I have been crying. 2 1 2 2 1   The thought of harming myself has occurred to me. 0 0 0 0 0  Edinburgh Postnatal Depression Scale Total 15 18 19 19 10      Post partum course:  Patient had an uncomplicated postpartum course.  By time of discharge on POD#3, her pain was controlled  on oral pain medications; she had appropriate lochia and was ambulating, voiding without difficulty, tolerating regular diet and passing flatus.   She was deemed stable for discharge to home.    Discharge Physical Exam:  BP 117/73 (BP Location: Left Arm)   Pulse 91   Temp 98.6 F (37 C)   Resp 20   Ht 5\' 7"  (1.702 m)   Wt 130.6 kg   LMP 11/10/2021   SpO2 99%   Breastfeeding Unknown   BMI 45.11 kg/m   General: NAD CV: RRR Pulm: nl effort ABD: s/nd/nt, fundus firm and below the umbilicus Lochia: moderate Perineum: n/a Incision: wound vac in place and working well, no drainage noted DVT Evaluation: LE non-ttp, no evidence of DVT on exam.  Hemoglobin  Date Value Ref Range Status  07/21/2022 9.1 (L) 12.0 - 15.0 g/dL Final  16/12/9602 54.0 11.1 - 15.9 g/dL Final   HCT  Date Value Ref Range Status  07/21/2022 29.1 (L) 36.0 - 46.0 % Final   Hematocrit  Date Value Ref Range Status  07/23/2019 38.2 34.0 - 46.6 % Final    Risk assessment for postpartum VTE and prophylactic treatment: Very high risk factors: None High risk factors: BMI 40-50 kg/m2 Moderate risk factors: Multiple gestation  and Cesarean delivery   Postpartum VTE prophylaxis with LMWH not indicated  Disposition: stable, discharge to home. Baby Feeding: breast feeding Baby Disposition: female infant home with mom, female infant in SCN  Rh Immune globulin indicated: No Rubella vaccine given: was not indicated Varivax vaccine given: was not indicated Flu vaccine given in AP setting: n/a Tdap vaccine given in AP setting: Yes   Contraception: bilateral tubal ligation - completed  Prenatal Labs:    Plan:  Isabel Higgins was discharged to home in good condition.   Discharge Medications: Allergies as of 07/23/2022       Reactions   Honeysuckle Flower [lonicera] Swelling, Rash, Other (See Comments)   Other Reaction: BREAK OUT, FACIAL SWELLING, WE   Amoxicillin Nausea And Vomiting   Did it involve  swelling of the face/tongue/throat, SOB, or low BP? No Did it involve sudden or severe rash/hives, skin peeling, or any reaction on the inside of your mouth or nose? No Did you need to seek medical attention at a hospital or doctor's office? No When did it last happen?     Can tolerate but gets an upset stomach  If all above answers are "NO", may proceed with cephalosporin use.        Medication List     STOP taking these medications    Accu-Chek Guide test strip Generic drug: glucose blood   Accu-Chek Guide w/Device Kit   Accu-Chek Softclix Lancets lancets   aspirin EC 81 MG tablet   HumuLIN N KwikPen 100 UNIT/ML Kiwkpen Generic drug: Insulin NPH (Human) (Isophane)   Insulin Regular Human 100 UNIT/ML KwikPen Commonly known as: HUMULIN R   metformin 1000 MG (OSM) 24 hr tablet Commonly known as: FORTAMET   metFORMIN 500 MG tablet Commonly known as: GLUCOPHAGE   ondansetron 8 MG tablet Commonly  known as: ZOFRAN   PEN NEEDLES 31GX5/16" 31G X 8 MM Misc   venlafaxine XR 150 MG 24 hr capsule Commonly known as: EFFEXOR-XR       TAKE these medications    acetaminophen 500 MG tablet Commonly known as: TYLENOL Take 1 tablet (500 mg total) by mouth every 6 (six) hours as needed.   ferrous sulfate 325 (65 FE) MG tablet Take 1 tablet (325 mg total) by mouth daily with breakfast.   ibuprofen 600 MG tablet Commonly known as: ADVIL Take 1 tablet (600 mg total) by mouth every 6 (six) hours as needed.   omeprazole 40 MG capsule Commonly known as: PRILOSEC Take 40 mg by mouth daily.   oxyCODONE 5 MG immediate release tablet Commonly known as: Oxy IR/ROXICODONE Take 1 tablet (5 mg total) by mouth every 6 (six) hours as needed for severe pain.   prenatal multivitamin Tabs tablet Take 1 tablet by mouth daily at 12 noon.   senna-docusate 8.6-50 MG tablet Commonly known as: Senokot-S Take 2 tablets by mouth at bedtime as needed for mild constipation.   sertraline 50  MG tablet Commonly known as: Zoloft Take 1 tablet (50 mg total) by mouth daily.         Follow-up Information     Christeen Douglas, MD. Schedule an appointment as soon as possible for a visit in 5 day(s).   Specialty: Obstetrics and Gynecology Why: provena removal Contact information: [address] [place] [address] [phone]                 Signed: Cyril Mourning 07/23/2022 2:02 PM

## 2022-07-27 ENCOUNTER — Ambulatory Visit: Payer: Self-pay

## 2022-07-27 NOTE — Lactation Note (Signed)
This note was copied from a baby's chart. Lactation Consultation Note  Patient Name: Ginevra Puglise [id number] Date: 07/27/2022 Age:29 days Reason for consult: Follow-up assessment;NICU baby   Maternal Data  Mom states she pumps 6 oz each session, also breastfeeding female twin at home  Feeding Mother's Current Feeding Choice: Breast Milk Nipple Type: Dr. Lorne Skeens Assisted mom with latching baby to right breast in cradle hold, baby having difficulty getting deep latch, but with breast shaping and gently chin pressure to wide mouth baby was able to latch well and nursed x 10 min before tiring and coming off breast and fell asleep LATCH Score Latch: Grasps breast easily, tongue down, lips flanged, rhythmical sucking. (after few attemps)  Audible Swallowing: Spontaneous and intermittent  Type of Nipple: Everted at rest and after stimulation  Comfort (Breast/Nipple): Soft / non-tender  Hold (Positioning): Assistance needed to correctly position infant at breast and maintain latch.  LATCH Score: 9   Lactation Tools Discussed/Used    Interventions Interventions: Assisted with latch;Breast compression;Support pillows  Discharge Pump: Personal  Consult Status Consult Status: PRN    Dyann Kief 07/27/2022, 5:27 PM

## 2022-07-29 ENCOUNTER — Inpatient Hospital Stay: Admit: 2022-07-29 | Payer: Managed Care, Other (non HMO) | Admitting: Obstetrics and Gynecology

## 2022-07-29 ENCOUNTER — Encounter: Admission: EM | Disposition: A | Discharge status: Home / Self Care | Payer: Self-pay | Source: Home / Self Care

## 2022-07-29 SURGERY — Surgical Case
Anesthesia: Spinal | Laterality: Bilateral

## 2022-07-31 ENCOUNTER — Ambulatory Visit: Payer: Managed Care, Other (non HMO)

## 2022-07-31 ENCOUNTER — Other Ambulatory Visit: Payer: Managed Care, Other (non HMO)

## 2022-08-04 NOTE — H&P (Incomplete)
OB History & Physical   History of Present Illness:  Chief Complaint:   HPI:  Isabel Higgins is a 27 y.o. [phone] female at [redacted]w[redacted]d dated by LMP.  She presents to L&D for uterine contractions that are getting stronger and closer together and feel like labor. Reports good fetal movement, denies leaking fluid.    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 Bradycardia   . Depression   . GERD (gastroesophageal reflux disease)   . Gestational diabetes   . Hemorrhoids during pregnancy in third trimester   . Menometrorrhagia 08/07/2016  . Mental disorder   . Morbid obesity (HCC)   . Pelvic pain 07/14/2018  . Psoriasis     Past Surgical History:  Procedure Laterality Date  . CESAREAN SECTION WITH BILATERAL TUBAL LIGATION Bilateral 07/20/2022   Procedure: CESAREAN SECTION WITH BILATERAL TUBAL LIGATION;  Surgeon: Christeen Douglas, MD;  Location: [facility] ORS;  Service: Obstetrics;  Laterality: Bilateral;  . INNER EAR SURGERY     right  . LAPAROSCOPY N/A 11/19/2018   Procedure: LAPAROSCOPY DIAGNOSTIC AND BIOPSY;  Surgeon: Nadara Mustard, MD;  Location: [facility];  Service: Gynecology;  Laterality: N/A;  . SHOULDER SURGERY     right    Allergies  Allergen Reactions  . Honeysuckle Flower [Lonicera] Swelling, Rash and Other (See Comments)    Other Reaction: BREAK OUT, FACIAL SWELLING, WE  . Amoxicillin Nausea And Vomiting    Did it involve swelling of the face/tongue/throat, SOB, or low BP? No Did it involve sudden or severe rash/hives, skin peeling, or any reaction on the inside of your mouth or nose? No Did you need to seek medical attention at a hospital or doctor's office? No When did it last happen?     Can tolerate but gets an upset stomach  If all above answers are "NO", may proceed with  cephalosporin use.     Prior to Admission medications   Medication Sig Start Date End Date Taking? Authorizing Provider  omeprazole (PRILOSEC) 40 MG capsule Take 40 mg by mouth daily.   Yes [provider]  Prenatal Vit-Fe Fumarate-FA (PRENATAL MULTIVITAMIN) TABS tablet Take 1 tablet by mouth daily at 12 noon.   Yes [provider]  sertraline (ZOLOFT) 50 MG tablet Take 1 tablet (50 mg total) by mouth daily. 07/23/22 07/23/23 Yes Haroldine Laws, CNM  acetaminophen (TYLENOL) 500 MG tablet Take 1 tablet (500 mg total) by mouth every 6 (six) hours as needed. 07/23/22   Haroldine Laws, CNM  ferrous sulfate 325 (65 FE) MG tablet Take 1 tablet (325 mg total) by mouth daily with breakfast. 07/23/22   Haroldine Laws, CNM  ibuprofen (ADVIL) 600 MG tablet Take 1 tablet (600 mg total) by mouth every 6 (six) hours as needed. 07/23/22   Haroldine Laws, CNM  oxyCODONE (OXY IR/ROXICODONE) 5 MG immediate release tablet Take 1 tablet (5 mg total) by mouth every 6 (six) hours as needed for severe pain. 07/23/22   Haroldine Laws, CNM  senna-docusate (SENOKOT-S) 8.6-50 MG tablet Take 2 tablets by mouth at bedtime as needed for mild constipation. 07/23/22   Haroldine Laws, CNM     Prenatal care site: [facility]   Social History: She  reports that she has  never smoked. She has never used smokeless tobacco. She reports that she does not drink alcohol and does not use drugs.  Family History: family history includes Diabetes in her maternal grandfather, mother, and paternal grandfather; Heart attack in her maternal grandmother; Hypertension in her father.   Review of Systems: A full review of systems was performed and negative except as noted in the HPI.     Physical Exam:  Vital Signs: BP 117/73 (BP Location: Left Arm)   Pulse 91   Temp 98.6 F (37 C)   Resp 20   Ht 5\' 7"  (1.702 m)   Wt 130.6 kg   LMP 11/10/2021   SpO2 99%   Breastfeeding Unknown   BMI 45.11 kg/m  General:  no acute distress.  HEENT: normocephalic, atraumatic Heart: regular rate & rhythm.  No murmurs/rubs/gallops Lungs: clear to auscultation bilaterally, normal respiratory effort Abdomen: soft, gravid, non-tender;  EFW: 5lb and 6lb Pelvic:   External: Normal external female genitalia  Cervix: Dilation: 4 / Effacement (%): 50 / Station: Ballotable    Extremities: non-tender, symmetric, mild edema bilaterally.  DTRs: +2  Neurologic: Alert & oriented x 3.    No results found for this or any previous visit (from the past 24 hour(s)).  Pertinent Results:  Prenatal Labs: Blood type/Rh A pos  Antibody screen neg  Rubella Immune  Varicella Immune  RPR NR  HBsAg Neg  HIV NR  GC neg  Chlamydia neg  Genetic screening negative  1 hour GTT   3 hour GTT   GBS    FHT: TOCO: SVE:  Dilation: 4 / Effacement (%): 50 / Station: Agricultural consultant by [employer]  [place] MFM FETAL BPP WO NON STRESS  Result Date: 07/17/2022 ----------------------------------------------------------------------  OBSTETRICS REPORT                       (Signed Final 07/17/2022 10:57 am) ---------------------------------------------------------------------- Patient Info  ID #:       [id number]                          D.O.B.:  09/25/1995 (26 yrs)  Name:       Isabel Higgins                 Visit Date: 07/17/2022 09:27 am ---------------------------------------------------------------------- Performed By  Attending:        Lin Landsman      Ref. Address:     [facility]                    MD                                                              [place], [place]                                                             [address]  Performed By:     Eden Lathe BS      [facility]                    RDMS RVT                                 Fetal Care at                                                             [facility]  Referred By:      [facility]                    MCVEY  ---------------------------------------------------------------------- Orders  #  Description                           Code        Ordered By  1  [place] MFM FETAL BPP WO NON               76819.01    BURK SCHAIBLE     STRESS  2  [place] MFM FETAL BPP WO NST               76819.1     [facility]     ADDL GESTATION ----------------------------------------------------------------------  #  Order #                     Accession #                Episode #  1  [id number]                   [id number]                 [id number]  2  [id number]                   [id number]                 [id number] ---------------------------------------------------------------------- Indications  Twin pregnancy, di/di, third trimester         O30.043  Maternal arrhythmia affecting pregnancy,       O99.413, I49.9  third trimester  Gestational diabetes in pregnancy,             O24.415  controlled by oral hypoglycemic drugs  Obesity complicating pregnancy, third          O99.213  trimester  Poor obstetric history: Previous               O09.299  preeclampsia / eclampsia/gestational HTN  [redacted] weeks gestation of pregnancy                Z3A.35 ---------------------------------------------------------------------- Fetal Evaluation (Fetus A)  Num Of Fetuses:         2  Fetal Heart Rate(bpm):  121  Cardiac Activity:       Observed  Fetal Lie:              Lower Right Fetus  Presentation:           Breech  Placenta:               Posterior  P. Cord Insertion:  Previously visualized  Membrane Desc:      Dividing Membrane seen - Dichorionic.  Amniotic Fluid  AFI FV:      Subjectively upper-normal                              Largest Pocket(cm)                              8.86 ---------------------------------------------------------------------- Biophysical Evaluation (Fetus A)  Amniotic F.V:   Pocket => 2 cm             F. Tone:        Observed  F. Movement:    Observed                   Score:          8/8  F. Breathing:   Observed  ---------------------------------------------------------------------- OB History  Gravidity:    3         Term:   2        Prem:   0        SAB:   0  TOP:          0       Ectopic:  0        Living: 2 ---------------------------------------------------------------------- Gestational Age (Fetus A)  LMP:           35w 4d        Date:  11/10/21                  EDD:   08/17/22  Best:          35w 1d     Det. ByMarcella Dubs         EDD:   08/20/22                                      (01/09/22) ---------------------------------------------------------------------- Fetal Evaluation (Fetus B)  Num Of Fetuses:         2  Fetal Heart Rate(bpm):  158  Cardiac Activity:       Observed  Fetal Lie:              Upper Left Fetus  Presentation:           Transverse, head to maternal left  Placenta:               Posterior  P. Cord Insertion:      Previously visualized  Membrane Desc:      Dividing Membrane seen - Dichorionic.  Amniotic Fluid  AFI FV:      Within normal limits                              Largest Pocket(cm)                              3.63 ---------------------------------------------------------------------- Biophysical Evaluation (Fetus B)  Amniotic F.V:   Within normal limits       F. Tone:        Observed  F. Movement:    Observed  Score:          8/8  F. Breathing:   Observed ---------------------------------------------------------------------- Gestational Age (Fetus B)  LMP:           35w 4d        Date:  11/10/21                  EDD:   08/17/22  Best:          35w 1d     Det. ByMarcella Dubs         EDD:   08/20/22                                      (01/09/22) ---------------------------------------------------------------------- Impression  Antenatal testing performed given diamniotic dichorioniic twin  pregnancy with known GDM.  The biophysical profile was 8/8 with good fetal movement and  amniotic fluid volume for both [name] & B.  ---------------------------------------------------------------------- Recommendations  Planned delivery 5/6 ----------------------------------------------------------------------              Lin Landsman, MD Electronically Signed Final Report   07/17/2022 10:57 am ----------------------------------------------------------------------  [place] MFM FETAL BPP WO NST ADDL GESTATION  Result Date: 07/17/2022 ----------------------------------------------------------------------  OBSTETRICS REPORT                       (Signed Final 07/17/2022 10:57 am) ---------------------------------------------------------------------- Patient Info  ID #:       [id number]                          D.O.B.:  03-04-1996 (26 yrs)  Name:       Isabel Higgins                 Visit Date: 07/17/2022 09:27 am ---------------------------------------------------------------------- Performed By  Attending:        Lin Landsman      Ref. Address:     [facility]                    MD                                                              [place], [place]                                                             [address]  Performed By:     Eden Lathe BS      Location[facility]         [facility]                    RDMS RVT                                 Fetal Care at  [facility]  Referred By:      REBECCA A                    MCVEY ---------------------------------------------------------------------- Orders  #  Description                           Code        Ordered By  1  [place] MFM FETAL BPP WO NON               76819.01    BURK SCHAIBLE     STRESS  2  [place] MFM FETAL BPP WO NST               76819.1     [facility]     ADDL GESTATION ----------------------------------------------------------------------  #  Order #                     Accession #                Episode #  1  [id number]                   [id number]                 [id number]  2  [id number]                    [id number]                 [id number] ---------------------------------------------------------------------- Indications  Twin pregnancy, di/di, third trimester         O30.043  Maternal arrhythmia affecting pregnancy,       O99.413, I49.9  third trimester  Gestational diabetes in pregnancy,             O24.415  controlled by oral hypoglycemic drugs  Obesity complicating pregnancy, third          O99.213  trimester  Poor obstetric history: Previous               O09.299  preeclampsia / eclampsia/gestational HTN  [redacted] weeks gestation of pregnancy                Z3A.35 ---------------------------------------------------------------------- Fetal Evaluation (Fetus A)  Num Of Fetuses:         2  Fetal Heart Rate(bpm):  121  Cardiac Activity:       Observed  Fetal Lie:              Lower Right Fetus  Presentation:           Breech  Placenta:               Posterior  P. Cord Insertion:      Previously visualized  Membrane Desc:      Dividing Membrane seen - Dichorionic.  Amniotic Fluid  AFI FV:      Subjectively upper-normal                              Largest Pocket(cm)                              8.86 ---------------------------------------------------------------------- Biophysical Evaluation (Fetus A)  Amniotic F.V:   Pocket => 2 cm             F. Tone:  Observed  F. Movement:    Observed                   Score:          8/8  F. Breathing:   Observed ---------------------------------------------------------------------- OB History  Gravidity:    3         Term:   2        Prem:   0        SAB:   0  TOP:          0       Ectopic:  0        Living: 2 ---------------------------------------------------------------------- Gestational Age (Fetus A)  LMP:           35w 4d        Date:  11/10/21                  EDD:   08/17/22  Best:          35w 1d     Det. ByMarcella Dubs         EDD:   08/20/22                                      (01/09/22)  ---------------------------------------------------------------------- Fetal Evaluation (Fetus B)  Num Of Fetuses:         2  Fetal Heart Rate(bpm):  158  Cardiac Activity:       Observed  Fetal Lie:              Upper Left Fetus  Presentation:           Transverse, head to maternal left  Placenta:               Posterior  P. Cord Insertion:      Previously visualized  Membrane Desc:      Dividing Membrane seen - Dichorionic.  Amniotic Fluid  AFI FV:      Within normal limits                              Largest Pocket(cm)                              3.63 ---------------------------------------------------------------------- Biophysical Evaluation (Fetus B)  Amniotic F.V:   Within normal limits       F. Tone:        Observed  F. Movement:    Observed                   Score:          8/8  F. Breathing:   Observed ---------------------------------------------------------------------- Gestational Age (Fetus B)  LMP:           35w 4d        Date:  11/10/21                  EDD:   08/17/22  Best:          35w 1d     Det. ByMarcella Dubs         EDD:   08/20/22                                      (  01/09/22) ---------------------------------------------------------------------- Impression  Antenatal testing performed given diamniotic dichorioniic twin  pregnancy with known GDM.  The biophysical profile was 8/8 with good fetal movement and  amniotic fluid volume for both [name] & B. ---------------------------------------------------------------------- Recommendations  Planned delivery 5/6 ----------------------------------------------------------------------              Lin Landsman, MD Electronically Signed Final Report   07/17/2022 10:57 am ----------------------------------------------------------------------  [place] MFM FETAL BPP WO NON STRESS  Result Date: 07/10/2022 ----------------------------------------------------------------------  OBSTETRICS REPORT                       (Signed Final 07/10/2022 12:59  pm) ---------------------------------------------------------------------- Patient Info  ID #:       [id number]                          D.O.B.:  [birth year] (26 yrs)  Name:       Isabel Higgins                 Visit Date: 07/10/2022 11:29 am ---------------------------------------------------------------------- Performed By  Attending:        Ma Rings MD         Ref. Address:     [facility]                                                              [place], [place]                                                             [address]  Performed By:     Eden Lathe BS      Location[facility]         [facility]                    RDMS RVT                                 Fetal Care at                                                             [facility]  Referred By:      [facility]                    MCVEY ---------------------------------------------------------------------- Orders  #  Description                           Code        Ordered By  1  [place] MFM FETAL BPP WO NON               19147.82    YU FANG     STRESS  2  [place] MFM  FETAL BPP WO NST               76819.1     [facility]     ADDL GESTATION ----------------------------------------------------------------------  #  Order #                     Accession #                Episode #  1  [id number]                   [id number]                 [id number]  2  [id number]                   [id number]                 [id number] ---------------------------------------------------------------------- Indications  Twin pregnancy, di/di, third trimester         O30.043  Maternal arrhythmia affecting pregnancy,       O99.413, I49.9  third trimester  Gestational diabetes in pregnancy,             O24.415  controlled by oral hypoglycemic drugs  Obesity complicating pregnancy, third          O99.213  trimester  Poor obstetric history: Previous               O09.299  preeclampsia / eclampsia/gestational HTN  [redacted] weeks gestation of pregnancy                Z3A.34  ---------------------------------------------------------------------- Fetal Evaluation (Fetus A)  Num Of Fetuses:         2  Fetal Heart Rate(bpm):  129  Cardiac Activity:       Observed  Fetal Lie:              Lower Right Fetus  Presentation:           Breech  Placenta:               Anterior  P. Cord Insertion:      Previously visualized  Membrane Desc:      Dividing Membrane seen - Dichorionic.  Amniotic Fluid  AFI FV:      Within normal limits                              Largest Pocket(cm)                              7.03 ---------------------------------------------------------------------- Biophysical Evaluation (Fetus A)  Amniotic F.V:   Within normal limits       F. Tone:        Observed  F. Movement:    Observed                   Score:          8/8  F. Breathing:   Observed ---------------------------------------------------------------------- OB History  Gravidity:    3         Term:   2        Prem:   0        SAB:   0  TOP:          0       Ectopic:  0  Living: 2 ---------------------------------------------------------------------- Gestational Age (Fetus A)  LMP:           34w 4d        Date:  11/10/21                  EDD:   08/17/22  Best:          34w 1d     Det. ByMarcella Dubs         EDD:   08/20/22                                      (01/09/22) ---------------------------------------------------------------------- Anatomy (Fetus A)  Stomach:               Appears normal, left   Bladder:                Appears normal                         sided  Kidneys:               Appear normal ---------------------------------------------------------------------- Fetal Evaluation (Fetus B)  Num Of Fetuses:         2  Fetal Heart Rate(bpm):  161  Cardiac Activity:       Observed  Fetal Lie:              Upper Left Fetus  Presentation:           Transverse, head to maternal left  Placenta:               Anterior Fundal  P. Cord Insertion:      Previously visualized  Membrane Desc:      Dividing  Membrane seen - Dichorionic.  Amniotic Fluid  AFI FV:      Within normal limits                              Largest Pocket(cm)                              3.92 ---------------------------------------------------------------------- Biophysical Evaluation (Fetus B)  Amniotic F.V:   Within normal limits       F. Tone:        Observed  F. Movement:    Observed                   Score:          8/8  F. Breathing:   Observed ---------------------------------------------------------------------- Gestational Age (Fetus B)  LMP:           34w 4d        Date:  11/10/21                  EDD:   08/17/22  Best:          34w 1d     Det. ByMarcella Dubs         EDD:   08/20/22                                      (01/09/22) ---------------------------------------------------------------------- Anatomy (Fetus B)  Stomach:  Appears normal, left   Bladder:                Appears normal                         sided  Kidneys:               Appear normal ---------------------------------------------------------------------- Comments  Elly Modena is currently at 34 weeks and 1 day.  She was  seen for a BPP due to a dichorionic, diamniotic twin gestation  and gestational diabetes treated with insulin and metformin.  She is currently treated with NPH insulin 37 units in the  morning and 35 units in the evening along with regular insulin  22 units in the morning and 22 units in the evening along with  metformin 500 mg in the AM and 1000 mg in the PM.  She has required increasing doses of insulin to help her  achieve better glycemic control.  Her insulin dose was just  increased yesterday.  She reports that her fingerstick values  have been in the high 80s to low 90s range and her 2-hour  postprandial fingerstick values have been in the 120s to 130s  range.  Her hemoglobin A1c drawn yesterday was 6.2%.  She is undergoing twice-weekly fetal testing.  A BPP performed today was 8 out of 8 for both [name] and  twin B.  There  was normal amniotic fluid noted around both fetuses  today.  Both fetuses are in the breech/breech presentations.  Due to a dichorionic twin gestation with gestational diabetes  that is difficult to control, she is already scheduled for a  cesarean delivery on Jul 29, 2022 (at around 37 weeks).  The patient was advised that delivery at around 37 weeks is  reasonable as long as her fetal testing remains reassuring.  Delivery prior to 37 weeks will be recommended should she  go into the spontaneous labor, rupture membranes, or at any  time for nonreassuring fetal status.  She should continue twice-weekly fetal testing until delivery.  The patient will have a BPP performed in our office one day  each week and will have an NST performed in your office on  another day.  Her insulin dose should continue to be adjusted in order to  help her achieve the best possible glycemic control.  She will return to our office in 1 week for another BPP.  The patient stated that all of her questions were answered.  A total of 20 minutes was spent counseling and coordinating  the care for this patient.  Greater than 50% of the time was  spent in direct face-to-face contact. ----------------------------------------------------------------------                   Ma Rings, MD Electronically Signed Final Report   07/10/2022 12:59 pm ----------------------------------------------------------------------  [place] MFM FETAL BPP WO NST ADDL GESTATION  Result Date: 07/10/2022 ----------------------------------------------------------------------  OBSTETRICS REPORT                       (Signed Final 07/10/2022 12:59 pm) ---------------------------------------------------------------------- Patient Info  ID #:       [id number]                          D.O.B.:  27-Oct-1995 (26 yrs)  Name:       Isabel Higgins  Visit Date: 07/10/2022 11:29 am ---------------------------------------------------------------------- Performed By  Attending:         Ma Rings MD         Ref. Address:     [facility]                                                              [place], [place]                                                             [address]  Performed By:     Eden Lathe BS      Location[facility]         [facility]                    RDMS RVT                                 Fetal Care at                                                             [facility]  Referred By:      [facility] A                    MCVEY ---------------------------------------------------------------------- Orders  #  Description                           Code        Ordered By  1  [place] MFM FETAL BPP WO NON               76819.01    YU FANG     STRESS  2  [place] MFM FETAL BPP WO NST               52841.3     YU FANG     ADDL GESTATION ----------------------------------------------------------------------  #  Order #                     Accession #                Episode #  1  [id number]                   [id number]                 [id number]  2  [id number]                   [id number]                 [id number] ---------------------------------------------------------------------- Indications  Twin pregnancy, di/di, third trimester         O30.043  Maternal arrhythmia affecting pregnancy,  O99.413, I49.9  third trimester  Gestational diabetes in pregnancy,             O24.415  controlled by oral hypoglycemic drugs  Obesity complicating pregnancy, third          O99.213  trimester  Poor obstetric history: Previous               O09.299  preeclampsia / eclampsia/gestational HTN  [redacted] weeks gestation of pregnancy                Z3A.34 ---------------------------------------------------------------------- Fetal Evaluation (Fetus A)  Num Of Fetuses:         2  Fetal Heart Rate(bpm):  129  Cardiac Activity:       Observed  Fetal Lie:              Lower Right Fetus  Presentation:           Breech  Placenta:               Anterior  P. Cord Insertion:      Previously visualized   Membrane Desc:      Dividing Membrane seen - Dichorionic.  Amniotic Fluid  AFI FV:      Within normal limits                              Largest Pocket(cm)                              7.03 ---------------------------------------------------------------------- Biophysical Evaluation (Fetus A)  Amniotic F.V:   Within normal limits       F. Tone:        Observed  F. Movement:    Observed                   Score:          8/8  F. Breathing:   Observed ---------------------------------------------------------------------- OB History  Gravidity:    3         Term:   2        Prem:   0        SAB:   0  TOP:          0       Ectopic:  0        Living: 2 ---------------------------------------------------------------------- Gestational Age (Fetus A)  LMP:           34w 4d        Date:  11/10/21                  EDD:   08/17/22  Best:          34w 1d     Det. By:  Marcella Dubs         EDD:   08/20/22                                      (01/09/22) ---------------------------------------------------------------------- Anatomy (Fetus A)  Stomach:               Appears normal, left   Bladder:                Appears normal  sided  Kidneys:               Appear normal ---------------------------------------------------------------------- Fetal Evaluation (Fetus B)  Num Of Fetuses:         2  Fetal Heart Rate(bpm):  161  Cardiac Activity:       Observed  Fetal Lie:              Upper Left Fetus  Presentation:           Transverse, head to maternal left  Placenta:               Anterior Fundal  P. Cord Insertion:      Previously visualized  Membrane Desc:      Dividing Membrane seen - Dichorionic.  Amniotic Fluid  AFI FV:      Within normal limits                              Largest Pocket(cm)                              3.92 ---------------------------------------------------------------------- Biophysical Evaluation (Fetus B)  Amniotic F.V:   Within normal limits       F. Tone:        Observed  F.  Movement:    Observed                   Score:          8/8  F. Breathing:   Observed ---------------------------------------------------------------------- Gestational Age (Fetus B)  LMP:           34w 4d        Date:  11/10/21                  EDD:   08/17/22  Best:          34w 1d     Det. By:  Marcella Dubs         EDD:   08/20/22                                      (01/09/22) ---------------------------------------------------------------------- Anatomy (Fetus B)  Stomach:               Appears normal, left   Bladder:                Appears normal                         sided  Kidneys:               Appear normal ---------------------------------------------------------------------- Comments  Elly Modena is currently at 34 weeks and 1 day.  She was  seen for a BPP due to a dichorionic, diamniotic twin gestation  and gestational diabetes treated with insulin and metformin.  She is currently treated with NPH insulin 37 units in the  morning and 35 units in the evening along with regular insulin  22 units in the morning and 22 units in the evening along with  metformin 500 mg in the AM and 1000 mg in the PM.  She has required increasing doses of insulin to help her  achieve better glycemic control.  Her insulin dose was just  increased yesterday.  She reports that her fingerstick values  have been in the high 80s to low 90s range and her 2-hour  postprandial fingerstick values have been in the 120s to 130s  range.  Her hemoglobin A1c drawn yesterday was 6.2%.  She is undergoing twice-weekly fetal testing.  A BPP performed today was 8 out of 8 for both [name] and  twin B.  There was normal amniotic fluid noted around both fetuses  today.  Both fetuses are in the breech/breech presentations.  Due to a dichorionic twin gestation with gestational diabetes  that is difficult to control, she is already scheduled for a  cesarean delivery on Jul 29, 2022 (at around 37 weeks).  The patient was advised that delivery at  around 37 weeks is  reasonable as long as her fetal testing remains reassuring.  Delivery prior to 37 weeks will be recommended should she  go into the spontaneous labor, rupture membranes, or at any  time for nonreassuring fetal status.  She should continue twice-weekly fetal testing until delivery.  The patient will have a BPP performed in our office one day  each week and will have an NST performed in your office on  another day.  Her insulin dose should continue to be adjusted in order to  help her achieve the best possible glycemic control.  She will return to our office in 1 week for another BPP.  The patient stated that all of her questions were answered.  A total of 20 minutes was spent counseling and coordinating  the care for this patient.  Greater than 50% of the time was  spent in direct face-to-face contact. ----------------------------------------------------------------------                   Ma Rings, MD Electronically Signed Final Report   07/10/2022 12:59 pm ----------------------------------------------------------------------   Assessment:  ANGLIQUE VIVENZIO is a 27 y.o. [phone] female at [redacted]w[redacted]d with ***.   Plan:  1. Admit to Labor & Delivery; consents reviewed and obtained  2. Fetal Well being  - Fetal Tracing: *** - Group B Streptococcus ppx indicated: *** - Presentation: *** confirmed by ***   3. Routine OB: - Prenatal labs reviewed, as above - Rh *** - CBC, T&S, RPR on admit - Clear fluids, IVF***saline lock  4. Monitoring of Labor***Induction of Labor -  Contractions ***external toco in place -  Pelvis proven to *** -  Plan for induction with *** -  Plan for continuous fetal monitoring  -  Maternal pain control as desired; requesting ***IVPM, nitrous, regional anesthesia - Anticipate vaginal delivery  5. Post Partum Planning: - Infant feeding: *** - Contraception: ***  Janyce Llanos, CNM 08/04/22 12:02 AM

## 2022-08-04 NOTE — H&P (Signed)
OB History & Physical   History of Present Illness:  Chief Complaint:   HPI:  Isabel Higgins is a 27 y.o. [phone] female at [redacted]w[redacted]d dated by LMP.  She presents to L&D for uterine contractions that are getting stronger and closer together and feel like labor. Reports good fetal movement, denies leaking fluid.    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 Humira for psoriasis    Maternal Medical History:   Past Medical History:  Diagnosis Date   Bradycardia    Depression    GERD (gastroesophageal reflux disease)    Gestational diabetes    Hemorrhoids during pregnancy in third trimester    Menometrorrhagia 08/07/2016   Mental disorder    Morbid obesity (HCC)    Pelvic pain 07/14/2018   Psoriasis     Past Surgical History:  Procedure Laterality Date   CESAREAN SECTION WITH BILATERAL TUBAL LIGATION Bilateral 07/20/2022   Procedure: CESAREAN SECTION WITH BILATERAL TUBAL LIGATION;  Surgeon: Christeen Douglas, MD;  Location: [facility];  Service: Obstetrics;  Laterality: Bilateral;   INNER EAR SURGERY     right   LAPAROSCOPY N/A 11/19/2018   Procedure: LAPAROSCOPY DIAGNOSTIC AND BIOPSY;  Surgeon: Nadara Mustard, MD;  Location: [facility];  Service: Gynecology;  Laterality: N/A;   SHOULDER SURGERY     right    Allergies  Allergen Reactions   Honeysuckle Flower [Lonicera] Swelling, Rash and Other (See Comments)    Other Reaction: BREAK OUT, FACIAL SWELLING, WE   Amoxicillin Nausea And Vomiting    Did it involve swelling of the face/tongue/throat, SOB, or low BP? No Did it involve sudden or severe rash/hives, skin peeling, or any reaction on the inside of your mouth or nose? No Did you need to seek medical attention at a hospital or doctor's office? No When did it last happen?     Can tolerate but gets an upset stomach  If all above answers are "NO", may proceed with cephalosporin use.      Prior to Admission medications   Medication Sig Start Date End Date Taking? Authorizing Provider  omeprazole (PRILOSEC) 40 MG capsule Take 40 mg by mouth daily.   Yes [provider]  Prenatal Vit-Fe Fumarate-FA (PRENATAL MULTIVITAMIN) TABS tablet Take 1 tablet by mouth daily at 12 noon.   Yes [provider]  sertraline (ZOLOFT) 50 MG tablet Take 1 tablet (50 mg total) by mouth daily. 07/23/22 07/23/23 Yes Haroldine Laws, CNM  acetaminophen (TYLENOL) 500 MG tablet Take 1 tablet (500 mg total) by mouth every 6 (six) hours as needed. 07/23/22   Haroldine Laws, CNM  ferrous sulfate 325 (65 FE) MG tablet Take 1 tablet (325 mg total) by mouth daily with breakfast. 07/23/22   Haroldine Laws, CNM  ibuprofen (ADVIL) 600 MG tablet Take 1 tablet (600 mg total) by mouth every 6 (six) hours as needed. 07/23/22   Haroldine Laws, CNM  oxyCODONE (OXY IR/ROXICODONE) 5 MG immediate release tablet Take 1 tablet (5 mg total) by mouth every 6 (six) hours as needed for severe pain. 07/23/22   Haroldine Laws, CNM  senna-docusate (SENOKOT-S) 8.6-50 MG tablet Take 2 tablets by mouth at bedtime as needed for mild constipation. 07/23/22   Haroldine Laws, CNM     Prenatal care site: [facility]   Social History: She  reports that she has  never smoked. She has never used smokeless tobacco. She reports that she does not drink alcohol and does not use drugs.  Family History: family history includes Diabetes in her maternal grandfather, mother, and paternal grandfather; Heart attack in her maternal grandmother; Hypertension in her father.   Review of Systems: A full review of systems was performed and negative except as noted in the HPI.     Physical Exam:  Vital Signs: BP 117/73 (BP Location: Left Arm)   Pulse 91   Temp 98.6 F (37 C)   Resp 20   Ht 5\' 7"  (1.702 m)   Wt 130.6 kg   LMP 11/10/2021   SpO2 99%   Breastfeeding Unknown   BMI 45.11 kg/m  General: no acute distress.   HEENT: normocephalic, atraumatic Heart: regular rate & rhythm.  No murmurs/rubs/gallops Lungs: clear to auscultation bilaterally, normal respiratory effort Abdomen: soft, gravid, non-tender;  EFW: 5lb and 6lb Pelvic:   External: Normal external female genitalia  Cervix: Dilation: 4 / Effacement (%): 50 / Station: Ballotable    Extremities: non-tender, symmetric, mild edema bilaterally.  DTRs: +2  Neurologic: Alert & oriented x 3.    No results found for this or any previous visit (from the past 24 hour(s)).  Pertinent Results:  Prenatal Labs: Blood type/Rh A pos  Antibody screen neg  Rubella Immune  Varicella Immune  RPR NR  HBsAg Neg  HIV NR  GC neg  Chlamydia neg  Genetic screening negative  1 hour GTT 193  3 hour GTT [phone]   GBS Not collected yet   FHT: Baby A - 120bpm, moderate variability, accelerations present, no decelerations, Category I tracing Baby B - 135bpm, moderate variability, accelerations present, no decelerations, Category I tracing TOCO: Contractions q3-48min, moderate to palpation SVE:  Dilation: 4 / Effacement (%): 50 / Station: Agricultural consultant by [employer]  [place] MFM FETAL BPP WO NON STRESS  Result Date: 07/17/2022 ----------------------------------------------------------------------  OBSTETRICS REPORT                       (Signed Final 07/17/2022 10:57 am) ---------------------------------------------------------------------- Patient Info  ID #:       [id number]                          D.O.B.:  03/26/1995 (26 yrs)  Name:       Isabel Higgins                 Visit Date: 07/17/2022 09:27 am ---------------------------------------------------------------------- Performed By  Attending:        Lin Landsman      Ref. Address:     [facility]                    MD                                                              [place], [place]  [address]  Performed By:     Eden Lathe BS       [facility]         [facility]                    RDMS RVT                                 Fetal Care at                                                             [facility]  Referred By:      [facility]                    MCVEY ---------------------------------------------------------------------- Orders  #  Description                           Code        Ordered By  1  [place] MFM FETAL BPP WO NON               76819.01    BURK SCHAIBLE     STRESS  2  [place] MFM FETAL BPP WO NST               76819.1     [facility]     ADDL GESTATION ----------------------------------------------------------------------  #  Order #                     Accession #                Episode #  1  [id number]                   [id number]                 [id number]  2  [id number]                   [id number]                 [id number] ---------------------------------------------------------------------- Indications  Twin pregnancy, di/di, third trimester         O30.043  Maternal arrhythmia affecting pregnancy,       O99.413, I49.9  third trimester  Gestational diabetes in pregnancy,             O24.415  controlled by oral hypoglycemic drugs  Obesity complicating pregnancy, third          O99.213  trimester  Poor obstetric history: Previous               O09.299  preeclampsia / eclampsia/gestational HTN  [redacted] weeks gestation of pregnancy                Z3A.35 ---------------------------------------------------------------------- Fetal Evaluation (Fetus A)  Num Of Fetuses:         2  Fetal Heart Rate(bpm):  121  Cardiac Activity:       Observed  Fetal Lie:              Lower Right Fetus  Presentation:  Breech  Placenta:               Posterior  P. Cord Insertion:      Previously visualized  Membrane Desc:      Dividing Membrane seen - Dichorionic.  Amniotic Fluid  AFI FV:      Subjectively upper-normal                              Largest Pocket(cm)                              8.86  ---------------------------------------------------------------------- Biophysical Evaluation (Fetus A)  Amniotic F.V:   Pocket => 2 cm             F. Tone:        Observed  F. Movement:    Observed                   Score:          8/8  F. Breathing:   Observed ---------------------------------------------------------------------- OB History  Gravidity:    3         Term:   2        Prem:   0        SAB:   0  TOP:          0       Ectopic:  0        Living: 2 ---------------------------------------------------------------------- Gestational Age (Fetus A)  LMP:           35w 4d        Date:  11/10/21                  EDD:   08/17/22  Best:          35w 1d     Det. ByMarcella Dubs         EDD:   08/20/22                                      (01/09/22) ---------------------------------------------------------------------- Fetal Evaluation (Fetus B)  Num Of Fetuses:         2  Fetal Heart Rate(bpm):  158  Cardiac Activity:       Observed  Fetal Lie:              Upper Left Fetus  Presentation:           Transverse, head to maternal left  Placenta:               Posterior  P. Cord Insertion:      Previously visualized  Membrane Desc:      Dividing Membrane seen - Dichorionic.  Amniotic Fluid  AFI FV:      Within normal limits                              Largest Pocket(cm)                              3.63 ---------------------------------------------------------------------- Biophysical Evaluation (Fetus B)  Amniotic F.V:   Within normal limits       F. Tone:  Observed  F. Movement:    Observed                   Score:          8/8  F. Breathing:   Observed ---------------------------------------------------------------------- Gestational Age (Fetus B)  LMP:           35w 4d        Date:  11/10/21                  EDD:   08/17/22  Best:          35w 1d     Det. ByMarcella Dubs         EDD:   08/20/22                                      (01/09/22)  ---------------------------------------------------------------------- Impression  Antenatal testing performed given diamniotic dichorioniic twin  pregnancy with known GDM.  The biophysical profile was 8/8 with good fetal movement and  amniotic fluid volume for both [name] & B. ---------------------------------------------------------------------- Recommendations  Planned delivery 5/6 ----------------------------------------------------------------------              Lin Landsman, MD Electronically Signed Final Report   07/17/2022 10:57 am ----------------------------------------------------------------------  [place] MFM FETAL BPP WO NST ADDL GESTATION  Result Date: 07/17/2022 ----------------------------------------------------------------------  OBSTETRICS REPORT                       (Signed Final 07/17/2022 10:57 am) ---------------------------------------------------------------------- Patient Info  ID #:       [id number]                          D.O.B.:  09/08/1995 (26 yrs)  Name:       Isabel Higgins                 Visit Date: 07/17/2022 09:27 am ---------------------------------------------------------------------- Performed By  Attending:        Lin Landsman      Ref. Address:     [facility]                    MD                                                              [place], [place]                                                             [address]  Performed By:     Eden Lathe BS      [facility]         [facility]                    RDMS RVT  Fetal Care at                                                             [facility]  Referred By:      [facility]                    MCVEY ---------------------------------------------------------------------- Orders  #  Description                           Code        Ordered By  1  [place] MFM FETAL BPP WO NON               76819.01    BURK SCHAIBLE     STRESS  2  [place] MFM FETAL BPP WO NST                76819.1     [facility]     ADDL GESTATION ----------------------------------------------------------------------  #  Order #                     Accession #                Episode #  1  [id number]                   [id number]                 [id number]  2  [id number]                   [id number]                 [id number] ---------------------------------------------------------------------- Indications  Twin pregnancy, di/di, third trimester         O30.043  Maternal arrhythmia affecting pregnancy,       O99.413, I49.9  third trimester  Gestational diabetes in pregnancy,             O24.415  controlled by oral hypoglycemic drugs  Obesity complicating pregnancy, third          O99.213  trimester  Poor obstetric history: Previous               O09.299  preeclampsia / eclampsia/gestational HTN  [redacted] weeks gestation of pregnancy                Z3A.35 ---------------------------------------------------------------------- Fetal Evaluation (Fetus A)  Num Of Fetuses:         2  Fetal Heart Rate(bpm):  121  Cardiac Activity:       Observed  Fetal Lie:              Lower Right Fetus  Presentation:           Breech  Placenta:               Posterior  P. Cord Insertion:      Previously visualized  Membrane Desc:      Dividing Membrane seen - Dichorionic.  Amniotic Fluid  AFI FV:      Subjectively upper-normal  Largest Pocket(cm)                              8.86 ---------------------------------------------------------------------- Biophysical Evaluation (Fetus A)  Amniotic F.V:   Pocket => 2 cm             F. Tone:        Observed  F. Movement:    Observed                   Score:          8/8  F. Breathing:   Observed ---------------------------------------------------------------------- OB History  Gravidity:    3         Term:   2        Prem:   0        SAB:   0  TOP:          0       Ectopic:  0        Living: 2 ---------------------------------------------------------------------- Gestational  Age (Fetus A)  LMP:           35w 4d        Date:  11/10/21                  EDD:   08/17/22  Best:          35w 1d     Det. ByMarcella Dubs         EDD:   08/20/22                                      (01/09/22) ---------------------------------------------------------------------- Fetal Evaluation (Fetus B)  Num Of Fetuses:         2  Fetal Heart Rate(bpm):  158  Cardiac Activity:       Observed  Fetal Lie:              Upper Left Fetus  Presentation:           Transverse, head to maternal left  Placenta:               Posterior  P. Cord Insertion:      Previously visualized  Membrane Desc:      Dividing Membrane seen - Dichorionic.  Amniotic Fluid  AFI FV:      Within normal limits                              Largest Pocket(cm)                              3.63 ---------------------------------------------------------------------- Biophysical Evaluation (Fetus B)  Amniotic F.V:   Within normal limits       F. Tone:        Observed  F. Movement:    Observed                   Score:          8/8  F. Breathing:   Observed ---------------------------------------------------------------------- Gestational Age (Fetus B)  LMP:           35w 4d        Date:  11/10/21  EDD:   08/17/22  Best:          35w 1d     Det. ByMarcella Dubs         EDD:   08/20/22                                      (01/09/22) ---------------------------------------------------------------------- Impression  Antenatal testing performed given diamniotic dichorioniic twin  pregnancy with known GDM.  The biophysical profile was 8/8 with good fetal movement and  amniotic fluid volume for both [name] & B. ---------------------------------------------------------------------- Recommendations  Planned delivery 5/6 ----------------------------------------------------------------------              Lin Landsman, MD Electronically Signed Final Report   07/17/2022 10:57 am  ----------------------------------------------------------------------  [place] MFM FETAL BPP WO NON STRESS  Result Date: 07/10/2022 ----------------------------------------------------------------------  OBSTETRICS REPORT                       (Signed Final 07/10/2022 12:59 pm) ---------------------------------------------------------------------- Patient Info  ID #:       [id number]                          D.O.B.:  1996/02/06 (26 yrs)  Name:       Isabel Higgins                 Visit Date: 07/10/2022 11:29 am ---------------------------------------------------------------------- Performed By  Attending:        Ma Rings MD         Ref. Address:     [facility]                                                              [place], [place]                                                             [address]  Performed By:     Eden Lathe BS      [facility]         [facility]                    RDMS RVT                                 Fetal Care at                                                             [facility]  Referred By:      [facility]                    MCVEY ---------------------------------------------------------------------- Orders  #  Description  Code        Ordered By  1  [place] MFM FETAL BPP WO NON               [id number]    YU FANG     STRESS  2  [place] MFM FETAL BPP WO NST               76819.1     [facility]     ADDL GESTATION ----------------------------------------------------------------------  #  Order #                     Accession #                Episode #  1  [id number]                   [id number]                 [id number]  2  [id number]                   [id number]                 [id number] ---------------------------------------------------------------------- Indications  Twin pregnancy, di/di, third trimester         O30.043  Maternal arrhythmia affecting pregnancy,       O99.413, I49.9  third trimester  Gestational diabetes in pregnancy,              O24.415  controlled by oral hypoglycemic drugs  Obesity complicating pregnancy, third          O99.213  trimester  Poor obstetric history: Previous               O09.299  preeclampsia / eclampsia/gestational HTN  [redacted] weeks gestation of pregnancy                Z3A.34 ---------------------------------------------------------------------- Fetal Evaluation (Fetus A)  Num Of Fetuses:         2  Fetal Heart Rate(bpm):  129  Cardiac Activity:       Observed  Fetal Lie:              Lower Right Fetus  Presentation:           Breech  Placenta:               Anterior  P. Cord Insertion:      Previously visualized  Membrane Desc:      Dividing Membrane seen - Dichorionic.  Amniotic Fluid  AFI FV:      Within normal limits                              Largest Pocket(cm)                              7.03 ---------------------------------------------------------------------- Biophysical Evaluation (Fetus A)  Amniotic F.V:   Within normal limits       F. Tone:        Observed  F. Movement:    Observed                   Score:          8/8  F. Breathing:   Observed ---------------------------------------------------------------------- OB History  Gravidity:    3         Term:  2        Prem:   0        SAB:   0  TOP:          0       Ectopic:  0        Living: 2 ---------------------------------------------------------------------- Gestational Age (Fetus A)  LMP:           34w 4d        Date:  11/10/21                  EDD:   08/17/22  Best:          34w 1d     Det. ByMarcella Dubs         EDD:   08/20/22                                      (01/09/22) ---------------------------------------------------------------------- Anatomy (Fetus A)  Stomach:               Appears normal, left   Bladder:                Appears normal                         sided  Kidneys:               Appear normal ---------------------------------------------------------------------- Fetal Evaluation (Fetus B)  Num Of Fetuses:         2  Fetal Heart  Rate(bpm):  161  Cardiac Activity:       Observed  Fetal Lie:              Upper Left Fetus  Presentation:           Transverse, head to maternal left  Placenta:               Anterior Fundal  P. Cord Insertion:      Previously visualized  Membrane Desc:      Dividing Membrane seen - Dichorionic.  Amniotic Fluid  AFI FV:      Within normal limits                              Largest Pocket(cm)                              3.92 ---------------------------------------------------------------------- Biophysical Evaluation (Fetus B)  Amniotic F.V:   Within normal limits       F. Tone:        Observed  F. Movement:    Observed                   Score:          8/8  F. Breathing:   Observed ---------------------------------------------------------------------- Gestational Age (Fetus B)  LMP:           34w 4d        Date:  11/10/21                  EDD:   08/17/22  Best:          34w 1d     Det. By:  Marcella Dubs  EDD:   08/20/22                                      (01/09/22) ---------------------------------------------------------------------- Anatomy (Fetus B)  Stomach:               Appears normal, left   Bladder:                Appears normal                         sided  Kidneys:               Appear normal ---------------------------------------------------------------------- Comments  Elly Modena is currently at 34 weeks and 1 day.  She was  seen for a BPP due to a dichorionic, diamniotic twin gestation  and gestational diabetes treated with insulin and metformin.  She is currently treated with NPH insulin 37 units in the  morning and 35 units in the evening along with regular insulin  22 units in the morning and 22 units in the evening along with  metformin 500 mg in the AM and 1000 mg in the PM.  She has required increasing doses of insulin to help her  achieve better glycemic control.  Her insulin dose was just  increased yesterday.  She reports that her fingerstick values  have been in the high 80s  to low 90s range and her 2-hour  postprandial fingerstick values have been in the 120s to 130s  range.  Her hemoglobin A1c drawn yesterday was 6.2%.  She is undergoing twice-weekly fetal testing.  A BPP performed today was 8 out of 8 for both [name] and  twin B.  There was normal amniotic fluid noted around both fetuses  today.  Both fetuses are in the breech/breech presentations.  Due to a dichorionic twin gestation with gestational diabetes  that is difficult to control, she is already scheduled for a  cesarean delivery on Jul 29, 2022 (at around 37 weeks).  The patient was advised that delivery at around 37 weeks is  reasonable as long as her fetal testing remains reassuring.  Delivery prior to 37 weeks will be recommended should she  go into the spontaneous labor, rupture membranes, or at any  time for nonreassuring fetal status.  She should continue twice-weekly fetal testing until delivery.  The patient will have a BPP performed in our office one day  each week and will have an NST performed in your office on  another day.  Her insulin dose should continue to be adjusted in order to  help her achieve the best possible glycemic control.  She will return to our office in 1 week for another BPP.  The patient stated that all of her questions were answered.  A total of 20 minutes was spent counseling and coordinating  the care for this patient.  Greater than 50% of the time was  spent in direct face-to-face contact. ----------------------------------------------------------------------                   Ma Rings, MD Electronically Signed Final Report   07/10/2022 12:59 pm ----------------------------------------------------------------------  [place] MFM FETAL BPP WO NST ADDL GESTATION  Result Date: 07/10/2022 ----------------------------------------------------------------------  OBSTETRICS REPORT                       (Signed Final 07/10/2022  12:59 pm)  ---------------------------------------------------------------------- Patient Info  ID #:       [id number]                          D.O.B.:  1995-08-08 (26 yrs)  Name:       Isabel Higgins                 Visit Date: 07/10/2022 11:29 am ---------------------------------------------------------------------- Performed By  Attending:        Ma Rings MD         Ref. Address:     [facility]                                                              [place], [place]                                                             [address]  Performed By:     Eden Lathe BS      Location[facility]         [facility]                    RDMS RVT                                 Fetal Care at                                                             [facility]  Referred By:      [facility]                    MCVEY ---------------------------------------------------------------------- Orders  #  Description                           Code        Ordered By  1  [place] MFM FETAL BPP WO NON               76819.01    YU FANG     STRESS  2  [place] MFM FETAL BPP WO NST               19147.8     Rosana Hoes     ADDL GESTATION ----------------------------------------------------------------------  #  Order #                     Accession #                Episode #  1  [id number]                   [id number]  [id number]  2  [id number]                   [id number]                 [id number] ---------------------------------------------------------------------- Indications  Twin pregnancy, di/di, third trimester         O30.043  Maternal arrhythmia affecting pregnancy,       O99.413, I49.9  third trimester  Gestational diabetes in pregnancy,             O24.415  controlled by oral hypoglycemic drugs  Obesity complicating pregnancy, third          O99.213  trimester  Poor obstetric history: Previous               O09.299  preeclampsia / eclampsia/gestational HTN  [redacted] weeks gestation of pregnancy                Z3A.34  ---------------------------------------------------------------------- Fetal Evaluation (Fetus A)  Num Of Fetuses:         2  Fetal Heart Rate(bpm):  129  Cardiac Activity:       Observed  Fetal Lie:              Lower Right Fetus  Presentation:           Breech  Placenta:               Anterior  P. Cord Insertion:      Previously visualized  Membrane Desc:      Dividing Membrane seen - Dichorionic.  Amniotic Fluid  AFI FV:      Within normal limits                              Largest Pocket(cm)                              7.03 ---------------------------------------------------------------------- Biophysical Evaluation (Fetus A)  Amniotic F.V:   Within normal limits       F. Tone:        Observed  F. Movement:    Observed                   Score:          8/8  F. Breathing:   Observed ---------------------------------------------------------------------- OB History  Gravidity:    3         Term:   2        Prem:   0        SAB:   0  TOP:          0       Ectopic:  0        Living: 2 ---------------------------------------------------------------------- Gestational Age (Fetus A)  LMP:           34w 4d        Date:  11/10/21                  EDD:   08/17/22  Best:          34w 1d     Det. ByMarcella Dubs         EDD:   08/20/22                                      (  01/09/22) ---------------------------------------------------------------------- Anatomy (Fetus A)  Stomach:               Appears normal, left   Bladder:                Appears normal                         sided  Kidneys:               Appear normal ---------------------------------------------------------------------- Fetal Evaluation (Fetus B)  Num Of Fetuses:         2  Fetal Heart Rate(bpm):  161  Cardiac Activity:       Observed  Fetal Lie:              Upper Left Fetus  Presentation:           Transverse, head to maternal left  Placenta:               Anterior Fundal  P. Cord Insertion:      Previously visualized  Membrane Desc:      Dividing  Membrane seen - Dichorionic.  Amniotic Fluid  AFI FV:      Within normal limits                              Largest Pocket(cm)                              3.92 ---------------------------------------------------------------------- Biophysical Evaluation (Fetus B)  Amniotic F.V:   Within normal limits       F. Tone:        Observed  F. Movement:    Observed                   Score:          8/8  F. Breathing:   Observed ---------------------------------------------------------------------- Gestational Age (Fetus B)  LMP:           34w 4d        Date:  11/10/21                  EDD:   08/17/22  Best:          34w 1d     Det. By:  Marcella Dubs         EDD:   08/20/22                                      (01/09/22) ---------------------------------------------------------------------- Anatomy (Fetus B)  Stomach:               Appears normal, left   Bladder:                Appears normal                         sided  Kidneys:               Appear normal ---------------------------------------------------------------------- Comments  Elly Modena is currently at 34 weeks and 1 day.  She was  seen for a BPP due to a dichorionic, diamniotic twin gestation  and gestational diabetes treated with insulin and metformin.  She is currently treated with NPH insulin 37  units in the  morning and 35 units in the evening along with regular insulin  22 units in the morning and 22 units in the evening along with  metformin 500 mg in the AM and 1000 mg in the PM.  She has required increasing doses of insulin to help her  achieve better glycemic control.  Her insulin dose was just  increased yesterday.  She reports that her fingerstick values  have been in the high 80s to low 90s range and her 2-hour  postprandial fingerstick values have been in the 120s to 130s  range.  Her hemoglobin A1c drawn yesterday was 6.2%.  She is undergoing twice-weekly fetal testing.  A BPP performed today was 8 out of 8 for both [name] and  twin B.  There  was normal amniotic fluid noted around both fetuses  today.  Both fetuses are in the breech/breech presentations.  Due to a dichorionic twin gestation with gestational diabetes  that is difficult to control, she is already scheduled for a  cesarean delivery on Jul 29, 2022 (at around 37 weeks).  The patient was advised that delivery at around 37 weeks is  reasonable as long as her fetal testing remains reassuring.  Delivery prior to 37 weeks will be recommended should she  go into the spontaneous labor, rupture membranes, or at any  time for nonreassuring fetal status.  She should continue twice-weekly fetal testing until delivery.  The patient will have a BPP performed in our office one day  each week and will have an NST performed in your office on  another day.  Her insulin dose should continue to be adjusted in order to  help her achieve the best possible glycemic control.  She will return to our office in 1 week for another BPP.  The patient stated that all of her questions were answered.  A total of 20 minutes was spent counseling and coordinating  the care for this patient.  Greater than 50% of the time was  spent in direct face-to-face contact. ----------------------------------------------------------------------                   Ma Rings, MD Electronically Signed Final Report   07/10/2022 12:59 pm ----------------------------------------------------------------------   Assessment:  Isabel Higgins is a 27 y.o. [phone] female at [redacted]w[redacted]d with active labor.   Plan:  1. Admit to Labor & Delivery; consents reviewed and obtained - Dr. Dalbert Garnet notified of admission  2. Fetal Well being  - Fetal Tracing: Category I tracing - Group B Streptococcus ppx indicated: n/a, intact and requiring a cesarean section - Presentation: Baby A is breech confirmed by SVE, Baby B is vertex   3. Routine OB: - Prenatal labs reviewed, as above - Rh positive - CBC, T&S, RPR on admit - Clear fluids, IVF  4. Primary  cesarean section: - notified Dr. Dalbert Garnet that Baby A is breech and mom is laboring. Decision made for primary cesarean section. - SCDs - NPO since last night  5. Post Partum Planning: - Infant feeding: breastfeeding - Contraception: BTL  Janyce Llanos, CNM 08/04/22 12:02 AM

## 2022-09-05 ENCOUNTER — Telehealth: Payer: Self-pay

## 2022-09-05 NOTE — Telephone Encounter (Signed)
[facility]- Discharge Call [facility] to patient on the phone about the following below. 1-Do you have any questions or concerns about yourself as you heal?No 2-Any concerns or questions about your babies?No Are your babies eating, peeing,pooping well?Yes 3- Reviewed ABC's of safe sleep. 4-How was your stay at the hospital?Great 5- Did our team work together to care for you?Yes You should be receiving a survey in the mail soon.   We would really appreciate it if you could fill that out for [place] and return it in the mail.  We value the feedback to make improvements and continue the great work we do.   If you have any questions please feel free to call me back at [phone]

## 2022-10-31 IMAGING — CT CT ABD-PELV W/ CM
2 of 4 series · 16 of 46 positions shown, 18 images · IV contrast (APPLIED)
Comparison: 9870

CLINICAL DATA: Abdominal pain, acute, nonlocalized lower abd

EXAM:
CT ABDOMEN AND PELVIS WITH CONTRAST
TECHNIQUE: Multidetector CT imaging of the abdomen and pelvis was performed
using the standard protocol following bolus administration of
intravenous contrast.

[Series 3: abdomen 5.0 · axial · 0.93mm/px · z∈[-1184,-734]mm · 13 of 104 slices shown, 15 images]
[im 7/104  soft-tissue]
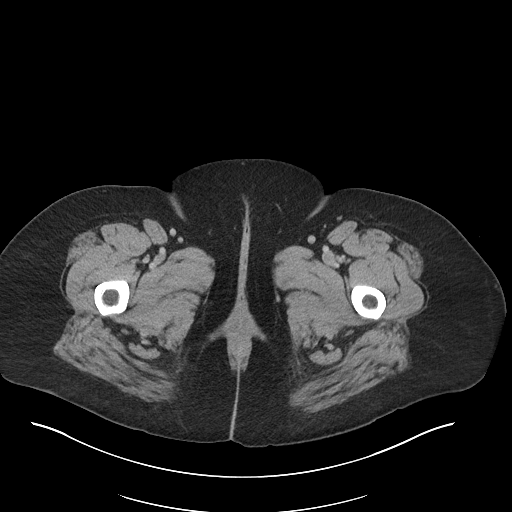
[im 7/104  bone]
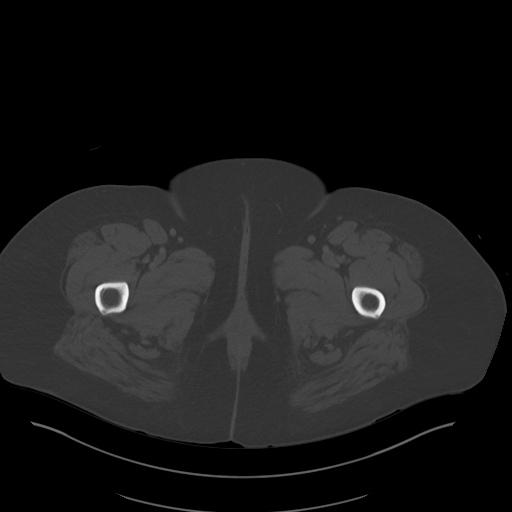
[im 13/104  soft-tissue]
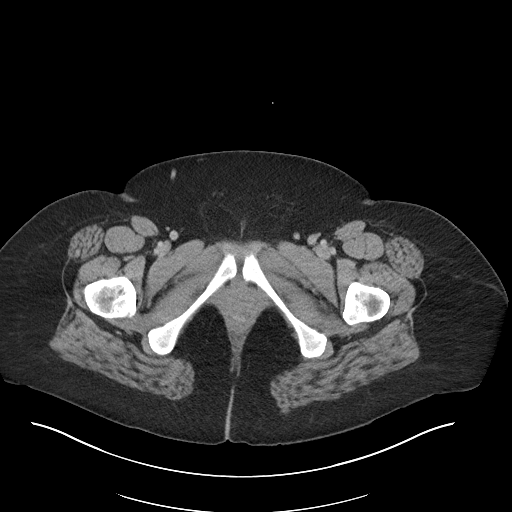
[im 25/104  soft-tissue]
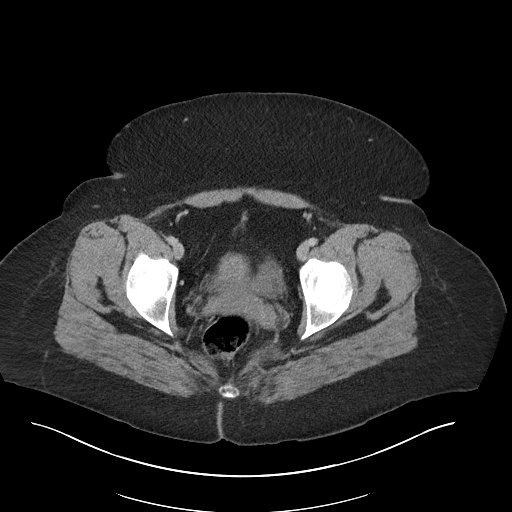
[im 31/104  soft-tissue]
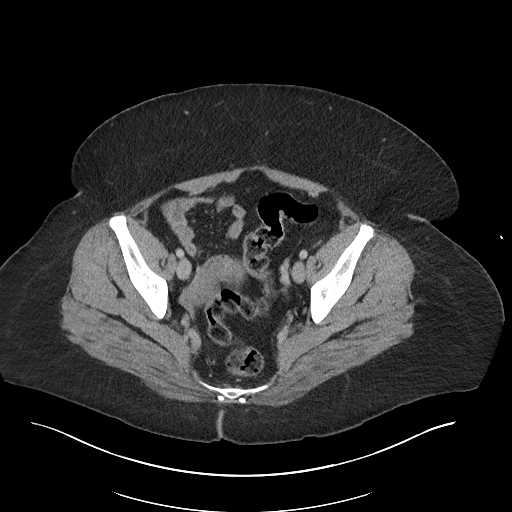
[im 37/104  soft-tissue]
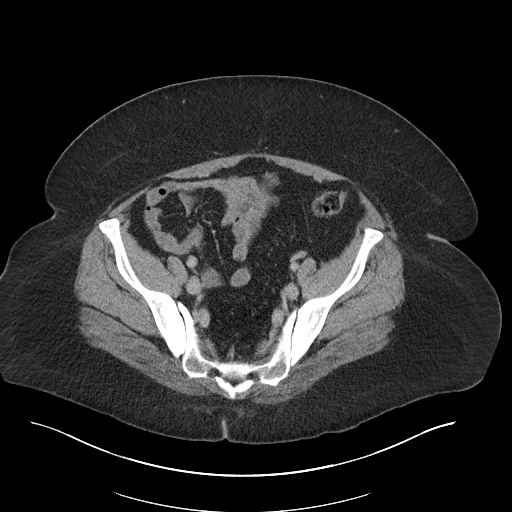
[im 43/104  soft-tissue]
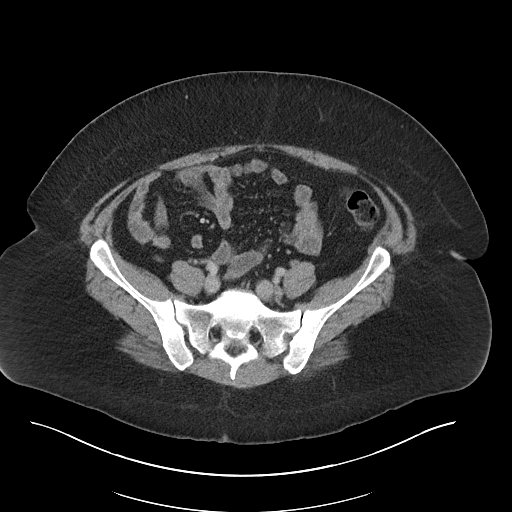
[im 55/104  soft-tissue]
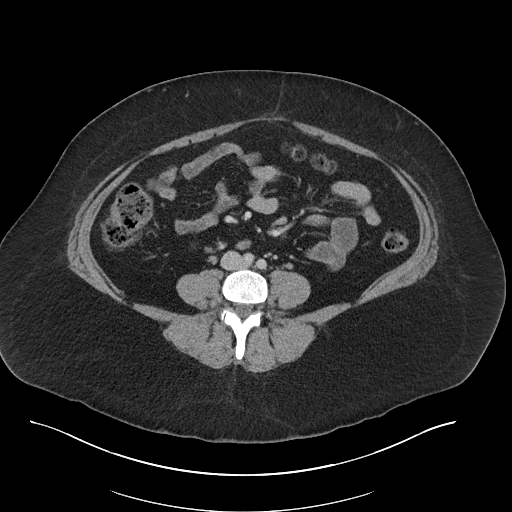
[im 61/104  soft-tissue]
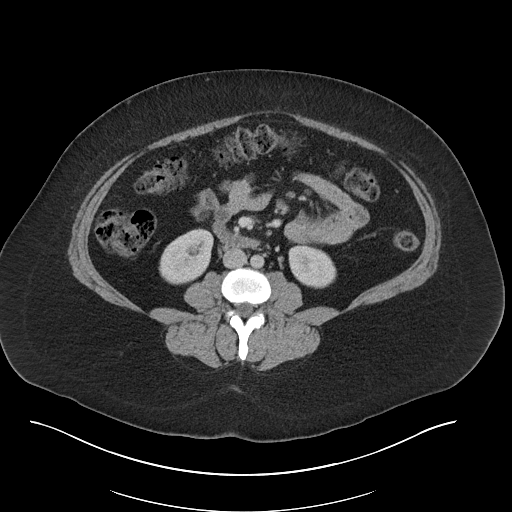
[im 67/104  soft-tissue]
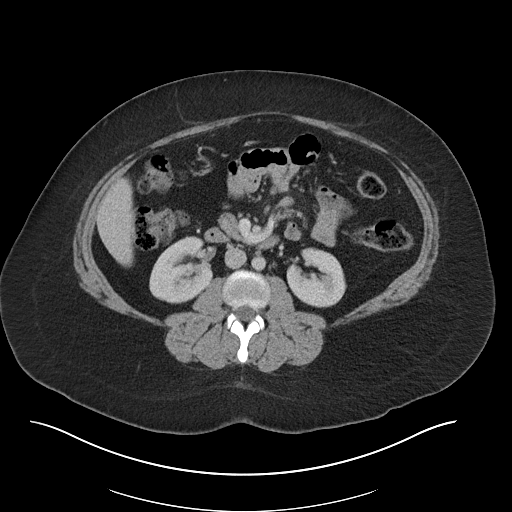
[im 67/104  bone]
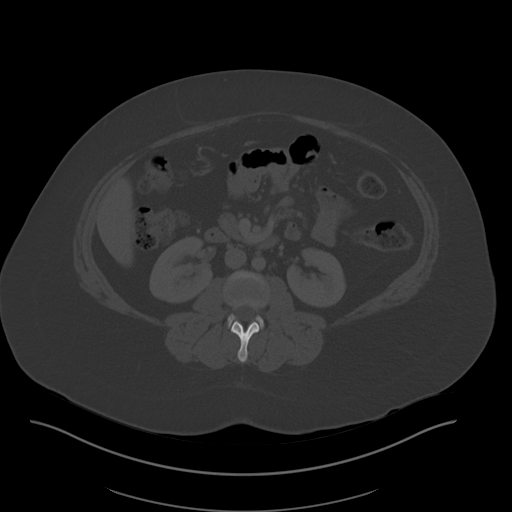
[im 73/104  soft-tissue]
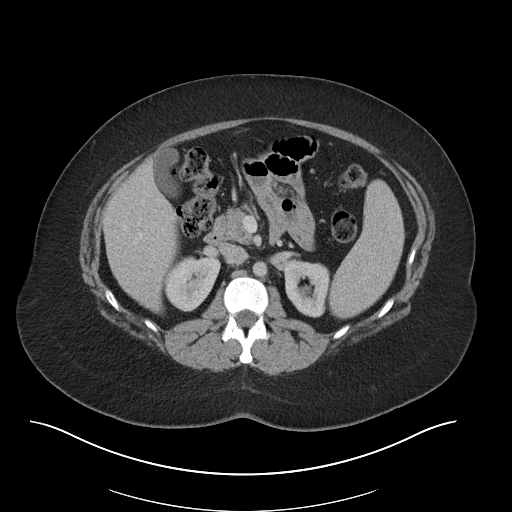
[im 79/104  soft-tissue]
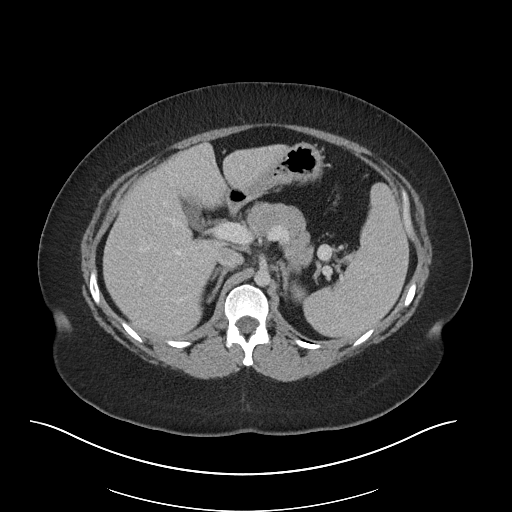
[im 91/104  soft-tissue]
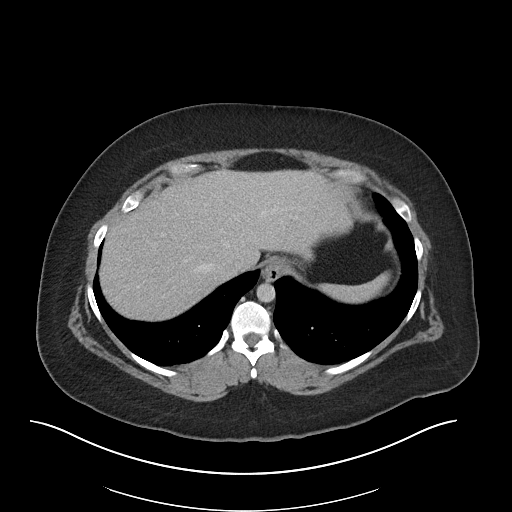
[im 97/104  soft-tissue]
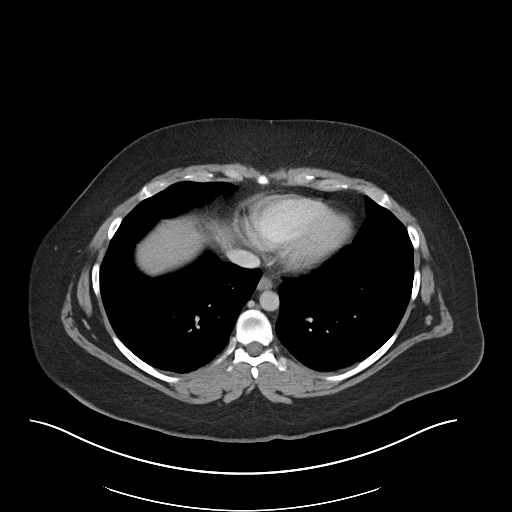

[Series 6: abdomen 3.0 mpr cor · coronal · 0.76mm/px · 3 of 116 slices shown]
[im 39/116  soft-tissue]
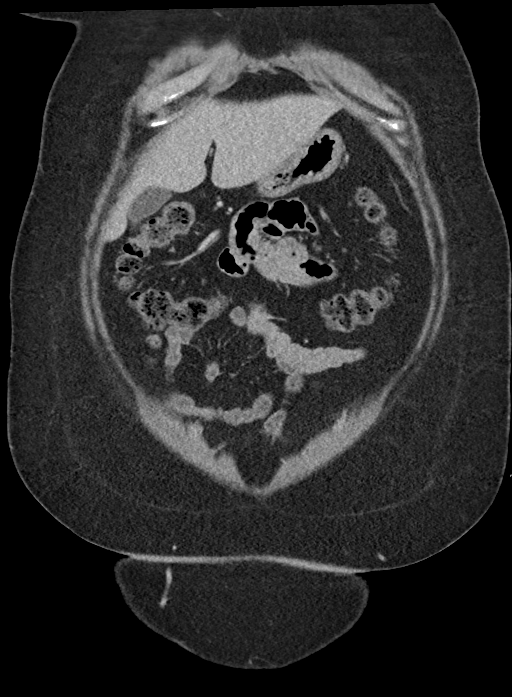
[im 52/116  soft-tissue]
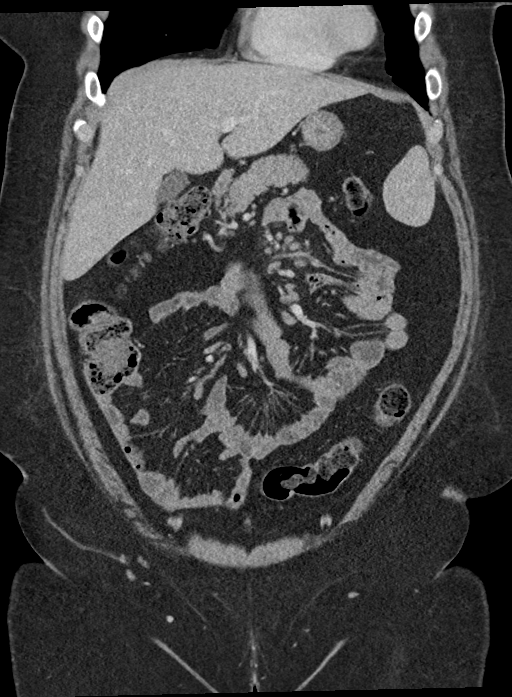
[im 64/116  soft-tissue]
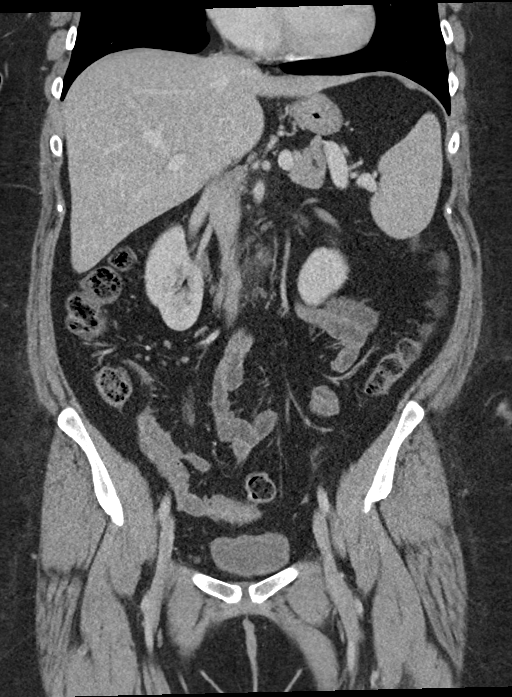

[16 of 46 positions shown; findings below may reference images not displayed]

RADIATION DOSE REDUCTION: This exam was performed according to the
departmental dose-optimization program which includes automated
exposure control, adjustment of the mA and/or kV according to
patient size and/or use of iterative reconstruction technique.

CONTRAST:  100mL OMNIPAQUE IOHEXOL 350 MG/ML SOLN
FINDINGS: Lower chest: No acute abnormality.

Hepatobiliary: No focal liver abnormality is seen. No gallstones,
gallbladder wall thickening, or biliary dilatation.

Pancreas: Unremarkable. No pancreatic ductal dilatation or
surrounding inflammatory changes.

Spleen: Normal in size without focal abnormality.

Adrenals/Urinary Tract: Adrenals are unremarkable. Kidneys are
unremarkable. Partially distended bladder is unremarkable.

Stomach/Bowel: Stomach is within limits. Bowel is normal in caliber.
Normal appendix.

Vascular/Lymphatic: No significant vascular abnormality. No enlarged
nodes.

Reproductive: Intrauterine device is present.  No adnexal mass.

Other: No free fluid.  Abdominal wall is unremarkable.

Musculoskeletal: No acute osseous abnormality.
IMPRESSION: No acute abnormality.

## 2023-07-14 ENCOUNTER — Other Ambulatory Visit: Payer: Self-pay | Admitting: Obstetrics

## 2023-07-14 DIAGNOSIS — O30043 Twin pregnancy, dichorionic/diamniotic, third trimester: Secondary | ICD-10-CM

## 2023-07-14 DIAGNOSIS — O99213 Obesity complicating pregnancy, third trimester: Secondary | ICD-10-CM

## 2023-07-14 DIAGNOSIS — O0990 Supervision of high risk pregnancy, unspecified, unspecified trimester: Secondary | ICD-10-CM

## 2023-07-14 DIAGNOSIS — Z8759 Personal history of other complications of pregnancy, childbirth and the puerperium: Secondary | ICD-10-CM

## 2023-07-14 DIAGNOSIS — O24414 Gestational diabetes mellitus in pregnancy, insulin controlled: Secondary | ICD-10-CM

## 2024-02-10 ENCOUNTER — Encounter: Payer: Self-pay | Admitting: Obstetrics and Gynecology

## 2024-02-10 ENCOUNTER — Ambulatory Visit (INDEPENDENT_AMBULATORY_CARE_PROVIDER_SITE_OTHER): Admitting: Obstetrics and Gynecology

## 2024-02-10 VITALS — BP 105/71 | HR 61 | Ht 67.32 in | Wt 263.4 lb

## 2024-02-10 DIAGNOSIS — N393 Stress incontinence (female) (male): Secondary | ICD-10-CM | POA: Diagnosis not present

## 2024-02-10 DIAGNOSIS — N3281 Overactive bladder: Secondary | ICD-10-CM | POA: Insufficient documentation

## 2024-02-10 DIAGNOSIS — N816 Rectocele: Secondary | ICD-10-CM | POA: Insufficient documentation

## 2024-02-10 DIAGNOSIS — R350 Frequency of micturition: Secondary | ICD-10-CM | POA: Insufficient documentation

## 2024-02-10 DIAGNOSIS — R102 Pelvic and perineal pain unspecified side: Secondary | ICD-10-CM

## 2024-02-10 DIAGNOSIS — Z6841 Body Mass Index (BMI) 40.0 and over, adult: Secondary | ICD-10-CM

## 2024-02-10 LAB — POCT URINALYSIS DIP (CLINITEK)
Bilirubin, UA: NEGATIVE
Blood, UA: NEGATIVE
Glucose, UA: NEGATIVE mg/dL
Ketones, POC UA: NEGATIVE mg/dL
Leukocytes, UA: NEGATIVE
Nitrite, UA: NEGATIVE
POC PROTEIN,UA: NEGATIVE
Spec Grav, UA: 1.025 (ref 1.010–1.025)
Urobilinogen, UA: 1 U/dL
pH, UA: 6.5 (ref 5.0–8.0)

## 2024-02-10 NOTE — Patient Instructions (Addendum)
 You have a stage 2 (out of 4) prolapse.  We discussed the fact that it is not life threatening but there are several treatment options. For treatment of pelvic organ prolapse, we discussed options for management including expectant management, conservative management, and surgical management, such as Kegels, a pessary, pelvic floor physical therapy, and specific surgical procedures.    For treatment of stress urinary incontinence, which is leakage with physical activity/movement/strainging/coughing, we discussed expectant management versus nonsurgical options versus surgery. Nonsurgical options include weight loss, physical therapy, as well as a pessary.  Surgical options include a midurethral sling, which is a synthetic mesh sling that acts like a hammock under the urethra to prevent leakage of urine,  and transurethral injection of a bulking agent.  We discussed the symptoms of overactive bladder (OAB), which include urinary urgency, urinary frequency, night-time urination, with or without urge incontinence.  We discussed management including behavioral therapy (decreasing bladder irritants by following a bladder diet, urge suppression strategies, timed voids, bladder retraining), physical therapy, medication; and for refractory cases posterior tibial nerve stimulation, sacral neuromodulation, and intravesical botulinum toxin injection.

## 2024-02-10 NOTE — Progress Notes (Signed)
 New Patient Evaluation and Consultation  Referring Provider: Harvey Gaetana CROME, NP PCP: Harvey Gaetana CROME, NP Date of Service: 02/10/2024  SUBJECTIVE Chief Complaint: New Patient (Initial Visit) (Artesia G Schall is a 28 y.o. female hre today for female organ prolapse.)  History of Present Illness: Ella G Zinni is a 28 y.o. White or Caucasian female presenting for evaluation of prolapse.     Urinary Symptoms: Leaks urine with cough/ sneeze, laughing, lifting, with a full bladder, with movement to the bathroom, and with urgency Leaks 2 time(s) per day. SUI = UUI Pad use: none Patient is bothered by UI symptoms.  Day time voids 8-10.  Nocturia: 2-3 times per night to void. Voiding dysfunction:  does not empty bladder well.  Patient does not use a catheter to empty bladder.  When urinating, patient feels dribbling after finishing and the need to urinate multiple times in a row Drinks: abt 75oz water per day, occasional soda  UTIs: 1 UTI's in the last year.   Denies history of blood in urine and kidney or bladder stones   Pelvic Organ Prolapse Symptoms:                  Patient Admits to a feeling of a bulge the vaginal area. It has been present for 7 years - has been worsening since her first delivery.  Patient denies seeing a bulge.  This bulge is bothersome. Feels it all the time. Has difficulty putting a tampon in.  When she was pregnant with her second pregnancy, she did some pelvic PT.   Bowel Symptom: Bowel movements: 5-6 time(s) per day Stool consistency: loose Straining: no.  Splinting: yes.  Incomplete evacuation: yes.  Patient Admits to accidental bowel leakage / fecal incontinence  Occurs: has happened maybe 1-2 times  Consistency with leakage: liquid Bowel regimen: none Last colonoscopy: never had one Has referral to GI pending  Sexual Function Sexually active: yes.  Sexual orientation: heterosexual Pain with sex: Yes, deep in the pelvis, has discomfort  due to prolapse  Pelvic Pain Admits to pelvic pain- started after her last delivery Location: around uterus and ovaries, also around the tailbone and radiates around Pain occurs: all the time Prior pain treatment: none Improved by: nothing Worsened by: standing/ sitting She has seen a chiropractor without improvement.    Past Medical History:  Past Medical History:  Diagnosis Date   Bradycardia    Depression    GERD (gastroesophageal reflux disease)    Hemorrhoids during pregnancy in third trimester    Menometrorrhagia 08/07/2016   Mental disorder    Migraines    Morbid obesity (HCC)    Pelvic pain 07/14/2018   Psoriasis      Past Surgical History:   Past Surgical History:  Procedure Laterality Date   CESAREAN SECTION WITH BILATERAL TUBAL LIGATION Bilateral 07/20/2022   Procedure: CESAREAN SECTION WITH BILATERAL TUBAL LIGATION;  Surgeon: Verdon Keen, MD;  Location: [facility] ORS;  Service: Obstetrics;  Laterality: Bilateral;   INNER EAR SURGERY     right   LAPAROSCOPY N/A 11/19/2018   Procedure: LAPAROSCOPY DIAGNOSTIC AND BIOPSY;  Surgeon: Arloa Lamar SQUIBB, MD;  Location: [facility] ORS;  Service: Gynecology;  Laterality: N/A;   SHOULDER SURGERY     right     Past OB/GYN History: OB History  Gravida Para Term Preterm AB Living  3 3 2 1  0 4  SAB IAB Ectopic Multiple Live Births     1 4    # Outcome  Date GA Lbr Len/2nd Weight Sex Type Anes PTL Lv  3A Preterm 07/20/22 [redacted]w[redacted]d  5 lb 12.8 oz (2.63 kg) F CS-LTranv Spinal  LIV  3B Preterm 07/20/22 [redacted]w[redacted]d  4 lb 14.3 oz (2.22 kg) M CS-LTranv Spinal  LIV  2 Term 10/09/19 [redacted]w[redacted]d 04:56 / 00:10 9 lb 3.1 oz (4.17 kg) M Vag-Spont EPI  LIV     Birth Comments: bruise noted on left lower arm; scrapes noted on RLE  1 Term 05/05/16 [redacted]w[redacted]d 09:30 / 01:11 9 lb 13.3 oz (4.46 kg) M Vag-Spont EPI  LIV     Birth Comments: none observed at delivery    Contraception: tubal ligation.    Component Value Date/Time   DIAGPAP  03/22/2021 1611    -  Negative for intraepithelial lesion or malignancy (NILM)   DIAGPAP  11/22/2019 9081    - Negative for intraepithelial lesion or malignancy (NILM)   DIAGPAP  09/21/2018 0000    NEGATIVE FOR INTRAEPITHELIAL LESIONS OR MALIGNANCY.   ADEQPAP  03/22/2021 1611    Satisfactory for evaluation; transformation zone component PRESENT.   ADEQPAP  11/22/2019 0918    Satisfactory for evaluation; transformation zone component PRESENT.   ADEQPAP  09/21/2018 0000    Satisfactory for evaluation  endocervical/transformation zone component PRESENT.    Medications: Patient has a current medication list which includes the following prescription(s): acetaminophen , ferrous sulfate , ibuprofen , metformin, omeprazole, propranolol, sertraline , and venlafaxine  xr.   Allergies: Patient is allergic to honeysuckle flower [lonicera] and amoxicillin .   Social History:  Social History   Tobacco Use   Smoking status: Never   Smokeless tobacco: Never  Vaping Use   Vaping status: Never Used  Substance Use Topics   Alcohol use: No    Alcohol/week: 0.0 standard drinks of alcohol   Drug use: No    Relationship status: married Patient lives with her husband and kids.   Patient is not employed. Regular exercise: No History of abuse: No  Family History:   Family History  Problem Relation Age of Onset   Diabetes Mother    Hypertension Father    Heart attack Maternal Grandmother        x5   Diabetes Maternal Grandfather    Diabetes Paternal Grandfather    Bladder Cancer Neg Hx    Renal cancer Neg Hx    Uterine cancer Neg Hx      Review of Systems: Review of Systems  Constitutional:  Positive for malaise/fatigue. Negative for fever and weight loss.  Respiratory:  Negative for cough, shortness of breath and wheezing.   Cardiovascular:  Positive for palpitations. Negative for chest pain and leg swelling.  Gastrointestinal:  Positive for abdominal pain. Negative for blood in stool.  Genitourinary:  Negative  for dysuria.  Musculoskeletal:  Negative for myalgias.  Skin:  Positive for rash.  Neurological:  Positive for dizziness and headaches.  Endo/Heme/Allergies:  Bruises/bleeds easily.  Psychiatric/Behavioral:  Positive for depression. The patient is nervous/anxious.      OBJECTIVE Physical Exam: Vitals:   02/10/24 1055  BP: 105/71  Pulse: 61  Weight: 263 lb 6.4 oz (119.5 kg)  Height: 5' 7.32 (1.71 m)    Physical Exam Vitals reviewed. Exam conducted with a chaperone present.  Constitutional:      General: She is not in acute distress. Pulmonary:     Effort: Pulmonary effort is normal.  Abdominal:     General: There is no distension.     Palpations: Abdomen is soft.  Tenderness: There is no abdominal tenderness. There is no rebound.     Comments: 4 fingerbreadth diastasis with coning in her upper abdomen  Musculoskeletal:        General: No swelling. Normal range of motion.  Skin:    General: Skin is warm and dry.     Findings: No rash.  Neurological:     Mental Status: She is alert and oriented to person, place, and time.  Psychiatric:        Mood and Affect: Mood normal.        Behavior: Behavior normal.      GU / Detailed Urogynecologic Evaluation:  Pelvic Exam: Normal external female genitalia; Bartholin's and Skene's glands normal in appearance; urethral meatus normal in appearance, no urethral masses or discharge.   CST: negative  Speculum exam reveals normal vaginal mucosa without atrophy. Cervix normal appearance. Uterus normal single, nontender. Adnexa no mass, fullness, tenderness.     Pelvic floor strength II/V, puborectalis II/V external anal sphincter II/V  Pelvic floor musculature: Right levator non-tender, Right obturator non-tender, Left levator non-tender, Left obturator non-tender  POP-Q:   POP-Q  -2                                            Aa   -2                                           Ba  -9.5                                               C   5                                            Gh  5                                            Pb  12                                            tvl   0                                            Ap  0                                            Bp  -11  D      Rectal Exam:  Normal sphincter tone, large distal rectocele, enterocoele present, no rectal masses, no sign of dyssynergia when asking the patient to bear down.  Post-Void Residual (PVR) by Bladder Scan: In order to evaluate bladder emptying, we discussed obtaining a postvoid residual and patient agreed to this procedure.  Procedure: The ultrasound unit was placed on the patient's abdomen in the suprapubic region after the patient had voided.    Post Void Residual - 02/10/24 1124       Post Void Residual   Post Void Residual 10 mL           Laboratory Results: Lab Results  Component Value Date   COLORU yellow 02/10/2024   CLARITYU clear 02/10/2024   GLUCOSEUR negative 02/10/2024   BILIRUBINUR negative 02/10/2024   KETONESU Negative 09/21/2018   SPECGRAV 1.025 02/10/2024   RBCUR negative 02/10/2024   PHUR 6.5 02/10/2024   PROTEINUR NEGATIVE 05/28/2022   UROBILINOGEN 1.0 02/10/2024   LEUKOCYTESUR Negative 02/10/2024    Lab Results  Component Value Date   CREATININE 0.75 07/20/2022   CREATININE 0.54 06/17/2022   CREATININE 0.54 06/06/2022    No results found for: HGBA1C  Lab Results  Component Value Date   HGB 9.1 (L) 07/21/2022     ASSESSMENT AND PLAN Ms. Vanblarcom is a 28 y.o. with:  1. Prolapse of posterior vaginal wall   2. Overactive bladder   3. Urinary frequency   4. SUI (stress urinary incontinence, female)   5. Pelvic pain   6. BMI 40.0-44.9, adult (HCC)     Prolapse of posterior vaginal wall Assessment & Plan: Stage II Posterior prolapse - For treatment of pelvic organ prolapse, we discussed options for management  including expectant management, conservative management, and surgical management, such as Kegels, a pessary, pelvic floor physical therapy, and specific surgical procedures. - I do not feel that she is optimized for surgery at this time. Recommended weight loss to achieve best long term success. Also recommended further investigation/ management of pelvic pain prior to pursuing surgery as surgery may worsen/ complicate symptoms.  - Prior notes have also discussed anterior vaginal wall/ uterine prolapse but no significant prolapse in these compartments was noted today. Can reexamine in the future if considering surgery.    Overactive bladder Assessment & Plan: We discussed the symptoms of overactive bladder (OAB), which include urinary urgency, urinary frequency, nocturia, with or without urge incontinence.  While we do not know the exact etiology of OAB, several treatment options exist. We discussed management including behavioral therapy (decreasing bladder irritants, urge suppression strategies, timed voids, bladder retraining), physical therapy, medication.  - She would like to start with pelvic PT, referral placed to [facility] rehab   Orders: -     AMB referral to rehabilitation  Urinary frequency -     POCT URINALYSIS DIP (CLINITEK)  SUI (stress urinary incontinence, female) Assessment & Plan: - For treatment of stress urinary incontinence,  non-surgical options include expectant management, weight loss, physical therapy, as well as a pessary.  Surgical options include a midurethral sling, and transurethral injection of a bulking agent. - We discussed that weight loss would improve outcomes of surgical procedures.  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 Pt also reports history of blood filled cysts.  -  Will  send to Dr Jeralyn, [facility] for further management of pelvic pain/ possible endometriosis.  - Referral also placed to pelvic PT as she also has diastasis/ core weakness in addition to pelvic pain  Orders: -     US  PELVIC COMPLETE WITH TRANSVAGINAL; Future -     AMB referral to rehabilitation -     Ambulatory referral to Obstetrics / Gynecology  BMI 40.0-44.9, adult (HCC) -     Amb Ref to Medical Weight Management  Return 4 months for follow up   Rosaline LOISE Caper, MD

## 2024-02-10 NOTE — Assessment & Plan Note (Signed)
 We discussed the symptoms of overactive bladder (OAB), which include urinary urgency, urinary frequency, nocturia, with or without urge incontinence.  While we do not know the exact etiology of OAB, several treatment options exist. We discussed management including behavioral therapy (decreasing bladder irritants, urge suppression strategies, timed voids, bladder retraining), physical therapy, medication.  - She would like to start with pelvic PT, referral placed to [facility] rehab

## 2024-02-10 NOTE — Assessment & Plan Note (Addendum)
-   Will order pelvic US  - Has history of diagnostic laparoscopy with one biopsy which showed endosalpingiosis. Pt also reports history of blood filled cysts.  - Will send to Dr Jeralyn, [facility] for further management of pelvic pain/ possible endometriosis.  - Referral also placed to pelvic PT as she also has diastasis/ core weakness in addition to pelvic pain

## 2024-02-10 NOTE — Assessment & Plan Note (Signed)
 Stage II Posterior prolapse - For treatment of pelvic organ prolapse, we discussed options for management including expectant management, conservative management, and surgical management, such as Kegels, a pessary, pelvic floor physical therapy, and specific surgical procedures. - I do not feel that she is optimized for surgery at this time. Recommended weight loss to achieve best long term success. Also recommended further investigation/ management of pelvic pain prior to pursuing surgery as surgery may worsen/ complicate symptoms.  - Prior notes have also discussed anterior vaginal wall/ uterine prolapse but no significant prolapse in these compartments was noted today. Can reexamine in the future if considering surgery.

## 2024-02-10 NOTE — Assessment & Plan Note (Signed)
-   For treatment of stress urinary incontinence,  non-surgical options include expectant management, weight loss, physical therapy, as well as a pessary.  Surgical options include a midurethral sling, and transurethral injection of a bulking agent. - We discussed that weight loss would improve outcomes of surgical procedures.  - She will start with pelvic PT

## 2024-02-12 ENCOUNTER — Encounter (INDEPENDENT_AMBULATORY_CARE_PROVIDER_SITE_OTHER): Payer: Self-pay

## 2024-02-18 ENCOUNTER — Ambulatory Visit: Admitting: Obstetrics and Gynecology

## 2024-02-26 ENCOUNTER — Encounter: Payer: Self-pay | Admitting: Otolaryngology

## 2024-02-27 ENCOUNTER — Other Ambulatory Visit: Payer: Self-pay | Admitting: Otolaryngology

## 2024-02-27 DIAGNOSIS — H838X1 Other specified diseases of right inner ear: Secondary | ICD-10-CM

## 2024-03-01 ENCOUNTER — Encounter: Payer: Self-pay | Admitting: Otolaryngology

## 2024-03-12 ENCOUNTER — Inpatient Hospital Stay: Admission: RE | Admit: 2024-03-12 | Discharge: 2024-03-12 | Attending: Otolaryngology | Admitting: Otolaryngology

## 2024-03-12 DIAGNOSIS — H838X1 Other specified diseases of right inner ear: Secondary | ICD-10-CM

## 2024-03-12 MED ORDER — GADOPICLENOL 0.5 MMOL/ML IV SOLN
10.0000 mL | Freq: Once | INTRAVENOUS | Status: AC | PRN
  Administered 2024-03-12: 10 mL via INTRAVENOUS

## 2024-04-05 ENCOUNTER — Encounter: Payer: Self-pay | Admitting: *Deleted

## 2024-04-20 ENCOUNTER — Ambulatory Visit

## 2024-04-23 NOTE — Therapy (Incomplete)
 " OUTPATIENT PHYSICAL THERAPY FEMALE PELVIC EVALUATION   Patient Name: Isabel Higgins MRN: [id number] DOB:[birth year], 29 y.o., female Today's Date: 04/23/2024  END OF SESSION:   Past Medical History:  Diagnosis Date   Bradycardia    Depression    GERD (gastroesophageal reflux disease)    Hemorrhoids during pregnancy in third trimester    Menometrorrhagia 08/07/2016   Mental disorder    Migraines    Morbid obesity (HCC)    Pelvic pain 07/14/2018   Psoriasis    Past Surgical History:  Procedure Laterality Date   CESAREAN SECTION WITH BILATERAL TUBAL LIGATION Bilateral 07/20/2022   Procedure: CESAREAN SECTION WITH BILATERAL TUBAL LIGATION;  Surgeon: Verdon Keen, MD;  Location: [facility] ORS;  Service: Obstetrics;  Laterality: Bilateral;   INNER EAR SURGERY     right   LAPAROSCOPY N/A 11/19/2018   Procedure: LAPAROSCOPY DIAGNOSTIC AND BIOPSY;  Surgeon: Arloa Lamar SQUIBB, MD;  Location: [facility] ORS;  Service: Gynecology;  Laterality: N/A;   SHOULDER SURGERY     right   Patient Active Problem List   Diagnosis Date Noted   Prolapse of posterior vaginal wall 02/10/2024   Overactive bladder 02/10/2024   Urinary frequency 02/10/2024   SUI (stress urinary incontinence, female) 02/10/2024   Cesarean delivery delivered 07/23/2022   Twin pregnancy, twins dichorionic and diamniotic 07/12/2022   Fetal tachycardia affecting management of mother 07/04/2022   Decreased fetal movement 06/20/2022   Pregnancy 06/17/2022   Elevated BP without diagnosis of hypertension 06/06/2022   Dichorionic diamniotic twin pregnancy in third trimester 02/08/2022   High-risk pregnancy in third trimester 01/08/2022   Normal vaginal delivery of second pregnancy 10/09/2019   Postpartum care following vaginal delivery 10/09/2019   Encounter for induction of labor 10/08/2019   Bleeding external hemorrhoids 10/05/2019   [redacted] weeks gestation of pregnancy 09/27/2019   Headache in pregnancy 09/21/2019   Syncope  08/17/2019   Rectocele affecting obstetric care in second trimester 06/21/2019   Back pain affecting pregnancy in third trimester 06/02/2019   Adult BMI 40.0-44.9 kg/sq m (HCC) 03/29/2019   History of gestational hypertension 02/09/2019   Maternal obesity, antepartum 02/09/2019   Supervision of high risk pregnancy, antepartum 02/09/2019   High-risk pregnancy 02/09/2019   Endosalpingiosis 11/25/2018   Dysmenorrhea 11/13/2018   Pelvic pain 07/14/2018   Abnormal uterine bleeding 07/14/2018   POTS (postural orthostatic tachycardia syndrome) 03/06/2012    PCP: Harvey Bong, NP  REFERRING PROVIDER: Marilynne Rosaline SAILOR, MD  REFERRING DIAG: R10.20 (ICD-10-CM) - Pelvic pain N39.3 (ICD-10-CM) - SUI (stress urinary incontinence, female) N32.81 (ICD-10-CM) - Overactive bladder  THERAPY DIAG:  No diagnosis found.  Rationale for Evaluation and Treatment: Rehabilitation  ONSET DATE: ***  SUBJECTIVE:  SUBJECTIVE STATEMENT: ***  FUNCTIONAL LIMITATIONS: ***  PERTINENT HISTORY:   Pt is a *** yo female who presents with   Problem List:  *** *** ***  Medications for current condition: *** Surgeries: *** Sexual abuse: {Yes/No:304960894}  PAIN:  Are you having pain? {yes/no:20286} NPRS scale: Current ***/10; Best ***/10; Worst ***/10 Pain location: {pelvic pain location:27098}  Pain type: {type:313116} Pain description: {PAIN DESCRIPTION:21022940}   Aggravating factors: *** Relieving factors: ***  PRECAUTIONS: {Therapy precautions:24002}  RED FLAGS: {PT Red Flags:29287}   WEIGHT BEARING RESTRICTIONS: {Yes ***/No:24003}  FALLS:  Has patient fallen in last 6 months? {fallsyesno:27318}  OCCUPATION: ***  ACTIVITY LEVEL : ***  PLOF: {PLOF:24004}  PATIENT GOALS: ***   BOWEL  MOVEMENT: Pain with bowel movement: {yes/no:20286} Constipation: *** Type of bowel movement:{PT BM type:27100::Type (Bristol Stool Scale) ***,Frequency ***,Strain ***} Fully empty rectum: {No/Yes:304960894} Leakage: {Yes/No:304960894}                                                 Bowel urgency: *** Fiber supplement/laxative {YES/NO AS:20300}  URINATION: Leakage: {PT leakage:27103} Urgency: {YES/NO AS:20300} Frequency:during the day ***                                                       Nocturia: {Yes/No:304960894}***   Fully empty bladder: {Yes/No:304960894}***                             Post-void dribble: {YES/NO AS:20300 Stream: {PT urination:27102} Pads/briefs: {Yes/No:304960894}  Fluid intake: ***  INTERCOURSE:  Ability to have vaginal penetration {YES/NO:21197} Pain with intercourse: {pain with intercourse PA:27099} Dryness: {YES/NO AS:20300} Climax: *** Lubricant:  PREGNANCY: Vaginal deliveries *** Tearing {Yes***/No:304960894} Episiotomy {YES/NO AS:20300} C-section deliveries *** Currently pregnant {Yes***/No:304960894}  PROLAPSE: {PT prolapse:27101}   OBJECTIVE:  Note: Objective measures were completed at Evaluation unless otherwise noted.  DIAGNOSTIC FINDINGS:   ***  PATIENT SURVEYS:   PFIQ-7: ***    SENSATION: Light touch: {intact/deficits:24005}  LUMBAR SPECIAL TESTS:  {lumbar special test:25242}  FUNCTIONAL TESTS:  {Functional tests:24029} Single leg stance:  Rt:  Lt: Squat: Bed mobility:  GAIT: Assistive device utilized: {Assistive devices:23999} Comments: ***  POSTURE: {posture:25561} lower abdominal dumping, upper abdominal gripping, forward head, overactive cervical Musculature, iliac crest, shoulder asymetry,  Breathing Assessment: breaths into shoulders and upper chest, early and over activation of cervical accessory muscles, paradoxical pattern of breathing, decreased expansion into diaphragm  ISA/Costal  Mobility: limited, increased ISA bilaterally.  Abdominal: *** Abdominal Activation: *** EO substitution with attempted TrA activation in supine Abdominal Gripping/Dumping: upper abdominal gripping lower abdominal dumping. Diastasis: {Yes/No:304960894}*** Scar tissue: {Yes/No:304960894}*** Active Straight Leg Raise: ***   LUMBARAROM/PROM:  A/PROM A/PROM  Eval (% available)  Flexion   Extension   Right lateral flexion   Left lateral flexion   Right rotation   Left rotation    (Blank rows = not tested)  PALPATION:  General: ***  Pelvic Alignment: ***                External Perineal Exam: ***               Patient confirms identification and approves  PT to assess internal pelvic floor and treatment {yes/no:20286} All internal or external pelvic floor assessments and/or treatments are completed with proper hand hygiene and gloves hands. If needed gloves are changed with hand hygiene during patient care time.  PELVIC Internal:    eval  Vaginal MMT:   PFM ROM: good descent with bulging, good lift with kegel  Perineal Movement with Cough: does not have knack with cough  Tenderness to Palpation:  Muscle and clock, referral?  L Pubococcygeus:  L Iliococcygeus:  L Coccygeus:  L Obturator Internus:  R Pubococcygeus:  R Iliococcygeus:  R Coccygeus:  R Obturator Internus:      Scar tissue:           TONE: ***  PROLAPSE: ***  LOWER EXTREMITY ROM:  {AROM/PROM:27142} ROM Right eval Left eval  Hip flexion    Hip extension    Hip abduction        Hip internal rotation    Hip external rotation    Knee flexion    Knee extension    Ankle dorsiflexion    Ankle plantarflexion    Ankle inversion    Ankle eversion     (Blank rows = not tested)  LOWER EXTREMITY MMT:  MMT Right eval Left eval  Hip flexion /5 /5  Hip extension /5 /5  Hip abduction /5 /5  Hip adduction /5 /5  Hip internal rotation /5 /5  Hip external rotation /5 /5  Knee flexion /5 /5   Knee extension /5 /5  Ankle dorsiflexion /5 /5  Ankle plantarflexion /5 /5  Ankle inversion /5 /5  Ankle eversion /5 /5   (Blank rows = not tested)  TODAY'S TREATMENT:                                                                                                                              DATE: ***  EVAL completed   PATIENT EDUCATION:  Education details: *** Person educated: {Person educated:25204::Patient} Education method: {Education Method:25205::Explanation,Demonstration,Tactile cues,Verbal cues,Handouts} Education comprehension: {Education Comprehension:25206::verbalized understanding}  HOME EXERCISE PROGRAM: ***  ASSESSMENT:  CLINICAL IMPRESSION: Patient is a *** y.o. female who presents with   Pt's musculoskeletal assessment revealed uneven pelvic girdle and shoulder height, asymmetries to gait pattern, limited spinal /pelvic mobility with L thoracic rotation affecting reciprocal arm swing and affecting costal and diaphragmatic biomechanics, poor body mechanics and postural deficits which places strain on the abdominal/pelvic floor mm. These are deficits that indicate an ineffective intraabdominal pressure system associated with increased risk for pt's symptoms. Pt will benefit from propioception/ coordination/ body mechanics training and education with gravity-loaded tasks at work and home and  fitness modifications in order to gain a more effective intraabdominal pressure system to minimize Sx. Regional interdependent approaches including thoracic mobility, costal expansion and coordination of functional slings throughout the body utilized to yield greater benefits in pt's POC due to the complexity of pt's medical Hx and the significant impact their  Sx have had on their QOL. Pt would benefit from a biopsychosocial approach to yield optimal outcomes. Plan to build interdisciplinary team with pt's providers to optimize patient-centered care as needed.    Pt will  benefit from skilled PT in order to address pain, pressure management deficits and weakness and incoordination in pelvic girdle, abdominal musculature and postural muscles.   Pt benefits from skilled PT.   OBJECTIVE IMPAIRMENTS: {opptimpairments:25111}.   ACTIVITY LIMITATIONS: {activitylimitations:27494}  PARTICIPATION LIMITATIONS: {participationrestrictions:25113}  PERSONAL FACTORS: {Personal factors:25162} are also affecting patient's functional outcome.   REHAB POTENTIAL: {rehabpotential:25112}  CLINICAL DECISION MAKING: {clinical decision making:25114}  EVALUATION COMPLEXITY: {Evaluation complexity:25115}   GOALS: Goals reviewed with patient? Yes  SHORT TERM GOALS: Target date: ***   Pt will be consistent and independent with HEP to promote ability to perform daily activities with reduced symptoms. Baseline: Not IND  Goal status: INITIAL   LONG TERM GOALS: Target date: ***  1.Pt will demo proper deep core coordination without chest breathing and optimal excursion of diaphragm/pelvic floor in order to promote spinal stability and pelvic floor function  Baseline: dyscoordination Goal status: INITIAL  2.  Pt will demo proper body mechanics in against gravity tasks and ADLs  work tasks, fitness  to minimize straining pelvic floor / back    Baseline: not IND, improper form that places strain on pelvic floor  Goal status: INITIAL  3.  Baseline:  Goal status: INITIAL  4.  Baseline:  Goal status: INITIAL  5. Pt will improve PFDI-7 questionnaire to  pts  score change  to demo improved QOL  Baseline:    ( greater pts indicate greater negative impact on QOL)     pts  ( total)    pts  ( UIQ-7 )    pts  ( CRAIQ-7 )    pts  ( POPIQ-7 )  Baseline:  Goal status: INITIAL  6.   PLAN:  PT FREQUENCY: {rehab frequency:25116::1x/week}  PT DURATION: {rehab duration:25117::12 weeks}  PLANNED INTERVENTIONS: {rehab planned interventions:25118::97110-Therapeutic  exercises,97530- Therapeutic [phone]- Neuromuscular re-education,97535- Self Rjmz,02859- Manual therapy,[id number]- Electrical stimulation (unattended),97032- Electrical stimulation (manual),20560 (1-2 muscles), 20561 (3+ muscles)- Dry Needling,Patient/Family education,Balance training,Taping,Joint mobilization,Scar mobilization,Cryotherapy,Moist heat,Biofeedback}   PLAN FOR NEXT SESSION: ***   Donna Porter, PT 04/23/2024, 10:45 AM  "

## 2024-04-27 ENCOUNTER — Ambulatory Visit

## 2024-04-30 NOTE — Therapy (Unsigned)
 " OUTPATIENT PHYSICAL THERAPY FEMALE PELVIC EVALUATION   Patient Name: Isabel Higgins MRN: [id number] DOB:1995/08/04, 29 y.o., female Today's Date: 04/30/2024  END OF SESSION:   Past Medical History:  Diagnosis Date   Bradycardia    Depression    GERD (gastroesophageal reflux disease)    Hemorrhoids during pregnancy in third trimester    Menometrorrhagia 08/07/2016   Mental disorder    Migraines    Morbid obesity (HCC)    Pelvic pain 07/14/2018   Psoriasis    Past Surgical History:  Procedure Laterality Date   CESAREAN SECTION WITH BILATERAL TUBAL LIGATION Bilateral 07/20/2022   Procedure: CESAREAN SECTION WITH BILATERAL TUBAL LIGATION;  Surgeon: Verdon Keen, MD;  Location: [facility];  Service: Obstetrics;  Laterality: Bilateral;   INNER EAR SURGERY     right   LAPAROSCOPY N/A 11/19/2018   Procedure: LAPAROSCOPY DIAGNOSTIC AND BIOPSY;  Surgeon: Arloa Lamar SQUIBB, MD;  Location: [facility];  Service: Gynecology;  Laterality: N/A;   SHOULDER SURGERY     right   Patient Active Problem List   Diagnosis Date Noted   Prolapse of posterior vaginal wall 02/10/2024   Overactive bladder 02/10/2024   Urinary frequency 02/10/2024   SUI (stress urinary incontinence, female) 02/10/2024   Cesarean delivery delivered 07/23/2022   Twin pregnancy, twins dichorionic and diamniotic 07/12/2022   Fetal tachycardia affecting management of mother 07/04/2022   Decreased fetal movement 06/20/2022   Pregnancy 06/17/2022   Elevated BP without diagnosis of hypertension 06/06/2022   Dichorionic diamniotic twin pregnancy in third trimester 02/08/2022   High-risk pregnancy in third trimester 01/08/2022   Normal vaginal delivery of second pregnancy 10/09/2019   Postpartum care following vaginal delivery 10/09/2019   Encounter for induction of labor 10/08/2019   Bleeding external hemorrhoids 10/05/2019   [redacted] weeks gestation of pregnancy 09/27/2019   Headache in pregnancy 09/21/2019   Syncope  08/17/2019   Rectocele affecting obstetric care in second trimester 06/21/2019   Back pain affecting pregnancy in third trimester 06/02/2019   Adult BMI 40.0-44.9 kg/sq m (HCC) 03/29/2019   History of gestational hypertension 02/09/2019   Maternal obesity, antepartum 02/09/2019   Supervision of high risk pregnancy, antepartum 02/09/2019   High-risk pregnancy 02/09/2019   Endosalpingiosis 11/25/2018   Dysmenorrhea 11/13/2018   Pelvic pain 07/14/2018   Abnormal uterine bleeding 07/14/2018   POTS (postural orthostatic tachycardia syndrome) 03/06/2012    PCP: Harvey Gaetana CROME, NP  REFERRING PROVIDER: Marilynne Rosaline SAILOR, MD  REFERRING DIAG: R10.20 (ICD-10-CM) - Pelvic pain N39.3 (ICD-10-CM) - SUI (stress urinary incontinence, female) N32.81 (ICD-10-CM) - Overactive bladder  THERAPY DIAG:  No diagnosis found.  Rationale for Evaluation and Treatment: Rehabilitation  ONSET DATE: ***  SUBJECTIVE:  SUBJECTIVE STATEMENT: ***  FUNCTIONAL LIMITATIONS: ***  PERTINENT HISTORY:   Pt is a *** yo female who presents with POP, urinary incontinence and pain with intercourse, loose stools   Problem List:  *** *** ***  Medications for current condition: *** Surgeries: *** Sexual abuse: {Yes/No:304960894}  PAIN:  Are you having pain? {yes/no:20286} NPRS scale: Current ***/10; Best ***/10; Worst ***/10 Pain location: {pelvic pain location:27098}  Pain type: {type:313116} Pain description: {PAIN DESCRIPTION:21022940}   Aggravating factors: *** Relieving factors: ***  Urinary Symptoms: Leaks urine with cough/ sneeze, laughing, lifting, with a full bladder, with movement to the bathroom, and with urgency Leaks 2 time(s) per day. SUI = UUI Pad use: none Patient is bothered by UI symptoms.   Day  time voids 8-10.  Nocturia: 2-3 times per night to void. Voiding dysfunction:  does not empty bladder well.  Patient does not use a catheter to empty bladder.  When urinating, patient feels dribbling after finishing and the need to urinate multiple times in a row Drinks: abt 75oz water per day, occasional soda   UTIs: 1 UTI's in the last year.   Denies history of blood in urine and kidney or bladder stones     Pelvic Organ Prolapse Symptoms:                                       Patient Admits to a feeling of a bulge the vaginal area. It has been present for 7 years - has been worsening since her first delivery.  Patient denies seeing a bulge.  This bulge is bothersome. Feels it all the time. Has difficulty putting a tampon in.  When she was pregnant with her second pregnancy, she did some pelvic PT.    Bowel Symptom: Bowel movements: 5-6 time(s) per day Stool consistency: loose Straining: no.  Splinting: yes.  Incomplete evacuation: yes.  Patient Admits to accidental bowel leakage / fecal incontinence             Occurs: has happened maybe 1-2 times             Consistency with leakage: liquid Bowel regimen: none Last colonoscopy: never had one Has referral to GI pending   Sexual Function Sexually active: yes.  Sexual orientation: heterosexual Pain with sex: Yes, deep in the pelvis, has discomfort due to prolapse   Pelvic Pain Admits to pelvic pain- started after her last delivery Location: around uterus and ovaries, also around the tailbone and radiates around Pain occurs: all the time Prior pain treatment: none Improved by: nothing Worsened by: standing/ sitting She has seen a chiropractor without improvement.   BOWEL MOVEMENT: Pain with bowel movement: {yes/no:20286} Constipation: *** Type of bowel movement:{PT BM type:27100::Type (Bristol Stool Scale) ***,Frequency ***,Strain ***} Fully empty rectum: {No/Yes:304960894} Leakage: {Yes/No:304960894}                                                  Bowel urgency: *** Fiber supplement/laxative {YES/NO AS:20300}  URINATION: Leakage: {PT leakage:27103} Urgency: {YES/NO AS:20300} Frequency:during the day ***  Nocturia: {Yes/No:304960894}***   Fully empty bladder: {Yes/No:304960894}***                             Post-void dribble: {YES/NO AS:20300} Stream: {PT urination:27102} Pads/briefs: {Yes/No:304960894}  Fluid intake: ***  INTERCOURSE:  Ability to have vaginal penetration {YES/NO:21197} Pain with intercourse: {pain with intercourse PA:27099} Dryness: {YES/NO AS:20300} Climax: *** Lubricant:  PREGNANCY: Vaginal deliveries *** Tearing {Yes***/No:304960894} Episiotomy {YES/NO AS:20300} C-section deliveries *** Currently pregnant {Yes***/No:304960894}  PROLAPSE: {PT prolapse:27101}   PRECAUTIONS: {Therapy precautions:24002}  RED FLAGS: {PT Red Flags:29287}   WEIGHT BEARING RESTRICTIONS: {Yes ***/No:24003}  FALLS:  Has patient fallen in last 6 months? {fallsyesno:27318}  OCCUPATION: ***  ACTIVITY LEVEL : ***  PLOF: {PLOF:24004}  PATIENT GOALS: ***   OBJECTIVE:  Note: Objective measures were completed at Evaluation unless otherwise noted.  DIAGNOSTIC FINDINGS:   ***  PATIENT SURVEYS:   PFIQ-7: ***    SENSATION: Light touch: {intact/deficits:24005}  LUMBAR SPECIAL TESTS:  {lumbar special test:25242}  FUNCTIONAL TESTS:  {Functional tests:24029} Single leg stance:  Rt:  Lt: Squat: Bed mobility:  GAIT: Assistive device utilized: {Assistive devices:23999} Comments: ***  POSTURE: {posture:25561} lower abdominal dumping, upper abdominal gripping, forward head, overactive cervical Musculature, iliac crest, shoulder asymetry,  Breathing Assessment: breaths into shoulders and upper chest, early and over activation of cervical accessory muscles, paradoxical pattern of breathing, decreased expansion into  diaphragm  ISA/Costal Mobility: limited, increased ISA bilaterally.  Abdominal: *** Abdominal Activation: *** EO substitution with attempted TrA activation in supine Abdominal Gripping/Dumping: upper abdominal gripping lower abdominal dumping. Diastasis: {Yes/No:304960894}*** Scar tissue: {Yes/No:304960894}*** Active Straight Leg Raise: ***   LUMBARAROM/PROM:  A/PROM A/PROM  Eval (% available)  Flexion   Extension   Right lateral flexion   Left lateral flexion   Right rotation   Left rotation    (Blank rows = not tested)  PALPATION:  General: ***  Pelvic Alignment: ***                External Perineal Exam: ***               Patient confirms identification and approves PT to assess internal pelvic floor and treatment {yes/no:20286} All internal or external pelvic floor assessments and/or treatments are completed with proper hand hygiene and gloves hands. If needed gloves are changed with hand hygiene during patient care time.  PELVIC Internal:    eval  Vaginal MMT:   PFM ROM: good descent with bulging, good lift with kegel  Perineal Movement with Cough: does not have knack with cough  Tenderness to Palpation:  Muscle and clock, referral?  L Pubococcygeus:  L Iliococcygeus:  L Coccygeus:  L Obturator Internus:  R Pubococcygeus:  R Iliococcygeus:  R Coccygeus:  R Obturator Internus:      Scar tissue:           TONE: ***  PROLAPSE: ***  LOWER EXTREMITY ROM:  {AROM/PROM:27142} ROM Right eval Left eval  Hip flexion    Hip extension    Hip abduction        Hip internal rotation    Hip external rotation    Knee flexion    Knee extension    Ankle dorsiflexion    Ankle plantarflexion    Ankle inversion    Ankle eversion     (Blank rows = not tested)  LOWER EXTREMITY MMT:  MMT Right eval Left eval  Hip flexion /5 /5  Hip extension /5 /  5  Hip abduction /5 /5  Hip adduction /5 /5  Hip internal rotation /5 /5  Hip external rotation /5 /5   Knee flexion /5 /5  Knee extension /5 /5  Ankle dorsiflexion /5 /5  Ankle plantarflexion /5 /5  Ankle inversion /5 /5  Ankle eversion /5 /5   (Blank rows = not tested)  TODAY'S TREATMENT:                                                                                                                              DATE: ***  EVAL completed   PATIENT EDUCATION:  Education details: *** Person educated: {Person educated:25204::Patient} Education method: {Education Method:25205::Explanation,Demonstration,Tactile cues,Verbal cues,Handouts} Education comprehension: {Education Comprehension:25206::verbalized understanding}  HOME EXERCISE PROGRAM: ***  ASSESSMENT:  CLINICAL IMPRESSION: Patient is a *** y.o. female who presents with   Pt's musculoskeletal assessment revealed uneven pelvic girdle and shoulder height, asymmetries to gait pattern, limited spinal /pelvic mobility with L thoracic rotation affecting reciprocal arm swing and affecting costal and diaphragmatic biomechanics, poor body mechanics and postural deficits which places strain on the abdominal/pelvic floor mm. These are deficits that indicate an ineffective intraabdominal pressure system associated with increased risk for pt's symptoms. Pt will benefit from propioception/ coordination/ body mechanics training and education with gravity-loaded tasks at work and home and  fitness modifications in order to gain a more effective intraabdominal pressure system to minimize Sx. Regional interdependent approaches including thoracic mobility, costal expansion and coordination of functional slings throughout the body utilized to yield greater benefits in pt's POC due to the complexity of pt's medical Hx and the significant impact their Sx have had on their QOL. Pt would benefit from a biopsychosocial approach to yield optimal outcomes. Plan to build interdisciplinary team with pt's providers to optimize patient-centered care as  needed.    Pt will benefit from skilled PT in order to address pain, pressure management deficits and weakness and incoordination in pelvic girdle, abdominal musculature and postural muscles.   Pt benefits from skilled PT.   OBJECTIVE IMPAIRMENTS: {opptimpairments:25111}.   ACTIVITY LIMITATIONS: {activitylimitations:27494}  PARTICIPATION LIMITATIONS: {participationrestrictions:25113}  PERSONAL FACTORS: {Personal factors:25162} are also affecting patient's functional outcome.   REHAB POTENTIAL: {rehabpotential:25112}  CLINICAL DECISION MAKING: {clinical decision making:25114}  EVALUATION COMPLEXITY: {Evaluation complexity:25115}   GOALS: Goals reviewed with patient? Yes  SHORT TERM GOALS: Target date: ***   Pt will be consistent and independent with HEP to promote ability to perform daily activities with reduced symptoms. Baseline: Not IND  Goal status: INITIAL   LONG TERM GOALS: Target date: ***  1.Pt will demo proper deep core coordination without chest breathing and optimal excursion of diaphragm/pelvic floor in order to promote spinal stability and pelvic floor function  Baseline: dyscoordination Goal status: INITIAL  2.  Pt will demo proper body mechanics in against gravity tasks and ADLs  work tasks, fitness  to minimize straining pelvic floor / back    Baseline: not IND, improper  form that places strain on pelvic floor  Goal status: INITIAL  3.  Baseline:  Goal status: INITIAL  4.  Baseline:  Goal status: INITIAL  5. Pt will improve PFDI-7 questionnaire to  pts  score change  to demo improved QOL  Baseline:    ( greater pts indicate greater negative impact on QOL)     pts  ( total)    pts  ( UIQ-7 )    pts  ( CRAIQ-7 )    pts  ( POPIQ-7 )  Baseline:  Goal status: INITIAL  6.   PLAN:  PT FREQUENCY: {rehab frequency:25116::1x/week}  PT DURATION: {rehab duration:25117::12 weeks}  PLANNED INTERVENTIONS: {rehab planned  interventions:25118::97110-Therapeutic exercises,97530- Therapeutic [phone]- Neuromuscular re-education,97535- Self Rjmz,02859- Manual therapy,[id number]- Electrical stimulation (unattended),97032- Electrical stimulation (manual),20560 (1-2 muscles), 20561 (3+ muscles)- Dry Needling,Patient/Family education,Balance training,Taping,Joint mobilization,Scar mobilization,Cryotherapy,Moist heat,Biofeedback}   PLAN FOR NEXT SESSION: PIERRETTE Donna Porter, PT 04/30/2024, 10:47 AM  "

## 2024-05-04 ENCOUNTER — Ambulatory Visit

## 2024-05-11 ENCOUNTER — Ambulatory Visit

## 2024-05-18 ENCOUNTER — Ambulatory Visit

## 2024-05-25 ENCOUNTER — Ambulatory Visit

## 2024-06-01 ENCOUNTER — Ambulatory Visit

## 2024-06-08 ENCOUNTER — Ambulatory Visit: Admitting: Obstetrics and Gynecology
